# Patient Record
Sex: Female | Born: 1955 | Race: Black or African American | Hispanic: No | Marital: Married | Smoking: Never smoker
Health system: Southern US, Community
[De-identification: ages and names within clinical notes are randomized; demographics above are authoritative.]

## PROBLEM LIST (undated history)

## (undated) VITALS — BP 135/58 | HR 68 | Temp 97.2°F | Resp 18

## (undated) DIAGNOSIS — M810 Age-related osteoporosis without current pathological fracture: Secondary | ICD-10-CM

## (undated) DIAGNOSIS — M8580 Other specified disorders of bone density and structure, unspecified site: Secondary | ICD-10-CM

## (undated) DIAGNOSIS — D649 Anemia, unspecified: Secondary | ICD-10-CM

## (undated) DIAGNOSIS — Z87891 Personal history of nicotine dependence: Secondary | ICD-10-CM

## (undated) DIAGNOSIS — F419 Anxiety disorder, unspecified: Secondary | ICD-10-CM

## (undated) DIAGNOSIS — D689 Coagulation defect, unspecified: Secondary | ICD-10-CM

## (undated) DIAGNOSIS — E785 Hyperlipidemia, unspecified: Secondary | ICD-10-CM

## (undated) DIAGNOSIS — K219 Gastro-esophageal reflux disease without esophagitis: Secondary | ICD-10-CM

## (undated) DIAGNOSIS — F32A Depression, unspecified: Secondary | ICD-10-CM

## (undated) DIAGNOSIS — E119 Type 2 diabetes mellitus without complications: Secondary | ICD-10-CM

## (undated) DIAGNOSIS — E669 Obesity, unspecified: Secondary | ICD-10-CM

## (undated) DIAGNOSIS — Z9889 Other specified postprocedural states: Secondary | ICD-10-CM

## (undated) DIAGNOSIS — Z124 Encounter for screening for malignant neoplasm of cervix: Secondary | ICD-10-CM

## (undated) DIAGNOSIS — R112 Nausea with vomiting, unspecified: Secondary | ICD-10-CM

## (undated) DIAGNOSIS — Z789 Other specified health status: Secondary | ICD-10-CM

## (undated) HISTORY — DX: Personal history of nicotine dependence: Z87.891

## (undated) HISTORY — DX: Age-related osteoporosis without current pathological fracture: M81.0

## (undated) HISTORY — DX: Gastro-esophageal reflux disease without esophagitis: K21.9

## (undated) HISTORY — DX: Anemia, unspecified: D64.9

## (undated) HISTORY — DX: Other specified disorders of bone density and structure, unspecified site: M85.80

## (undated) HISTORY — DX: Depression, unspecified: F32.A

## (undated) HISTORY — PX: TOTAL ABDOMINAL HYSTERECTOMY: SHX209

## (undated) HISTORY — DX: Hyperlipidemia, unspecified: E78.5

## (undated) HISTORY — DX: Anxiety disorder, unspecified: F41.9

## (undated) HISTORY — DX: Coagulation defect, unspecified: D68.9

## (undated) HISTORY — PX: HYSTERECTOMY: SHX81

## (undated) HISTORY — DX: Obesity, unspecified: E66.9

## (undated) HISTORY — PX: OOPHORECTOMY: SHX86

## (undated) HISTORY — DX: Encounter for screening for malignant neoplasm of cervix: Z12.4

## (undated) NOTE — Progress Notes (Signed)
Formatting of this note is different from the original.  Images from the original note were not included.    Cardiology Consult     Patient: [name] [name]  DOB: [birth year] Age: 8 y.o.  PCP: Rae Lips    Problem List:     There is no problem list on file for this patient.    History of Present Illness:     Patient is a 35 y.o. African American female with HTN, Hyperlipidemia, nonobstructive CAD by LHC in 2010, breast cancer in 2021, presenting today for followup for BP check.  Patient states that her BPs have improved on her checks but still in thd 140s-150s systolic. She denies any further chest pains since stopping Spironolactone.  She is able to do her routine physical activities.  She denies SOB, N/V, diaphoresis, palpitations, orthopnea, PND, or LE swelling.    Past Medical History:     Past Medical History:   Diagnosis Date    Breast cancer 2021    s/p lumpectomy (left breast, stage Zero).  No chemotherapy or XRT.    Coronary artery disease     Nonobstructive CAD (LHC 2010)    Hypercholesterolemia     Hyperlipidemia     Hypertension      Past Surgical History:     Past Surgical History:   Procedure Laterality Date    BREAST LUMPECTOMY Left 2021    CARDIAC CATHETERIZATION  2010    Nonobstructive CAD.    COLONOSCOPY N/A 06/13/2022    COLONOSCOPY performed by Darlina Sicilian, MD at [facility] ENDOSCOPY    HYSTERECTOMY  04/2018    Uterine fibroids     Family History:     Family History   Problem Relation Age of Onset    Heart Attack Brother 49.00    Heart Attack Sister 53.00     Social History:     Social History     Socioeconomic History    Marital status: Married     Spouse name: None    Number of children: None    Years of education: None    Highest education level: None   Tobacco Use    Smoking status: Never    Smokeless tobacco: Never   Vaping Use    Vaping Use: Never used   Substance and Sexual Activity    Alcohol use: Yes     Comment: occas    Drug use: Never     Home Medications:     Current  Outpatient Medications   Medication Instructions    ascorbic acid (VITAMIN C) 1,000 mg, Oral    carvedilol (COREG) 25 mg, Oral, 2 TIMES DAILY WITH MEALS    hydroCHLOROthiazide (MICROZIDE) 12.5 mg, Oral, DAILY    losartan (COZAAR) 50 mg, Oral, DAILY    vitamin D3 (CHOLECALCIFEROL) 50 MCG (2000 UT) CAPS Oral     Allergies:     Allergies   Allergen Reactions    Aspirin Rash and Shortness Of Breath    Diphenhydramine Shortness Of Breath    Levofloxacin Other (See Comments)     Joint pains    Pravastatin Other (See Comments)     Myalgias    Tetanus Immune Globulin Other (See Comments)     Skin reaction    Atorvastatin Other (See Comments)    Other Swelling     Antihistamines    Spironolactone Other (See Comments)     pain    Tetanus-Diphth-Acell Pertussis Other (See Comments)     ok  with Tetanus but must have shot in left arm      Review of Systems:     Gastrointestinal ROS: no abdominal pain, change in bowel habits, or black or bloody stools  Genito-Urinary ROS: no dysuria, trouble voiding, or hematuria  Hematological and Lymphatic ROS: negative  Endocrine ROS: negative  Neurological ROS: no TIA or stroke symptoms  Otherwise negative    Physical Exam:     Vital Signs: Visit Vitals  BP (!) 154/84 (BP Location: Left arm, Patient Position: Sitting, BP Cuff Size: Adult)   Pulse 76   Resp 16   Ht 1.626 m (5\' 4" )   Wt 98 kg (216 lb)   SpO2 97%   BMI 37.08 kg/m     Constitutional:  No acute distress, non-toxic appearance  Neurologic:  Alert, orient x 3.  Psychiatric:  No apparent distress, not anxious.  Cardiovascular:  Regular rate and rhythm. Normal S1/S2, no S3/S4.  1/6 systolic murmur.  No rub.  Respiratory:  Clear to auscultation.  Extremities:  No edema    ECG:   Results for orders placed or performed in visit on 04/17/22   EKG 12 Lead    Impression    Normal sinus rhythm, nonspecific T wave abnormality.     Echo 09/2021:  Normal LV systolic function with estimated EF of 60-65%.   Trace tricuspid regurgitation with  normal estimated RV systolic pressures at 32 mmHg.    Grade 1 diastolic dysfunction.  Compared to echo report done on 10/01/2019, no signficant changes are identified.    Nuclear stress 09/2021:    Assessment:     1. ASCVD (arteriosclerotic cardiovascular disease)    2. Essential hypertension    3. Hyperlipidemia, unspecified hyperlipidemia type      Plan:     Patient's BPs are slightly better but still mildly elevated with Losartan HCTZ additions.  She denies any further chest pains since stopping Spironolactone.  I will increase Losartan to 50mg  qday, and she will continue Carvedilol, and HCTZ.  I will hold off on an ischemic workup at this point since she is asymptomatic.  Continue risk factor modification.  She is encouraged to monitor and record her BPs for review.  We will recheck echo in 4 months to reassess LV systolic function and valvular abnormalities.      Followup:     Followup with me in 4 months, after echo.    Liana Crocker, MD  [facility]  Electronically signed 12/19/2022 / 15:33    Electronically signed by Liana Crocker, MD at 12/19/2022  3:33 PM EDT

---

## 1982-07-15 HISTORY — PX: TUBAL LIGATION: SHX77

## 1994-08-22 ENCOUNTER — Ambulatory Visit
Admit: 1994-08-22 | Disposition: A | Discharge status: Home / Self Care | Payer: Self-pay | Source: Ambulatory Visit | Admitting: Adult Medicine

## 1995-12-03 ENCOUNTER — Ambulatory Visit
Admit: 1995-12-03 | Disposition: A | Discharge status: Home / Self Care | Payer: Self-pay | Source: Ambulatory Visit | Admitting: Adult Medicine

## 1996-03-17 ENCOUNTER — Ambulatory Visit
Admit: 1996-03-17 | Disposition: A | Discharge status: Home / Self Care | Payer: Self-pay | Source: Ambulatory Visit | Admitting: Obstetrics & Gynecology

## 1997-07-15 HISTORY — PX: ABDOMINAL SURGERY: SHX537

## 1998-01-31 ENCOUNTER — Encounter: Admission: RE | Admit: 1998-01-31 | Discharge: 1998-05-01 | Payer: Self-pay | Admitting: Family Medicine

## 1998-05-03 ENCOUNTER — Emergency Department: Admit: 1998-05-03 | Payer: Self-pay | Source: Emergency Department | Admitting: Emergency Medicine

## 1998-07-15 DIAGNOSIS — D689 Coagulation defect, unspecified: Secondary | ICD-10-CM

## 1998-07-15 HISTORY — DX: Coagulation defect, unspecified: D68.9

## 1999-03-31 ENCOUNTER — Emergency Department (HOSPITAL_COMMUNITY): Admission: EM | Admit: 1999-03-31 | Discharge: 1999-03-31 | Payer: Self-pay | Admitting: Emergency Medicine

## 1999-10-13 ENCOUNTER — Ambulatory Visit
Admit: 1999-10-13 | Disposition: A | Discharge status: Home / Self Care | Payer: Self-pay | Source: Ambulatory Visit | Admitting: Obstetrics & Gynecology

## 1999-12-10 ENCOUNTER — Emergency Department (HOSPITAL_COMMUNITY): Admission: EM | Admit: 1999-12-10 | Discharge: 1999-12-10 | Payer: Self-pay | Admitting: Emergency Medicine

## 1999-12-10 ENCOUNTER — Encounter: Payer: Self-pay | Admitting: Emergency Medicine

## 2000-04-14 ENCOUNTER — Emergency Department (HOSPITAL_COMMUNITY): Admission: EM | Admit: 2000-04-14 | Discharge: 2000-04-14 | Payer: Self-pay | Admitting: Emergency Medicine

## 2000-08-13 ENCOUNTER — Other Ambulatory Visit: Admission: RE | Admit: 2000-08-13 | Discharge: 2000-08-13 | Payer: Self-pay | Admitting: Obstetrics and Gynecology

## 2001-09-25 ENCOUNTER — Other Ambulatory Visit: Admission: RE | Admit: 2001-09-25 | Discharge: 2001-09-25 | Payer: Self-pay | Admitting: Obstetrics and Gynecology

## 2002-01-01 ENCOUNTER — Ambulatory Visit
Admit: 2002-01-01 | Disposition: A | Discharge status: Home / Self Care | Payer: Self-pay | Source: Ambulatory Visit | Admitting: Obstetrics & Gynecology

## 2002-01-02 ENCOUNTER — Ambulatory Visit
Admit: 2002-01-02 | Disposition: A | Discharge status: Home / Self Care | Payer: Self-pay | Source: Ambulatory Visit | Admitting: Obstetrics & Gynecology

## 2002-01-09 ENCOUNTER — Ambulatory Visit
Admit: 2002-01-09 | Disposition: A | Discharge status: Home / Self Care | Payer: Self-pay | Source: Ambulatory Visit | Admitting: Obstetrics & Gynecology

## 2002-03-20 ENCOUNTER — Ambulatory Visit: Admit: 2002-03-20 | Disposition: A | Discharge status: Home / Self Care | Payer: Self-pay | Source: Ambulatory Visit

## 2002-07-15 DIAGNOSIS — I10 Essential (primary) hypertension: Secondary | ICD-10-CM

## 2002-07-15 HISTORY — DX: Essential (primary) hypertension: I10

## 2002-07-15 HISTORY — PX: INGUINAL HERNIA REPAIR: SUR1180

## 2002-09-02 ENCOUNTER — Inpatient Hospital Stay
Admission: EM | Admit: 2002-09-02 | Disposition: A | Discharge status: Home / Self Care | Payer: Self-pay | Source: Emergency Department | Admitting: Family Medicine

## 2003-06-08 ENCOUNTER — Ambulatory Visit
Admit: 2003-06-08 | Disposition: A | Discharge status: Home / Self Care | Payer: Self-pay | Source: Ambulatory Visit | Admitting: Obstetrics & Gynecology

## 2003-07-27 ENCOUNTER — Other Ambulatory Visit: Admission: RE | Admit: 2003-07-27 | Discharge: 2003-07-27 | Payer: Self-pay | Admitting: Obstetrics and Gynecology

## 2003-09-01 ENCOUNTER — Ambulatory Visit
Admit: 2003-09-01 | Disposition: A | Discharge status: Home / Self Care | Payer: Self-pay | Source: Ambulatory Visit | Admitting: Obstetrics & Gynecology

## 2004-05-08 ENCOUNTER — Ambulatory Visit
Admit: 2004-05-08 | Disposition: A | Discharge status: Home / Self Care | Payer: Self-pay | Source: Ambulatory Visit | Admitting: Obstetrics & Gynecology

## 2005-01-09 ENCOUNTER — Other Ambulatory Visit: Admission: RE | Admit: 2005-01-09 | Discharge: 2005-01-09 | Payer: Self-pay | Admitting: Obstetrics and Gynecology

## 2005-04-26 ENCOUNTER — Ambulatory Visit: Payer: Self-pay | Admitting: Internal Medicine

## 2005-05-27 ENCOUNTER — Ambulatory Visit: Payer: Self-pay | Admitting: Internal Medicine

## 2005-05-29 ENCOUNTER — Ambulatory Visit: Payer: Self-pay | Admitting: Internal Medicine

## 2005-08-12 ENCOUNTER — Ambulatory Visit: Payer: Self-pay | Admitting: Internal Medicine

## 2006-01-10 ENCOUNTER — Ambulatory Visit
Admit: 2006-01-10 | Disposition: A | Discharge status: Home / Self Care | Payer: Self-pay | Source: Ambulatory Visit | Admitting: Obstetrics and Gynecology

## 2006-03-18 ENCOUNTER — Ambulatory Visit: Payer: Self-pay | Admitting: Internal Medicine

## 2006-11-12 ENCOUNTER — Ambulatory Visit: Payer: Self-pay | Admitting: Internal Medicine

## 2007-03-18 ENCOUNTER — Ambulatory Visit: Payer: Self-pay | Admitting: Gastroenterology

## 2007-04-01 ENCOUNTER — Encounter: Payer: Self-pay | Admitting: Gastroenterology

## 2007-04-01 ENCOUNTER — Ambulatory Visit: Payer: Self-pay | Admitting: Gastroenterology

## 2007-04-01 LAB — HM COLONOSCOPY: HM Colonoscopy: ABNORMAL

## 2007-06-08 ENCOUNTER — Ambulatory Visit: Payer: Self-pay | Admitting: Internal Medicine

## 2007-06-08 DIAGNOSIS — R233 Spontaneous ecchymoses: Secondary | ICD-10-CM | POA: Insufficient documentation

## 2007-06-08 DIAGNOSIS — E785 Hyperlipidemia, unspecified: Secondary | ICD-10-CM | POA: Insufficient documentation

## 2007-06-12 DIAGNOSIS — I82409 Acute embolism and thrombosis of unspecified deep veins of unspecified lower extremity: Secondary | ICD-10-CM | POA: Insufficient documentation

## 2007-06-12 DIAGNOSIS — D649 Anemia, unspecified: Secondary | ICD-10-CM | POA: Insufficient documentation

## 2007-06-12 DIAGNOSIS — F17200 Nicotine dependence, unspecified, uncomplicated: Secondary | ICD-10-CM | POA: Insufficient documentation

## 2007-06-15 LAB — CONVERTED CEMR LAB
Calcium: 9.5 mg/dL (ref 8.4–10.5)
Cholesterol: 305 mg/dL (ref 0–200)
Eosinophils Absolute: 0.1 10*3/uL (ref 0.0–0.6)
Eosinophils Relative: 1.4 % (ref 0.0–5.0)
GFR calc Af Amer: 85 mL/min
GFR calc non Af Amer: 70 mL/min
Glucose, Bld: 96 mg/dL (ref 70–99)
HDL: 72.2 mg/dL (ref >39.0)
Lymphocytes Relative: 51.5 % — ABNORMAL HIGH (ref 12.0–46.0)
MCV: 92.4 fL (ref 78.0–100.0)
Monocytes Relative: 10.2 % (ref 3.0–11.0)
Neutro Abs: 1.5 10*3/uL (ref 1.4–7.7)
Platelets: 306 10*3/uL (ref 150–400)
TSH: 0.59 microintl units/mL (ref 0.35–5.50)
Total CHOL/HDL Ratio: 4.2
Triglycerides: 50 mg/dL (ref 0–149)
WBC: 4.2 10*3/uL — ABNORMAL LOW (ref 4.5–10.5)

## 2007-07-02 ENCOUNTER — Telehealth: Payer: Self-pay | Admitting: Internal Medicine

## 2007-07-02 ENCOUNTER — Ambulatory Visit: Payer: Self-pay | Admitting: Internal Medicine

## 2007-07-10 ENCOUNTER — Emergency Department (HOSPITAL_COMMUNITY): Admission: EM | Admit: 2007-07-10 | Discharge: 2007-07-10 | Payer: Self-pay | Admitting: Emergency Medicine

## 2007-09-18 ENCOUNTER — Ambulatory Visit
Admit: 2007-09-18 | Disposition: A | Discharge status: Home / Self Care | Payer: Self-pay | Source: Ambulatory Visit | Admitting: Obstetrics and Gynecology

## 2007-10-29 ENCOUNTER — Ambulatory Visit: Payer: Self-pay | Admitting: Internal Medicine

## 2007-10-29 LAB — CONVERTED CEMR LAB
ALT: 13 units/L (ref 0–35)
AST: 17 units/L (ref 0–37)
LDL Cholesterol: 102 mg/dL — ABNORMAL HIGH (ref 0–99)
VLDL: 9 mg/dL (ref 0–40)

## 2007-10-30 ENCOUNTER — Ambulatory Visit: Payer: Self-pay | Admitting: Internal Medicine

## 2008-07-15 DIAGNOSIS — I517 Cardiomegaly: Secondary | ICD-10-CM

## 2008-07-15 DIAGNOSIS — I2510 Atherosclerotic heart disease of native coronary artery without angina pectoris: Secondary | ICD-10-CM

## 2008-07-15 HISTORY — PX: CARDIAC CATHETERIZATION: SHX172

## 2008-07-15 HISTORY — DX: Cardiomegaly: I51.7

## 2008-07-15 HISTORY — DX: Atherosclerotic heart disease of native coronary artery without angina pectoris: I25.10

## 2008-10-13 ENCOUNTER — Ambulatory Visit: Payer: Self-pay | Admitting: Internal Medicine

## 2008-10-13 DIAGNOSIS — L680 Hirsutism: Secondary | ICD-10-CM | POA: Insufficient documentation

## 2008-10-13 DIAGNOSIS — R109 Unspecified abdominal pain: Secondary | ICD-10-CM | POA: Insufficient documentation

## 2008-10-19 ENCOUNTER — Ambulatory Visit: Payer: Self-pay | Admitting: Internal Medicine

## 2008-10-19 LAB — CONVERTED CEMR LAB
Albumin: 4.2 g/dL (ref 3.5–5.2)
BUN: 9 mg/dL (ref 6–23)
Basophils Absolute: 0 10*3/uL (ref 0.0–0.1)
Calcium: 9.8 mg/dL (ref 8.4–10.5)
Cholesterol: 193 mg/dL (ref 0–200)
Creatinine, Ser: 0.9 mg/dL (ref 0.4–1.2)
Eosinophils Relative: 1.6 % (ref 0.0–5.0)
GFR calc non Af Amer: 84.4 mL/min (ref >60)
Glucose, Bld: 93 mg/dL (ref 70–99)
HCT: 36.5 % (ref 36.0–46.0)
HDL: 71.2 mg/dL (ref >39.00)
Lymphs Abs: 1.7 10*3/uL (ref 0.7–4.0)
MCV: 93.7 fL (ref 78.0–100.0)
Monocytes Absolute: 0.2 10*3/uL (ref 0.1–1.0)
Neutro Abs: 1.4 10*3/uL (ref 1.4–7.7)
Platelets: 268 10*3/uL (ref 150.0–400.0)
Prolactin: 4.8 ng/mL
RDW: 13.6 % (ref 11.5–14.6)
VLDL: 10.2 mg/dL (ref 0.0–40.0)

## 2008-10-20 ENCOUNTER — Encounter: Payer: Self-pay | Admitting: Internal Medicine

## 2008-10-31 ENCOUNTER — Observation Stay
Admission: EM | Admit: 2008-10-31 | Disposition: A | Discharge status: Other Acute Inpatient Hospital | Payer: Self-pay | Source: Emergency Department | Admitting: Internal Medicine

## 2008-10-31 LAB — HEPATIC FUNCTION PANEL
ALT: 22 U/L (ref 7–56)
AST (SGOT): 29 U/L (ref 5–40)
Albumin/Globulin Ratio: 1.2 (ref 1.1–1.8)
Albumin: 4.4 G/DL (ref 3.7–5.1)
Alkaline Phosphatase: 72 U/L (ref 43–122)
Bilirubin Direct: 0.2 MG/DL (ref 0.0–0.3)
Bilirubin Indirect: 0.3 MG/DL (ref 0.0–1.1)
Bilirubin, Total: 0.5 MG/DL (ref 0.2–1.3)
Globulin: 3.6 G/DL (ref 2.0–3.7)
Protein, Total: 8 G/DL (ref 6.0–8.0)

## 2008-10-31 LAB — BASIC METABOLIC PANEL
BUN: 11 MG/DL (ref 7–21)
CO2: 29 MEQ/L (ref 22–31)
Calcium: 10.1 MG/DL (ref 8.6–10.2)
Chloride: 106 MEQ/L (ref 98–107)
Creatinine: 0.9 MG/DL (ref 0.5–1.4)
Glucose: 112 MG/DL — ABNORMAL HIGH (ref 65–110)
Potassium: 4.3 MEQ/L (ref 3.6–5.0)
Sodium: 142 MEQ/L (ref 136–143)

## 2008-10-31 LAB — CBC AND DIFFERENTIAL
Basophils Absolute: 0 /mm3 (ref 0.0–0.2)
Basophils: 1 % (ref 0–2)
Eosinophils Absolute: 0.1 /mm3 (ref 0.0–0.7)
Eosinophils: 3 % (ref 0–5)
Granulocytes Absolute: 2.4 /mm3 (ref 1.8–8.1)
Hematocrit: 31.9 % — ABNORMAL LOW (ref 37.0–47.0)
Hgb: 9.9 G/DL — ABNORMAL LOW (ref 12.0–16.0)
Immature Granulocytes Absolute: 0.1 CUMM — ABNORMAL HIGH (ref 0.0–0.0)
Immature Granulocytes: 2 % — ABNORMAL HIGH (ref 0–1)
Lymphocytes Absolute: 1.2 /mm3 (ref 0.5–4.4)
Lymphocytes: 29 % (ref 15–41)
MCH: 23.2 PG — ABNORMAL LOW (ref 28.0–32.0)
MCHC: 31 G/DL — ABNORMAL LOW (ref 32.0–36.0)
MCV: 74.9 FL — ABNORMAL LOW (ref 80.0–100.0)
MPV: 12.1 FL (ref 9.4–12.3)
Monocytes Absolute: 0.3 /mm3 (ref 0.0–1.2)
Monocytes: 8 % (ref 0–11)
Neutrophils %: 60 % (ref 52–75)
Platelets: 184 /mm3 (ref 140–400)
RBC: 4.26 /mm3 (ref 4.20–5.40)
RDW: 15.5 % — ABNORMAL HIGH (ref 11.5–15.0)
WBC: 4.06 /mm3 (ref 3.50–10.80)

## 2008-10-31 LAB — GFR

## 2008-10-31 LAB — PT/INR
PT INR: 1.2 {INR} — ABNORMAL HIGH (ref 0.9–1.1)
PT: 13.6 s — ABNORMAL HIGH (ref 10.8–13.3)

## 2008-10-31 LAB — TROPONIN I QUANTITATIVE LEVEL - IFOH CERNER
Troponin I Quant: <0.01 NG/ML
Troponin I Quant: <0.01 NG/ML

## 2008-10-31 LAB — D-DIMER (DVT) CERNER: D-Dimer (DVT): 748 ng mL — ABNORMAL HIGH (ref 0–600)

## 2008-10-31 LAB — LIPASE: Lipase: 56 U/L (ref 23–300)

## 2008-10-31 LAB — CK: Creatine Kinase (CK): 76 U/L (ref 20–140)

## 2008-10-31 LAB — CREATINE KINASE W/O REFLEX (SOFT): Creatine Kinase (CK): 53 U/L (ref 20–140)

## 2008-11-01 ENCOUNTER — Ambulatory Visit: Payer: Self-pay | Admitting: Internal Medicine

## 2008-11-01 ENCOUNTER — Ambulatory Visit: Payer: Self-pay | Admitting: Diagnostic Radiology

## 2008-11-01 ENCOUNTER — Telehealth: Payer: Self-pay | Admitting: Internal Medicine

## 2008-11-01 ENCOUNTER — Encounter: Payer: Self-pay | Admitting: Internal Medicine

## 2008-11-01 ENCOUNTER — Ambulatory Visit (HOSPITAL_BASED_OUTPATIENT_CLINIC_OR_DEPARTMENT_OTHER): Admission: RE | Admit: 2008-11-01 | Discharge: 2008-11-01 | Payer: Self-pay | Admitting: Internal Medicine

## 2008-11-01 LAB — TROPONIN I QUANTITATIVE LEVEL - IFOH CERNER: Troponin I Quant: <0.01 NG/ML

## 2008-11-01 LAB — CREATINE KINASE W/O REFLEX (SOFT): Creatine Kinase (CK): 44 U/L (ref 20–140)

## 2008-11-02 ENCOUNTER — Inpatient Hospital Stay
Admission: AD | Admit: 2008-11-02 | Disposition: A | Discharge status: Home / Self Care | Payer: Self-pay | Source: Other Acute Inpatient Hospital | Admitting: Specialist

## 2008-11-03 LAB — T4, FREE: T4 Free: 1.24 ng/dL (ref 0.70–1.48)

## 2008-11-03 LAB — TSH: TSH: 0.415 u[IU]/mL (ref 0.350–4.940)

## 2008-11-03 LAB — IRON PROFILE
Iron Saturation: 8 % — ABNORMAL LOW (ref 15–50)
Iron: 30 ug/dL — ABNORMAL LOW (ref 37–171)
TIBC: 358 ug/dL (ref 265–497)

## 2008-11-03 LAB — FERRITIN: Ferritin: 30.2 ng/mL (ref 4.6–204.0)

## 2008-11-03 LAB — RETICULOCYTE AUTO CERNER
Immature Platelet Fraction: 10.2 % (ref 0.9–11.2)
Immature Retic Fract: 32.3 % — ABNORMAL HIGH (ref 3.0–15.9)
Retic %: 1.8 % (ref 0.5–2.5)
Retic: 68.8 /mm3 (ref 21.00–135.00)
Reticulocyte Hemoglobin: 25.8 PG — ABNORMAL LOW (ref 28.2–36.6)

## 2008-11-03 LAB — LACTATE DEHYDROGENASE: LDH: 590 U/L — ABNORMAL HIGH (ref 307–575)

## 2008-11-03 LAB — T3, FREE: T3, Free: 2.41 pg/mL (ref 1.71–3.71)

## 2008-11-04 LAB — VITAMIN B12: Vitamin B-12: 473 pg/mL (ref 211–911)

## 2008-11-04 LAB — FOLATE: Folate: 17.1 ng/mL

## 2008-11-05 LAB — MISCELLANEOUS QUEST TEST: Test Info #3: 19548

## 2008-11-07 LAB — PROTEIN ELECTROPHORESIS, SERUM
Albumin %: 47.5 % (ref 46.6–62.6)
Albumin, Synovial: 3.8 g/dL (ref 3.4–4.8)
Alpha-1 Glob %: 3.5 % (ref 1.7–4.1)
Alpha-1 Globulin: 0.3 g/dL (ref 0.1–0.4)
Alpha-2 Glob %: 12.4 % (ref 8.9–14.9)
Alpha-2 Globulin: 1 g/dL (ref 0.8–1.2)
Beta Glob %: 13.6 % (ref 10.9–18.9)
Beta Globulin: 1.1 g/dL (ref 0.6–1.2)
Gamma Globulin %: 23.1 % (ref 9.8–24.4)
Gamma Globulin: 1.8 g/dL — ABNORMAL HIGH (ref 0.6–1.7)
Protein, Total: 8 g/dL (ref 6.0–8.3)

## 2008-11-07 LAB — URINE PROTEIN ELECTROPHORESIS, 24 HOUR
UR Prot Elec Albumin: 100 % (ref 100.0–100.0)
UR Prot Elect GaGlob: 0 % (ref 0.0–0.0)
UR Prot Elect Glob B: 0 % (ref 0.0–0.0)
UR Prot Elect GlobA1: 0 % (ref 0.0–0.0)
UR Prot Elect GlobA2: 0 % (ref 0.0–0.0)
UR Prot Elect Prealb: 0 % (ref 0.0–0.0)
UR Protein 24HR: 27 mg/dL — ABNORMAL HIGH (ref 1–14)

## 2008-11-08 LAB — MISCELLANEOUS QUEST TEST: Test Info #3: 19548

## 2008-11-08 LAB — ALDOSTERONE LEVEL CERNER

## 2008-11-09 LAB — RENIN, PERIPHERAL CERNER

## 2008-11-10 ENCOUNTER — Encounter: Payer: Self-pay | Admitting: Internal Medicine

## 2008-11-17 ENCOUNTER — Ambulatory Visit: Payer: Self-pay | Admitting: Internal Medicine

## 2008-11-17 DIAGNOSIS — K8020 Calculus of gallbladder without cholecystitis without obstruction: Secondary | ICD-10-CM | POA: Insufficient documentation

## 2008-11-17 DIAGNOSIS — N83209 Unspecified ovarian cyst, unspecified side: Secondary | ICD-10-CM | POA: Insufficient documentation

## 2008-11-17 DIAGNOSIS — E559 Vitamin D deficiency, unspecified: Secondary | ICD-10-CM | POA: Insufficient documentation

## 2009-01-05 ENCOUNTER — Telehealth: Payer: Self-pay | Admitting: Internal Medicine

## 2009-01-05 DIAGNOSIS — S91309A Unspecified open wound, unspecified foot, initial encounter: Secondary | ICD-10-CM | POA: Insufficient documentation

## 2009-01-05 DIAGNOSIS — S96909A Unspecified injury of unspecified muscle and tendon at ankle and foot level, unspecified foot, initial encounter: Secondary | ICD-10-CM | POA: Insufficient documentation

## 2009-01-05 DIAGNOSIS — S96999A Other specified injury of unspecified muscle and tendon at ankle and foot level, unspecified foot, initial encounter: Secondary | ICD-10-CM

## 2009-01-07 ENCOUNTER — Ambulatory Visit: Payer: Self-pay | Admitting: Family Medicine

## 2009-02-09 ENCOUNTER — Ambulatory Visit: Payer: Self-pay | Admitting: Internal Medicine

## 2009-02-09 DIAGNOSIS — M79609 Pain in unspecified limb: Secondary | ICD-10-CM | POA: Insufficient documentation

## 2009-02-10 ENCOUNTER — Telehealth: Payer: Self-pay | Admitting: Internal Medicine

## 2009-02-27 ENCOUNTER — Ambulatory Visit (HOSPITAL_BASED_OUTPATIENT_CLINIC_OR_DEPARTMENT_OTHER): Admission: RE | Admit: 2009-02-27 | Discharge: 2009-02-27 | Payer: Self-pay | Admitting: Internal Medicine

## 2009-02-27 ENCOUNTER — Telehealth: Payer: Self-pay | Admitting: Internal Medicine

## 2009-02-27 ENCOUNTER — Ambulatory Visit: Payer: Self-pay | Admitting: Diagnostic Radiology

## 2009-02-28 ENCOUNTER — Telehealth: Payer: Self-pay | Admitting: Internal Medicine

## 2009-04-26 ENCOUNTER — Ambulatory Visit
Admit: 2009-04-26 | Disposition: A | Discharge status: Home / Self Care | Payer: Self-pay | Source: Ambulatory Visit | Admitting: Obstetrics and Gynecology

## 2009-05-30 LAB — CONVERTED CEMR LAB

## 2009-06-16 ENCOUNTER — Ambulatory Visit: Payer: Self-pay | Admitting: Diagnostic Radiology

## 2009-06-16 ENCOUNTER — Ambulatory Visit (HOSPITAL_BASED_OUTPATIENT_CLINIC_OR_DEPARTMENT_OTHER): Admission: RE | Admit: 2009-06-16 | Discharge: 2009-06-16 | Payer: Self-pay | Admitting: Internal Medicine

## 2009-06-16 ENCOUNTER — Ambulatory Visit: Payer: Self-pay | Admitting: Internal Medicine

## 2009-06-16 DIAGNOSIS — R0602 Shortness of breath: Secondary | ICD-10-CM | POA: Insufficient documentation

## 2009-06-21 ENCOUNTER — Encounter: Payer: Self-pay | Admitting: Internal Medicine

## 2009-07-06 ENCOUNTER — Ambulatory Visit: Payer: Self-pay | Admitting: Internal Medicine

## 2009-07-10 ENCOUNTER — Encounter: Payer: Self-pay | Admitting: Internal Medicine

## 2009-07-18 ENCOUNTER — Ambulatory Visit: Payer: Self-pay | Admitting: Internal Medicine

## 2009-08-03 ENCOUNTER — Ambulatory Visit
Admit: 2009-08-03 | Disposition: A | Discharge status: Home / Self Care | Payer: Self-pay | Source: Ambulatory Visit | Admitting: Adult Health

## 2009-08-22 ENCOUNTER — Encounter: Payer: Self-pay | Admitting: Internal Medicine

## 2009-08-22 LAB — HM MAMMOGRAPHY: HM Mammogram: NORMAL

## 2009-09-05 ENCOUNTER — Ambulatory Visit: Payer: Self-pay | Admitting: Internal Medicine

## 2009-09-05 DIAGNOSIS — J069 Acute upper respiratory infection, unspecified: Secondary | ICD-10-CM | POA: Insufficient documentation

## 2009-12-13 ENCOUNTER — Telehealth: Payer: Self-pay | Admitting: Internal Medicine

## 2009-12-14 ENCOUNTER — Ambulatory Visit (HOSPITAL_BASED_OUTPATIENT_CLINIC_OR_DEPARTMENT_OTHER): Admission: RE | Admit: 2009-12-14 | Discharge: 2009-12-14 | Payer: Self-pay | Admitting: Internal Medicine

## 2009-12-14 ENCOUNTER — Ambulatory Visit: Payer: Self-pay | Admitting: Internal Medicine

## 2009-12-14 ENCOUNTER — Ambulatory Visit: Payer: Self-pay | Admitting: Diagnostic Radiology

## 2009-12-14 DIAGNOSIS — M940 Chondrocostal junction syndrome [Tietze]: Secondary | ICD-10-CM | POA: Insufficient documentation

## 2009-12-15 ENCOUNTER — Encounter: Payer: Self-pay | Admitting: Internal Medicine

## 2009-12-15 ENCOUNTER — Telehealth: Payer: Self-pay | Admitting: Internal Medicine

## 2010-02-12 ENCOUNTER — Encounter: Payer: Self-pay | Admitting: Internal Medicine

## 2010-03-01 ENCOUNTER — Ambulatory Visit: Payer: Self-pay | Admitting: Internal Medicine

## 2010-03-01 LAB — CONVERTED CEMR LAB
Alkaline Phosphatase: 70 units/L (ref 39–117)
BUN: 18 mg/dL (ref 6–23)
Bilirubin, Direct: 0.1 mg/dL (ref 0.0–0.3)
Chloride: 102 meq/L (ref 96–112)
Creatinine, Ser: 1.01 mg/dL (ref 0.40–1.20)
Glucose, Bld: 83 mg/dL (ref 70–99)
HCT: 39.3 % (ref 36.0–46.0)
Hemoglobin: 12.7 g/dL (ref 12.0–15.0)
Indirect Bilirubin: 0.4 mg/dL (ref 0.0–0.9)
LDL Cholesterol: 128 mg/dL — ABNORMAL HIGH (ref 0–99)
RBC: 4.18 M/uL (ref 3.87–5.11)
RDW: 14.3 % (ref 11.5–15.5)
Total Bilirubin: 0.5 mg/dL (ref 0.3–1.2)
Triglycerides: 54 mg/dL (ref <150)
VLDL: 11 mg/dL (ref 0–40)

## 2010-03-02 ENCOUNTER — Encounter: Payer: Self-pay | Admitting: Internal Medicine

## 2010-03-05 ENCOUNTER — Ambulatory Visit: Payer: Self-pay | Admitting: Internal Medicine

## 2010-03-07 ENCOUNTER — Encounter: Payer: Self-pay | Admitting: Internal Medicine

## 2010-03-28 ENCOUNTER — Ambulatory Visit: Payer: Self-pay | Admitting: Internal Medicine

## 2010-03-28 DIAGNOSIS — H00019 Hordeolum externum unspecified eye, unspecified eyelid: Secondary | ICD-10-CM | POA: Insufficient documentation

## 2010-03-28 DIAGNOSIS — R609 Edema, unspecified: Secondary | ICD-10-CM | POA: Insufficient documentation

## 2010-05-16 IMAGING — US US ABDOMEN COMPLETE
1 series · 14 of 25 positions shown · non-contrast
Comparison: None available.

CLINICAL DATA: Right side abdominal pain.

COMPLETE ABDOMINAL ULTRASOUND

[Series 1: us abdomen complete · 0.28mm/px · 14 of 72 slices shown]
[im 1/72]
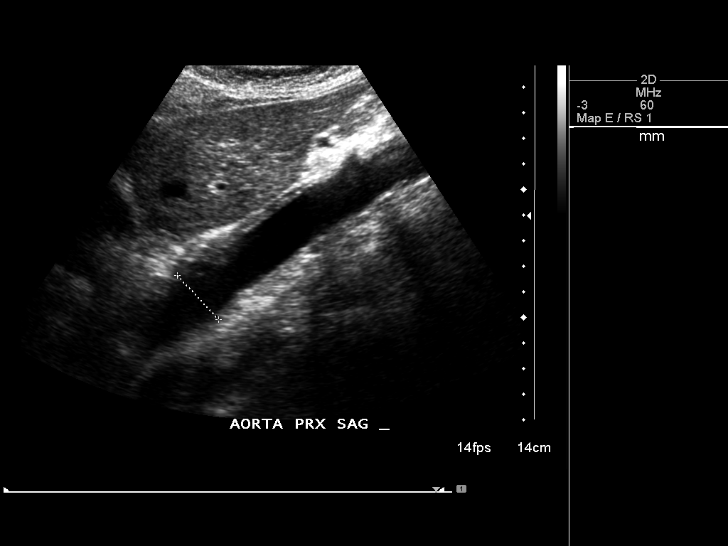
[im 6/72]
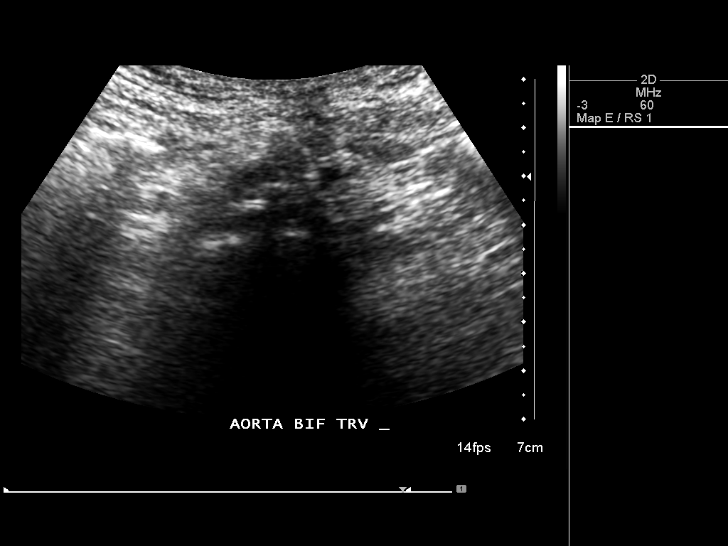
[im 12/72]
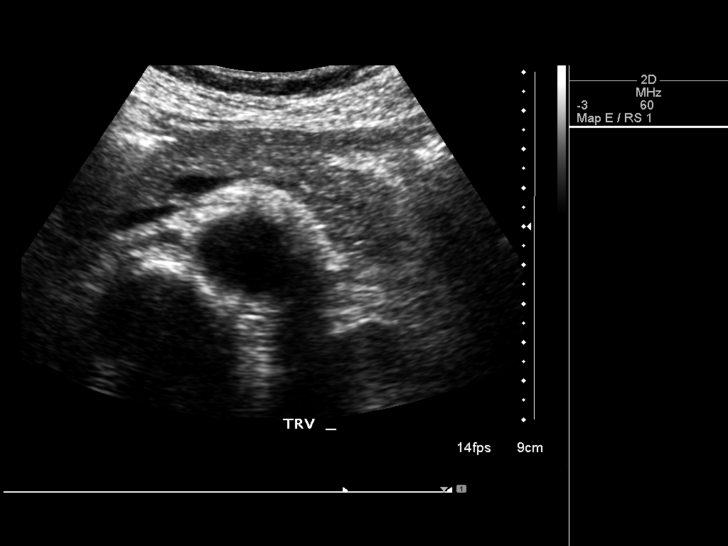
[im 18/72]
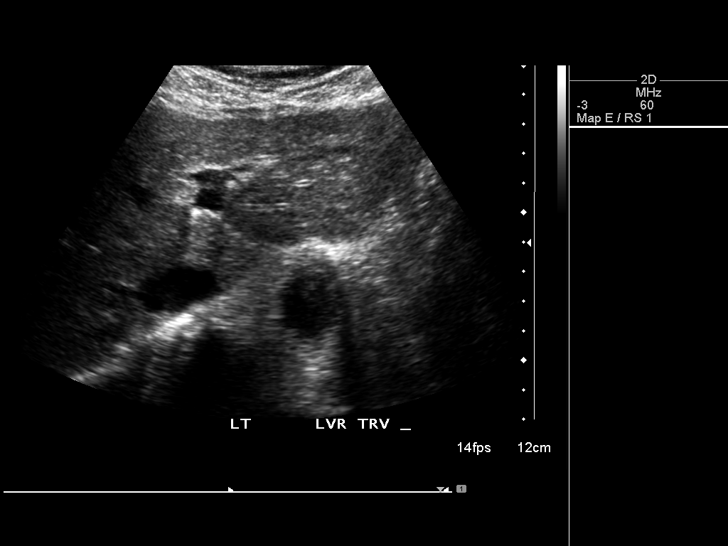
[im 24/72]
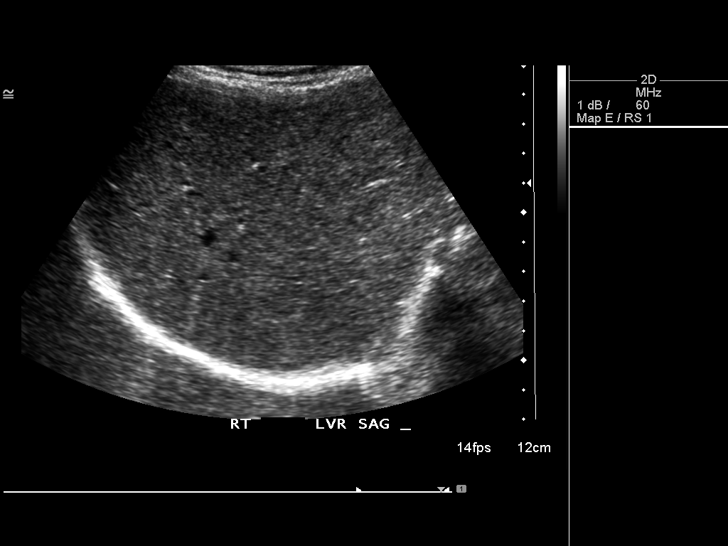
[im 27/72]
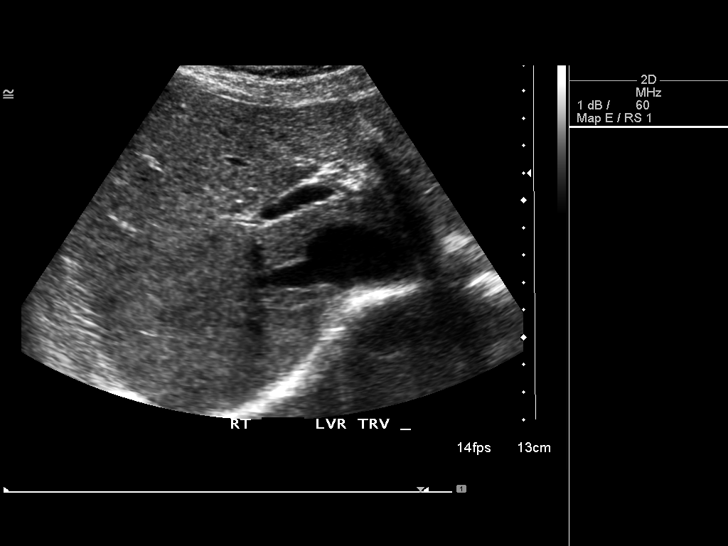
[im 33/72]
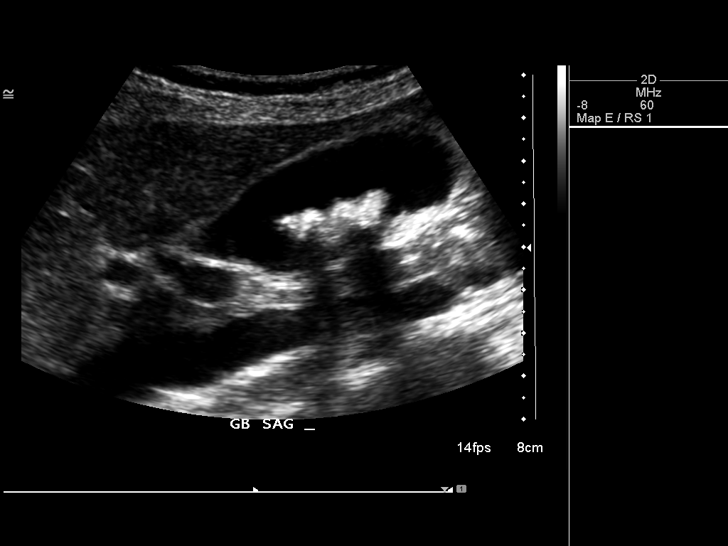
[im 39/72]
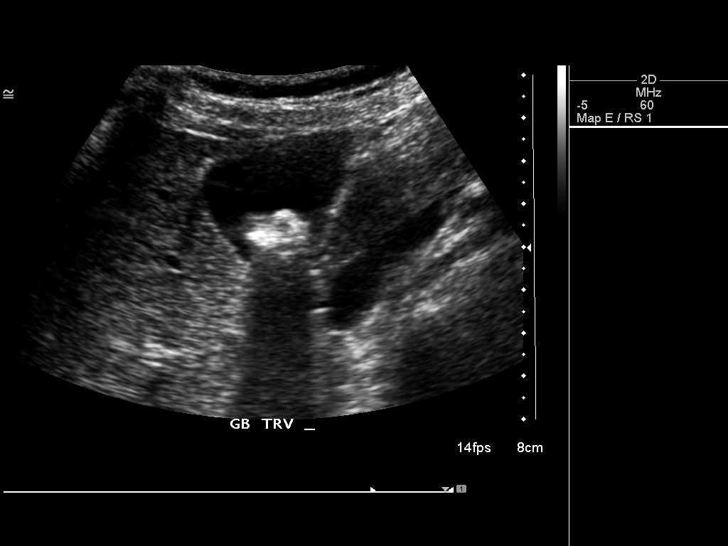
[im 45/72]
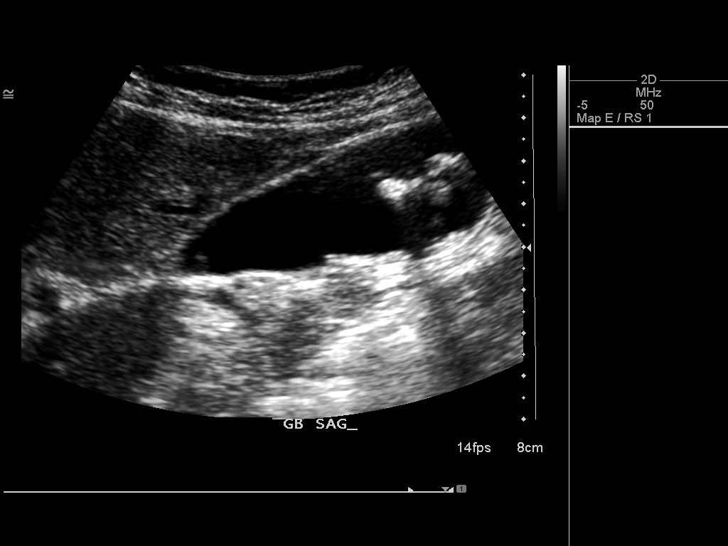
[im 48/72]
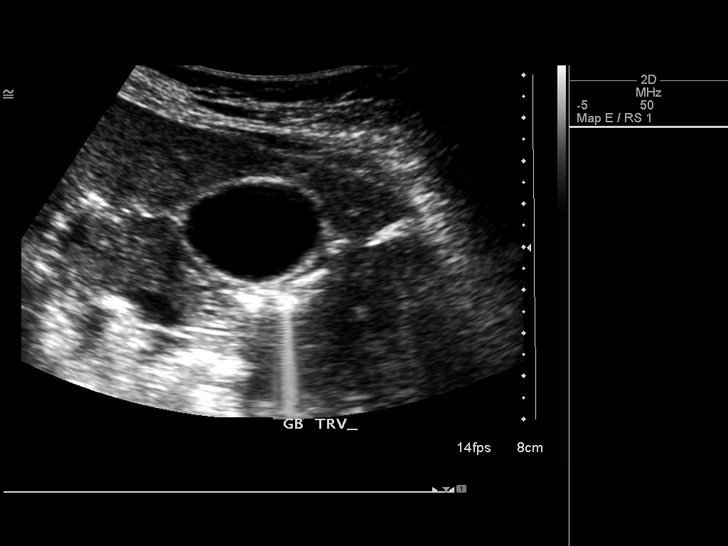
[im 54/72]
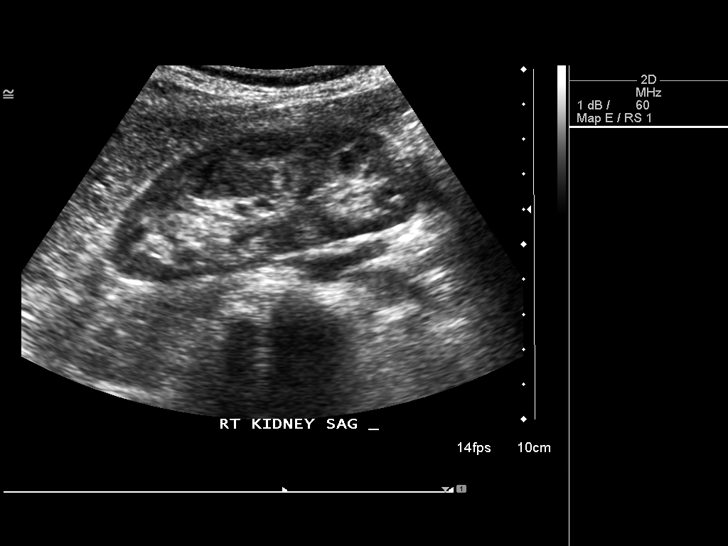
[im 60/72]
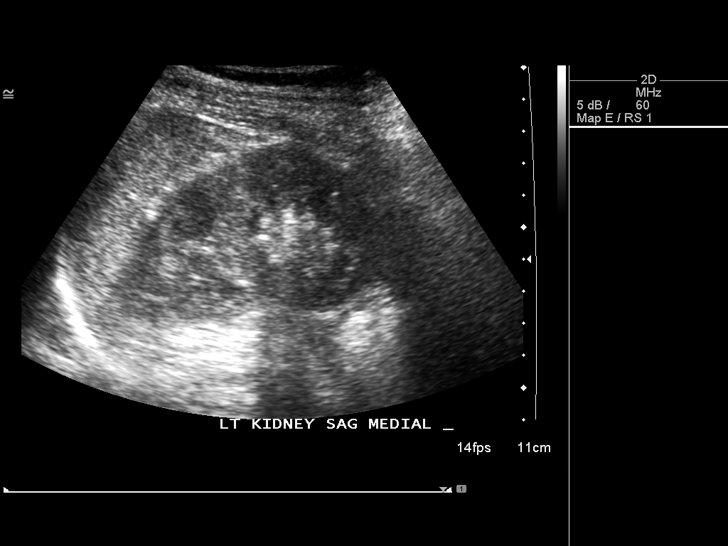
[im 66/72]
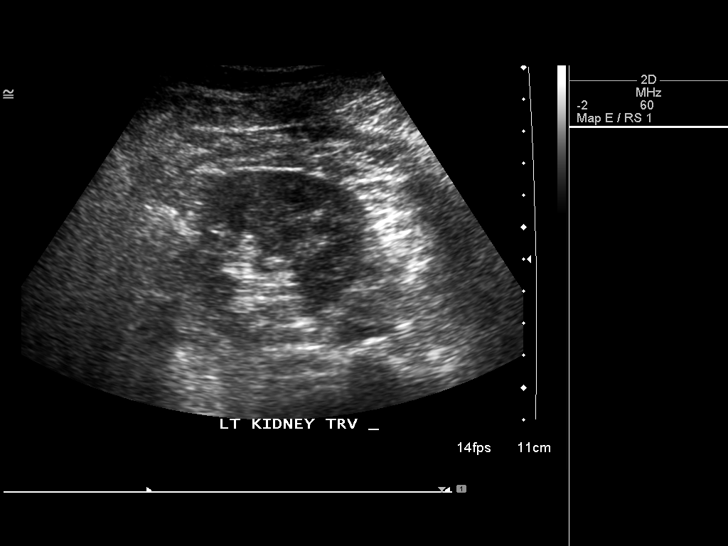
[im 72/72]
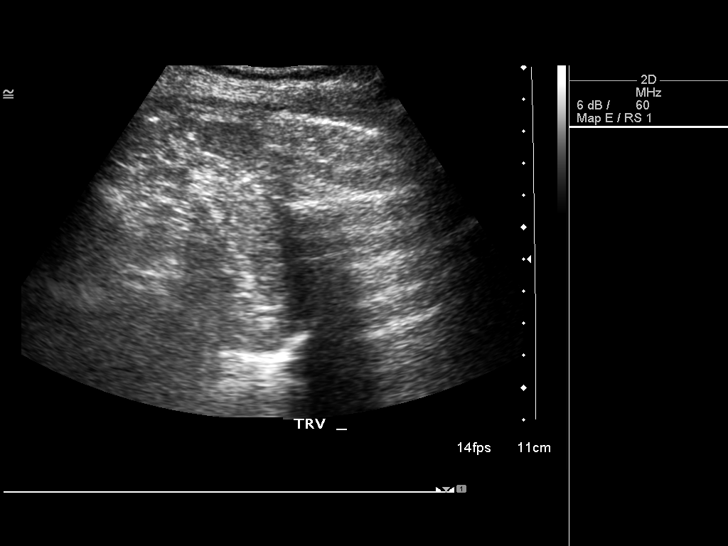

[14 of 25 positions shown; findings below may reference images not displayed]

FINDINGS: Gallbladder:  Several mobile stones are identified within the
gallbladder.  There is no pericholecystic fluid or wall thickening.
Sonographer reports negative Murphy's sign.

Common bile duct:  Normal measuring 1.7 mm.

Liver:  Appears normal

IVC:  Appears normal

Pancreas:  Appears normal

Spleen:  Appears normal.  No splenomegaly.

Right Kidney:  Measures 9.4 cm.  No stones, masses or
hydronephrosis.

Left Kidney:  Measures 9.4 cm.  No stones, masses or
hydronephrosis.

Abdominal aorta:  No abdominal aortic aneurysm.
IMPRESSION: Several mobile stones are present within the gallbladder but there
is no ultrasound evidence of cholecystitis.  Examination is
otherwise negative.

## 2010-08-05 ENCOUNTER — Encounter: Payer: Self-pay | Admitting: Internal Medicine

## 2010-08-05 ENCOUNTER — Encounter: Payer: Self-pay | Admitting: Obstetrics and Gynecology

## 2010-08-14 NOTE — Miscellaneous (Signed)
Summary: Mammogram  Clinical Lists Changes  Observations: Added new observation of MAMMOGRAM: normal (08/22/2009 16:39)        Preventive Care Screening  Mammogram:    Date:  08/22/2009    Results:  normal

## 2010-08-14 NOTE — Assessment & Plan Note (Signed)
Summary: swelling in ankle/dt   Vital Signs:  Patient profile:   55 year old female Menstrual status:  hysterectomy Height:      64 inches Weight:      134.75 pounds BMI:     23.21 O2 Sat:      100 % on Room air Temp:     97.9 degrees F oral Pulse rate:   68 / minute Pulse rhythm:   regular Resp:     16 per minute BP sitting:   114 / 70  (right arm) Cuff size:   regular  Vitals Entered By: Glendell Docker CMA (March 28, 2010 11:36 AM)  O2 Flow:  Room air CC: Left ankle swelling Is Patient Diabetic? No Pain Assessment Patient in pain? no        Primary Care Anjalina Bergevin:  Dondra Spry DO  CC:  Left ankle swelling.  History of Present Illness:  55 y/o AA female c/o LE swelling .  Left greater than right no leg redness or pain no chest pain or orthopnea  has stye on right eye  Preventive Screening-Counseling & Management  Alcohol-Tobacco     Smoking Status: quit  Allergies: 1)  ! Sulfa 2)  ! Pcn  Past History:  Past Medical History: Hyperlipidemia History of tobacco use           Past Surgical History: Hysterectomy        Family History: Mother - ovarian cancer 2008, osteoporosis            Social History: Former Smoker Alcohol use-no   Occupation:  [employer]   3 daughters 1 son Divorced  (husband had drug problem)   Physical Exam  General:  alert, well-developed, and well-nourished.   Eyes:  1-2 mm stye right eye Lungs:  normal respiratory effort, normal breath sounds, no crackles, and no wheezes.   Heart:  normal rate, regular rhythm, no murmur, and no gallop.   Extremities:  trace left pedal edema.     Impression & Recommendations:  Problem # 1:  EDEMA (ICD-782.3) mild LE swelling.  probable venous insuff.  use lasix as needed.   low salt diet encouraged  Her updated medication list for this problem includes:    Furosemide 20 Mg Tabs (Furosemide) .Marland Kitchen... 1/2 tab by mouth qam as needed for lower ext swelling  Problem # 2:   STYE (ICD-373.11) small right eye stye.  use warm compress.  Patient advised to call office if symptoms persist or worsen.  Complete Medication List: 1)  Lipitor 40 Mg Tabs (Atorvastatin calcium) .... One by mouth qd 2)  Vitamin D 1000 Unit Tabs (Cholecalciferol) .... Take 1 tablet by mouth once a day 3)  Voltaren 1 % Gel (Diclofenac sodium) .... Apply three times a day to affected area 4)  Furosemide 20 Mg Tabs (Furosemide) .... 1/2 tab by mouth qam as needed for lower ext swelling  Patient Instructions: 1)  Call our office if your symptoms do not  improve or gets worse. Prescriptions: FUROSEMIDE 20 MG TABS (FUROSEMIDE) 1/2 tab by mouth qam as needed for lower ext swelling  #30 x 3   Entered and Authorized by:   D. Thomos Lemons DO   Signed by:   D. Thomos Lemons DO on 03/28/2010   Method used:   [facility]  [address] [id number]       [address]       [place]  [place], [place]  [address]       Ph: [phone] or [phone]       Fax: [phone]   RxID:   [id number]    Orders Added: 1)  Est. Patient Level III [27253]   Current Allergies (reviewed today): ! SULFA ! PCN

## 2010-08-14 NOTE — Progress Notes (Signed)
Summary: Lipitor Refill  Phone Note Refill Request Message from:  Fax from Pharmacy on December 13, 2009 3:29 PM  Refills Requested: Medication #1:  LIPITOR 40 MG  TABS one by mouth qd   Brand Name Necessary? No   Supply Requested: 1 month   Last Refilled: 11/10/2009 Refill sent to pharmacy,patient is due for 6 month follow up, no appointment scheduled to date   Method Requested: Electronic Next Appointment Scheduled: None Initial call taken by: Glendell Docker CMA,  December 13, 2009 3:30 PM  Follow-up for Phone Call        attempted to contact patient at [phone] to schedule follow up appointment , no answer, voice recording reached stating mailbox is full unable to leave message Follow-up by: Glendell Docker CMA,  December 13, 2009 3:33 PM    Prescriptions: LIPITOR 40 MG  TABS (ATORVASTATIN CALCIUM) one by mouth qd  #30 x 0   Entered by:   Glendell Docker CMA   Authorized by:   D. Thomos Lemons DO   Signed by:   Glendell Docker CMA on 12/13/2009   Method used:   [facility]  [address] [id number]       [address]       [place], [place]  [address]       Ph: [phone] or [phone]       Fax: [phone]   RxID:   [id number]

## 2010-08-14 NOTE — Letter (Signed)
Summary: Medical Eval Form/[employer] [facility] [employer]  Medical Eval Form/[employer] [facility] [employer]   Imported By: Lanelle Bal 03/16/2010 08:50:28  _____________________________________________________________________  External Attachment:    Type:   Image     Comment:   External Document

## 2010-08-14 NOTE — Assessment & Plan Note (Signed)
Summary: PPD reading  Nurse Visit   Primary Care Provider:  Dondra Spry DO   History of Present Illness: CC:  TB skin test recheck  The patient presented after 48 hours to check the injection site for positive or negative reaction.  Injection site examination: No firm bump forms at the test site.  Slightly reddish appearance and diameter was smaller than 5mm.  Assessment & Plan: Negative TB skin test. Patient was counselled to call if experiences any irritation of site.  Nicki Guadal[place]ara Fergerson CMA Duncan Dull)  March 07, 2010 4:44 PM      Allergies: 1)  ! Sulfa 2)  ! Pcn

## 2010-08-14 NOTE — Assessment & Plan Note (Signed)
Summary: ppd/mhf  Nurse Visit   Allergies: 1)  ! Sulfa 2)  ! Pcn  Immunizations Administered:  PPD Skin Test:    Vaccine Type: PPD    Site: left forearm    Mfr: Sanofi Pasteur    Dose: 0.05 ml    Route: ID    Given by: Glendell Docker CMA    Exp. Date: 05/17/2011    Lot #: [id number]  Orders Added: 1)  TB Skin Test [86580] 2)  Admin 1st Vaccine [[address]]

## 2010-08-14 NOTE — Assessment & Plan Note (Signed)
Summary: BREAST PAIN/HEA   Vital Signs:  Patient profile:   55 year old female Weight:      138.25 pounds Temp:     97.5 degrees F oral Pulse rate:   74 / minute Pulse rhythm:   regular Resp:     18 per minute BP sitting:   128 / 80  (right arm) Cuff size:   regular  Vitals Entered By: Glendell Docker CMA (December 14, 2009 4:23 PM) CC: Rm 2- Breast Pain Comments Breast pain / chest wall pain   Primary Care Provider:  Dondra Spry DO  CC:  Rm 2- Breast Pain.  History of Present Illness: 55 y/o AA female co chest wall pain symptoms started approx 2 months ago she attributes to her prev mammo ( mammo was normal ) symptoms worse with laying ache along right sternum she has URI 1 month ago no pain with deep inspiration no shortness of breath  Allergies: 1)  ! Sulfa 2)  ! Pcn  Past History:  Past Medical History: Hyperlipidemia History of tobacco use         Family History: Mother - ovarian cancer 2008, osteoporosis          Social History: Former Smoker Alcohol use-no   Occupation:  [employer]        Physical Exam  General:  alert, well-developed, and well-nourished.   Chest Wall:  right chest wall tenderness (costochondrol junction, no mass) Lungs:  normal respiratory effort and normal breath sounds.   Heart:  normal rate, regular rhythm, and no gallop.     Impression & Recommendations:  Problem # 1:  COSTOCHONDRITIS (ICD-733.6) pt with right sided chest wall pain. use advil as needed.  samples of voltaren gel provided.  Patient advised to call office if symptoms persist or worsen.  Complete Medication List: 1)  Lipitor 40 Mg Tabs (Atorvastatin calcium) .... One by mouth qd 2)  Vitamin D 1000 Unit Tabs (Cholecalciferol) .... Take 1 tablet by mouth once a day 3)  Voltaren 1 % Gel (Diclofenac sodium) .... Apply three times a day to affected area  Patient Instructions: 1)  Call our office if your symptoms do not  improve or gets worse.  Current  Allergies (reviewed today): ! SULFA ! PCN

## 2010-08-14 NOTE — Assessment & Plan Note (Signed)
Summary: sore throat/mhf   Vital Signs:  Patient profile:   55 year old female Weight:      137.50 pounds BMI:     23.69 Temp:     97.8 degrees F oral Pulse rate:   76 / minute Pulse rhythm:   regular Resp:     18 per minute BP sitting:   130 / 80  (right arm) Cuff size:   regular  Vitals Entered By: Glendell Docker CMA (September 05, 2009 4:10 PM) CC: RM 3-Throat pain, URI symptoms Comments c/o thorat pain and upper chest discomfort for the past  4-5 days, dry cough and tickle in throat at night, sneezing off and on & fatigue   Primary Care Provider:  Dondra Spry DO  CC:  RM 3-Throat pain and URI symptoms.  History of Present Illness:  URI Symptoms      This is a 55 year old woman who presents with URI symptoms.  The patient reports nasal congestion, sore throat, and dry cough.  The patient denies fever.  no muscle aches  Allergies: 1)  ! Sulfa 2)  ! Pcn  Past History:  Past Medical History: Hyperlipidemia History of tobacco use        Family History: Mother - ovarian cancer 2008, osteoporosis         Social History: Former Smoker Alcohol use-no   Occupation:  [employer]       Physical Exam  General:  alert, well-developed, and well-nourished.   Ears:  R ear normal and L ear normal.   Mouth:  pharyngeal erythema.   Lungs:  normal respiratory effort, normal breath sounds, no crackles, and no wheezes.   Heart:  normal rate, regular rhythm, and no gallop.     Impression & Recommendations:  Problem # 1:  URI (ICD-465.9) Pt with probable viral URI.  lungs are clear.  mild pharyngeal erythema.   The following medications were removed from the medication list:    Zyrtec Allergy 10 Mg Caps (Cetirizine hcl) ..... One by mouth once daily  Instructed on symptomatic treatment. Call if symptoms persist or worsen.   Complete Medication List: 1)  Lipitor 40 Mg Tabs (Atorvastatin calcium) .... One by mouth qd 2)  Vitamin D 1000 Unit Tabs (Cholecalciferol)  .... Take 1 tablet by mouth once a day  Current Allergies (reviewed today): ! SULFA ! PCN

## 2010-08-14 NOTE — Assessment & Plan Note (Signed)
Summary: 2 wk f/u/hea   Vital Signs:  Patient profile:   55 year old female Weight:      138 pounds BMI:     23.77 O2 Sat:      99 % on Room air Temp:     97.7 degrees F oral Pulse rate:   64 / minute Pulse rhythm:   regular Resp:     16 per minute BP sitting:   110 / 80  (right arm) Cuff size:   regular  Vitals Entered By: Glendell Docker CMA (July 18, 2009 2:23 PM)  O2 Flow:  Room air  Primary Care Provider:  D. Thomos Lemons DO  CC:  2 Week  Follow up.  History of Present Illness: 2 Week Follow up  55 y/o AA female for f/u re:  dyspnea.   since prev visit - symptoms less noticeable. no chest pain reviewed PFTs - normal CXR - normal  Preventive Screening-Counseling & Management  Alcohol-Tobacco     Smoking Status: quit     Year Quit: 2007  Allergies: 1)  ! Sulfa 2)  ! Pcn  Past History:  Past Medical History: Hyperlipidemia History of tobacco use       Past Surgical History: Hysterectomy      Family History: Mother - ovarian cancer 2008, osteoporosis        Social History: Former Smoker Alcohol use-no   Occupation:  [employer]    Smoking Status:  quit  Physical Exam  General:  alert, well-developed, and well-nourished.   Lungs:  normal respiratory effort and normal breath sounds.   Heart:  normal rate, regular rhythm, and no gallop.   Extremities:  No lower extremity edema    Impression & Recommendations:  Problem # 1:  SHORTNESS OF BREATH (ICD-786.05) Assessment Improved PFTs normal.  CXR normal.   Her symptoms better.  ? deconditioning.   If symptoms return, we discuss repeating stress test.  Complete Medication List: 1)  Lipitor 40 Mg Tabs (Atorvastatin calcium) .... One by mouth qd 2)  Vitamin D 1000 Unit Tabs (Cholecalciferol) .... Take 1 tablet by mouth once a day 3)  Zyrtec Allergy 10 Mg Caps (Cetirizine hcl) .... One by mouth once daily  Patient Instructions: 1)  Please schedule a follow-up appointment in 6  months.  Current Allergies (reviewed today): ! SULFA ! PCN

## 2010-08-14 NOTE — Assessment & Plan Note (Signed)
Summary: CPX/MHF   Vital Signs:  Patient profile:   55 year old female Menstrual status:  hysterectomy Height:      64 inches Weight:      133.25 pounds BMI:     22.95 O2 Sat:      97 % on Room air Temp:     98.1 degrees F oral Pulse rate:   77 / minute Pulse rhythm:   regular Resp:     18 per minute BP sitting:   110 / 60  (right arm) Cuff size:   regular  Vitals Entered By: Glendell Docker CMA (March 01, 2010 8:41 AM)  O2 Flow:  Room air CC: CPX Comments physical for  foster care     Menstrual Status hysterectomy Last PAP Result Hysterectomy   Primary Care Provider:  Dondra Spry DO  CC:  CPX.  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   she is due for LFTs and FLP      Preventive Screening-Counseling & Management  Alcohol-Tobacco     Alcohol drinks/day: 0     Smoking Status: quit  Caffeine-Diet-Exercise     Caffeine use/day: 1-2 beverages daily     Does Patient Exercise: no  Allergies: 1)  ! Sulfa 2)  ! Pcn  Past History:  Past Medical History: Hyperlipidemia History of tobacco use          Past Surgical History: Hysterectomy       Family History: Mother - ovarian cancer 2008, osteoporosis           Social History: Former Smoker Alcohol use-no   Occupation:  [employer]   3 daughters 1 son Divorced  (husband had drug problem)Caffeine use/day:  1-2 beverages daily Does Patient Exercise:  no  Physical Exam  General:  alert, well-developed, and well-nourished.   Head:  normocephalic and atraumatic.   Eyes:  pupils equal, pupils round, and pupils reactive to light.   Ears:  R ear normal and L ear normal.   Mouth:  pharynx pink and moist.   Neck:  supple and full ROM.  no carotid bruits.   Lungs:  normal respiratory  effort, normal breath sounds, no crackles, and no wheezes.   Heart:  normal rate, regular rhythm, no murmur, and no gallop.   Abdomen:  soft, non-tender, normal bowel sounds, no masses, no hepatomegaly, and no splenomegaly.   Extremities:  No lower extremity edema  Neurologic:  cranial nerves II-XII intact and gait normal.   Psych:  normally interactive, good eye contact, not anxious appearing, and not depressed appearing.     Impression & Recommendations:  Problem # 1:  ROUTINE GENERAL MEDICAL EXAM@HEALTH  CARE FACL (ICD-V70.0) Reviewed adult health maintenance protocols.  Mammogram: normal (08/22/2009) Pap smear: Hysterectomy (05/30/2009) Colonoscopy: abnormal (04/01/2007) Td Booster: Tdap (01/07/2009)   Flu Vax: Declined (06/16/2009)   Chol: 193 (10/19/2008)   HDL: 71.20 (10/19/2008)   LDL: 112 (10/19/2008)   TG: 51.0 (10/19/2008) TSH: 0.76 (10/19/2008)     Problem # 2:  HYPERLIPIDEMIA (ICD-272.4) Assessment: Unchanged  Her updated medication list for this problem includes:    Lipitor 40 Mg Tabs (Atorvastatin calcium) ..... One by mouth qd  Orders: T-Hepatic Function [id number]) T-TSH [phone]) T-Lipid Profile [id number]) T-Basic Metabolic Panel [phone])  Complete Medication List: 1)  Lipitor 40  Mg Tabs (Atorvastatin calcium) .... One by mouth qd 2)  Vitamin D 1000 Unit Tabs (Cholecalciferol) .... Take 1 tablet by mouth once a day 3)  Voltaren 1 % Gel (Diclofenac sodium) .... Apply three times a day to affected area  Other Orders: T-CBC No Diff ([id number])  Current Allergies (reviewed today): ! SULFA ! PCN

## 2010-08-14 NOTE — Letter (Signed)
   [place] [facility] [address] [place], [place]  [address]  [place] Phone: [phone]      March 02, 2010   Leslie Boyer [address] [place], [place] [address]  RE:  LAB RESULTS  Dear  Ms. FRETZ,  The following is an interpretation of your most recent lab tests.  Please take note of any instructions provided or changes to medications that have resulted from your lab work.  ELECTROLYTES:  Good - no changes needed  KIDNEY FUNCTION TESTS:  Good - no changes needed  LIVER FUNCTION TESTS:  Good - no changes needed  LIPID PANEL:  Fair - review at your next visit Triglyceride: 54   Cholesterol: 205   LDL: 128   HDL: 66   Chol/HDL%:  3.1 Ratio  THYROID STUDIES:  Thyroid studies normal TSH: 1.084     CBC:  Good - no changes needed      Sincerely Yours,    Dr. Thomos Lemons

## 2010-08-14 NOTE — Progress Notes (Signed)
Summary: pelvic u/s result  Phone Note Outgoing Call   Summary of Call: call pt - pelvic u/s shows stable left ovarian simple cyst.  no other changes Initial call taken by: D. Thomos Lemons DO,  December 15, 2009 1:14 PM  Follow-up for Phone Call        Pt notified of u/s result.  Mervin Kung CMA  December 15, 2009 2:15 PM

## 2010-08-14 NOTE — Letter (Signed)
Summary: Medical Eval Form/[facility]  Medical Eval Form/[place] [facility]   Imported By: Lanelle Bal 04/02/2010 13:58:23  _____________________________________________________________________  External Attachment:    Type:   Image     Comment:   External Document

## 2010-08-14 NOTE — Medication Information (Signed)
Summary: Care Consideration Regarding Lipid Lowering Agent/[employer]  Care Consideration Regarding Lipid Lowering Agent/[employer]   Imported By: Lanelle Bal 03/09/2010 11:00:05  _____________________________________________________________________  External Attachment:    Type:   Image     Comment:   External Document

## 2010-10-04 ENCOUNTER — Telehealth: Payer: Self-pay | Admitting: Internal Medicine

## 2010-10-04 NOTE — Telephone Encounter (Signed)
Atorvastatin 40mg  tab qty 30 take 1 by mouth every day written on 10.12.11

## 2010-10-05 MED ORDER — ATORVASTATIN CALCIUM 40 MG PO TABS: 40.0000 mg | ORAL_TABLET | Freq: Every day | ORAL | Status: DC

## 2010-10-05 NOTE — Telephone Encounter (Signed)
Addended by: Mervin Kung on: 10/05/2010 05:02 PM   Modules accepted: Orders

## 2010-10-05 NOTE — Telephone Encounter (Signed)
Pharmacy not selected originally. [facility]

## 2010-10-05 NOTE — Telephone Encounter (Signed)
Refill sent to pharmacy.   

## 2010-11-12 ENCOUNTER — Telehealth: Payer: Self-pay | Admitting: Internal Medicine

## 2010-11-12 DIAGNOSIS — E785 Hyperlipidemia, unspecified: Secondary | ICD-10-CM

## 2010-11-12 NOTE — Telephone Encounter (Signed)
Refill-atorvastatin 40mg  tablet. Take 1 tablet by mouth every day. Qty 30. Last fill 3.27.12

## 2010-11-13 MED ORDER — ATORVASTATIN CALCIUM 40 MG PO TABS: 40.0000 mg | ORAL_TABLET | Freq: Every day | ORAL | Status: DC

## 2010-11-13 NOTE — Telephone Encounter (Signed)
Rx refill sent to pharmacy per Dr Artist Pais okay

## 2010-11-14 ENCOUNTER — Ambulatory Visit (INDEPENDENT_AMBULATORY_CARE_PROVIDER_SITE_OTHER): Payer: Managed Care, Other (non HMO) | Admitting: Internal Medicine

## 2010-11-14 ENCOUNTER — Encounter: Payer: Self-pay | Admitting: Internal Medicine

## 2010-11-14 VITALS — Ht 64.0 in | Wt 132.0 lb

## 2010-11-14 DIAGNOSIS — E785 Hyperlipidemia, unspecified: Secondary | ICD-10-CM

## 2010-12-29 IMAGING — CR DG CHEST 2V
2 series · 2 of 2 positions shown · non-contrast
Comparison: None available.

CLINICAL DATA: Shortness of breath.

CHEST - 2 VIEW

[w chest pa]
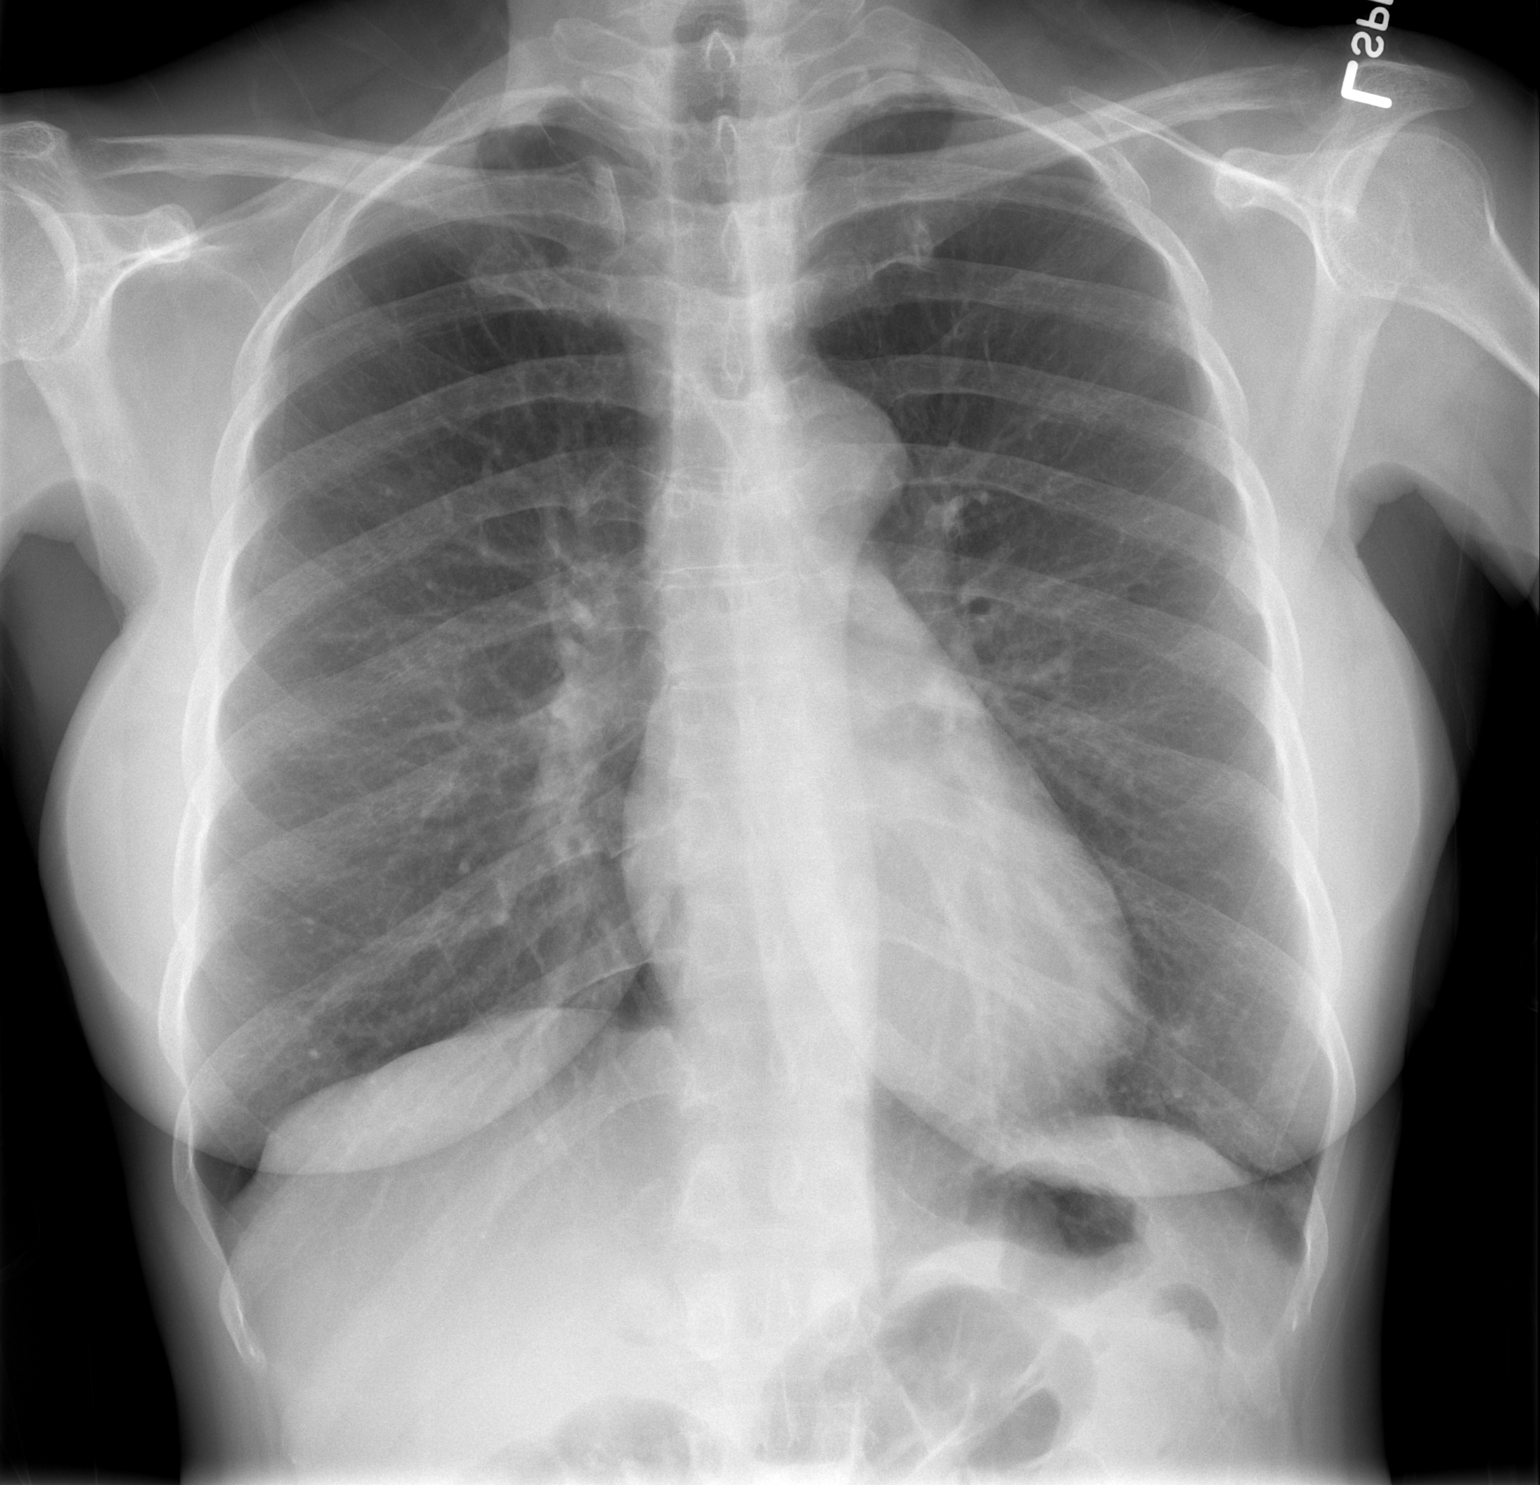

[w chest lat]
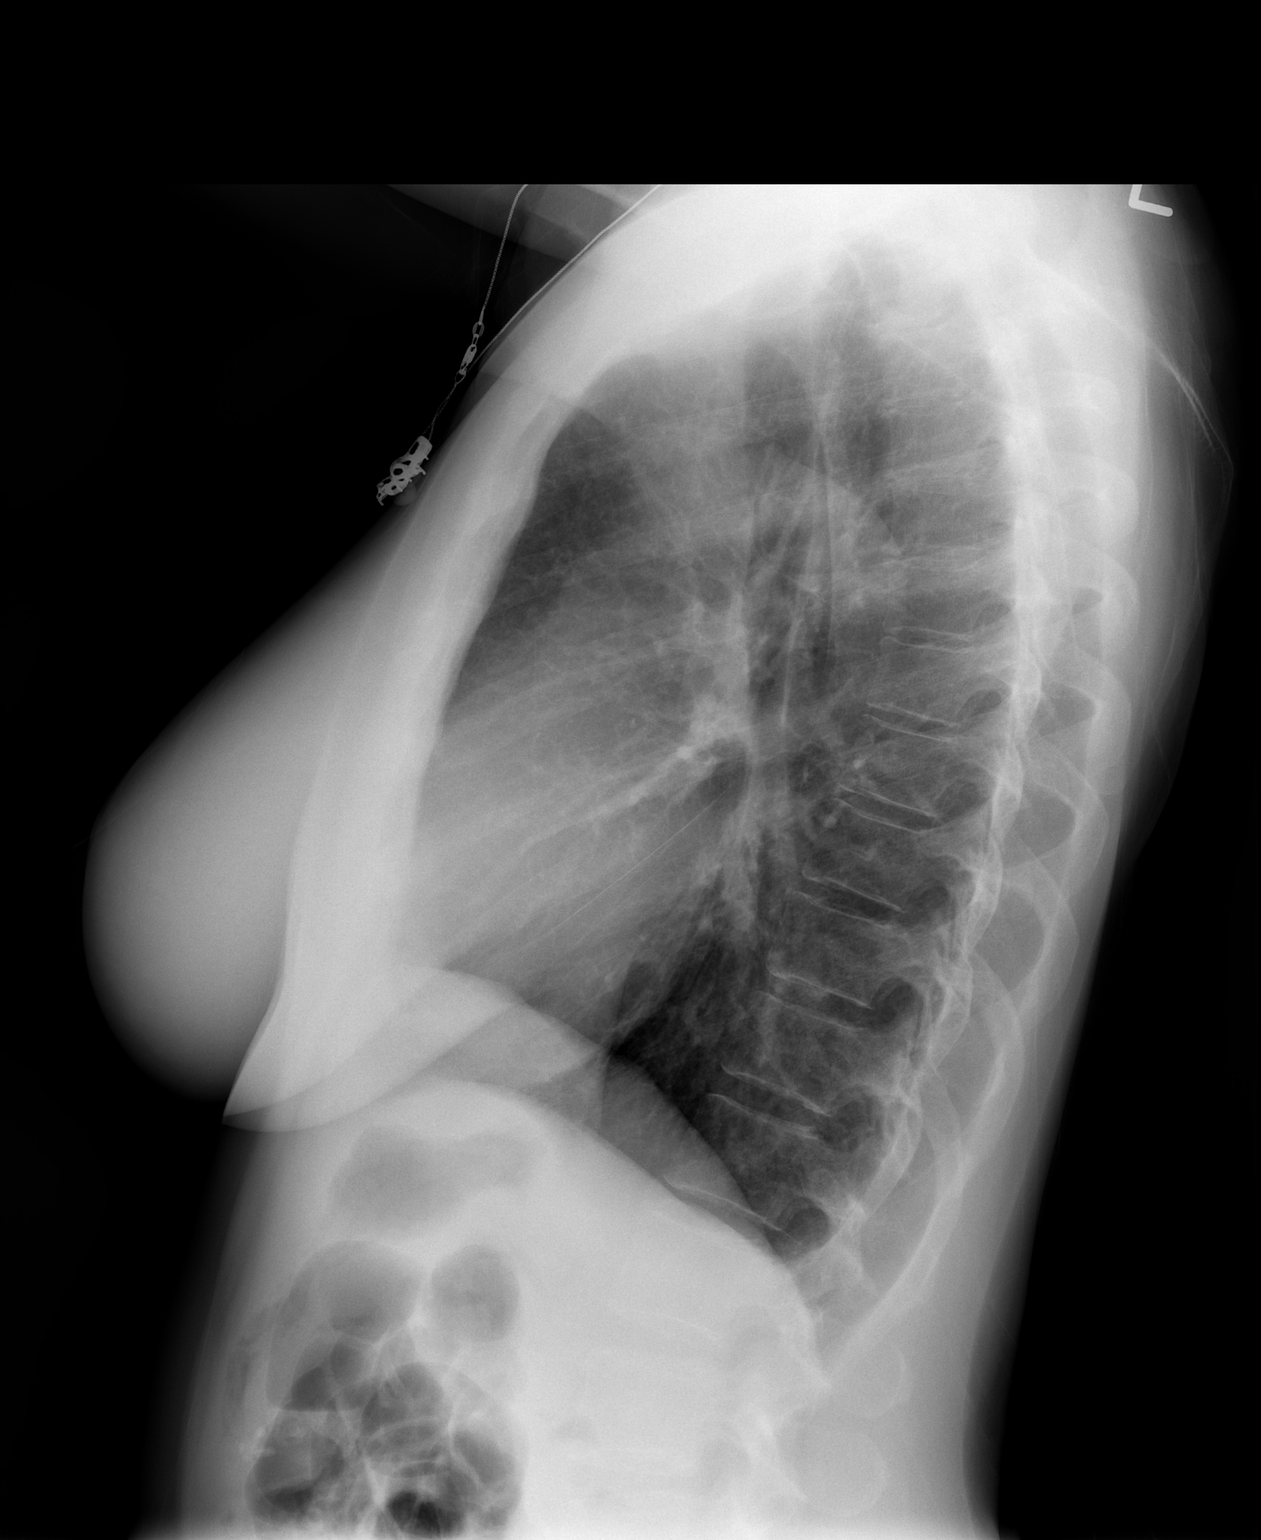

[2 of 2 positions shown; findings below may reference images not displayed]

FINDINGS: Lungs are clear.  Heart size is normal.  No pleural
effusion or focal bony abnormality.
IMPRESSION: No acute disease.

## 2011-01-30 NOTE — Progress Notes (Signed)
  Subjective:    Patient ID: Leslie Boyer, female    DOB: 1955-10-06, 55 y.o.   MRN: [id number]  HPI  Visit was cancelled  Review of Systems     Objective:   Physical Exam        Assessment & Plan:

## 2011-02-12 ENCOUNTER — Ambulatory Visit (INDEPENDENT_AMBULATORY_CARE_PROVIDER_SITE_OTHER): Payer: Managed Care, Other (non HMO) | Admitting: Internal Medicine

## 2011-02-12 ENCOUNTER — Encounter: Payer: Self-pay | Admitting: Internal Medicine

## 2011-02-12 VITALS — BP 120/60 | HR 64 | Temp 97.8°F | Resp 16 | Ht 64.0 in | Wt 133.0 lb

## 2011-02-12 DIAGNOSIS — M549 Dorsalgia, unspecified: Secondary | ICD-10-CM

## 2011-02-12 DIAGNOSIS — M545 Low back pain, unspecified: Secondary | ICD-10-CM

## 2011-02-12 LAB — POCT URINALYSIS DIPSTICK
Bilirubin, UA: NEGATIVE
Blood, UA: NEGATIVE
Glucose, UA: NEGATIVE
Ketones, UA: NEGATIVE
Nitrite, UA: NEGATIVE
Protein, UA: NEGATIVE
Spec Grav, UA: 1.015
Urobilinogen, UA: NEGATIVE
pH, UA: 6

## 2011-02-12 MED ORDER — CYCLOBENZAPRINE HCL 5 MG PO TABS: 5.0000 mg | ORAL_TABLET | Freq: Three times a day (TID) | ORAL | Status: DC | PRN

## 2011-02-12 MED ORDER — DICLOFENAC SODIUM 75 MG PO TBEC: DELAYED_RELEASE_TABLET | ORAL | Status: AC

## 2011-02-12 NOTE — Progress Notes (Signed)
  Subjective:    Patient ID: Leslie Boyer, female    DOB: 02-Sep-1955, 55 y.o.   MRN: [id number]  HPI Pt presents to clinic for evaluation of back pain. Notes two week h/o right low back pain without radiation, numbness or leg weakness. No injury/trauma or fall but may have lifted something prior to onset. No urinary sx's such as urinary frequency or dysuria. Attempted otc advil without improvement. Pain worse when lying down. No other exacerbating or alleviating factors. No other complaints.  Reviewed pmh, medications and allergies    Review of Systems see hpi     Objective:   Physical Exam  Nursing note and vitals reviewed. Constitutional: She appears well-developed and well-nourished. No distress.  HENT:  Head: Normocephalic and atraumatic.  Right Ear: External ear normal.  Left Ear: External ear normal.  Eyes: Conjunctivae are normal. No scleral icterus.  Musculoskeletal:       No midline ls tenderness. +right paraspinal muscle spasm with mild tenderness. Gait nl  Neurological: She is alert.  Skin: Skin is warm and dry. She is not diaphoretic.  Psychiatric: She has a normal mood and affect.          Assessment & Plan:   No problem-specific assessment & plan notes found for this encounter.

## 2011-02-12 NOTE — Assessment & Plan Note (Signed)
UA without blood. Appears to be msk etiology. Begin voltaren prn with food and no other nsaids. Also flexeril prn and cautioned regarding possible sedating effect. Followup if no improvement or worsening.

## 2011-02-15 ENCOUNTER — Ambulatory Visit: Admit: 2011-02-15 | Discharge: 2011-02-15 | Payer: Self-pay | Source: Ambulatory Visit

## 2011-02-20 ENCOUNTER — Ambulatory Visit: Payer: Managed Care, Other (non HMO) | Admitting: Internal Medicine

## 2011-02-25 ENCOUNTER — Ambulatory Visit: Payer: Managed Care, Other (non HMO) | Admitting: Internal Medicine

## 2011-02-26 ENCOUNTER — Ambulatory Visit: Admit: 2011-02-26 | Discharge: 2011-02-26 | Payer: Self-pay | Source: Ambulatory Visit

## 2011-02-27 ENCOUNTER — Ambulatory Visit: Admit: 2011-02-27 | Discharge: 2011-02-27 | Payer: Self-pay | Source: Ambulatory Visit

## 2011-03-11 ENCOUNTER — Encounter: Payer: Managed Care, Other (non HMO) | Admitting: Internal Medicine

## 2011-03-11 DIAGNOSIS — Z029 Encounter for administrative examinations, unspecified: Secondary | ICD-10-CM

## 2011-03-19 ENCOUNTER — Telehealth: Payer: Self-pay | Admitting: Internal Medicine

## 2011-03-19 DIAGNOSIS — E785 Hyperlipidemia, unspecified: Secondary | ICD-10-CM

## 2011-03-19 MED ORDER — ATORVASTATIN CALCIUM 40 MG PO TABS: 40.0000 mg | ORAL_TABLET | Freq: Every day | ORAL | Status: DC

## 2011-03-19 NOTE — Telephone Encounter (Signed)
Refill- atorvastatin 40mg  tablet. Take one tablet by mouth every day. Qty 30 last fill 7.31.12

## 2011-03-19 NOTE — Telephone Encounter (Signed)
Rx refill sent to pharmacy. 

## 2011-04-07 LAB — ECG 12-LEAD
Atrial Rate: 72 {beats}/min
Atrial Rate: 84 {beats}/min
P Axis: 43 degrees
P Axis: 40 degrees
P-R Interval: 154 ms
P-R Interval: 150 ms
Q-T Interval: 406 ms
Q-T Interval: 376 ms
QRS Duration: 80 ms
QRS Duration: 84 ms
QTC Calculation (Bezet): 444 ms
QTC Calculation (Bezet): 444 ms
R Axis: -5 degrees
R Axis: 6 degrees
T Axis: 114 degrees
T Axis: 124 degrees
Ventricular Rate: 72 {beats}/min
Ventricular Rate: 84 {beats}/min

## 2011-04-07 LAB — ECG-RIGHT SIDED
Atrial Rate: 84 {beats}/min
P Axis: 23 degrees
P-R Interval: 134 ms
Q-T Interval: 376 ms
QRS Duration: 78 ms
QTC Calculation (Bezet): 444 ms
R Axis: 3 degrees
T Axis: 122 degrees
Ventricular Rate: 84 {beats}/min

## 2011-04-15 ENCOUNTER — Ambulatory Visit (INDEPENDENT_AMBULATORY_CARE_PROVIDER_SITE_OTHER): Payer: Managed Care, Other (non HMO) | Admitting: Internal Medicine

## 2011-04-15 ENCOUNTER — Encounter: Payer: Self-pay | Admitting: Internal Medicine

## 2011-04-15 DIAGNOSIS — E785 Hyperlipidemia, unspecified: Secondary | ICD-10-CM

## 2011-04-15 DIAGNOSIS — J069 Acute upper respiratory infection, unspecified: Secondary | ICD-10-CM | POA: Insufficient documentation

## 2011-04-15 DIAGNOSIS — R209 Unspecified disturbances of skin sensation: Secondary | ICD-10-CM

## 2011-04-15 DIAGNOSIS — R202 Paresthesia of skin: Secondary | ICD-10-CM

## 2011-04-15 MED ORDER — BENZONATATE 100 MG PO CAPS: 100.0000 mg | ORAL_CAPSULE | Freq: Three times a day (TID) | ORAL | Status: AC | PRN

## 2011-04-15 NOTE — Assessment & Plan Note (Signed)
Reviewed symptomatic treatment.  Use tessalon tid prn.  Patient advised to call office if symptoms persist or worsen.

## 2011-04-15 NOTE — Patient Instructions (Signed)
Please call our office if your symptoms do not improve or gets worse.  

## 2011-04-15 NOTE — Assessment & Plan Note (Signed)
Pt having intermittent tingling in forearms.  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   Pt advised to avoid overuse.  If persistent symptoms, we discussed NCS. Check B12 and TSH.  Unclear why she is experiencing chest tingling while showering.  Consider polycythemia.  Check CBC.  If persistent symptoms, consider imaging of thoracic spine.

## 2011-04-15 NOTE — Progress Notes (Signed)
Subjective:    Patient ID: Leslie Boyer, female    DOB: January 24, 1956, 55 y.o.   MRN: [id number]  Cough This is a new problem. The current episode started in the past 7 days. The problem has been unchanged. The problem occurs hourly. The cough is non-productive. Associated symptoms include nasal congestion. Pertinent negatives include no chest pain, chills, fever or shortness of breath. She has tried nothing for the symptoms.   Pt also complains of intermittent tingling in her forearms.  She associates with playing video game too often.  No upper ext weakness.   No headache.  No hand weakness.  Pt also complains her chest feels "tingly" when she showers in the AM.  No rash.  No chest wall tenderness.   Review of Systems  Constitutional: Negative for fever and chills.  Respiratory: Positive for cough. Negative for shortness of breath.   Cardiovascular: Negative for chest pain.   Past Medical History  Diagnosis Date  . Hyperlipidemia   . History of tobacco abuse     History   Social History  . Marital Status: Divorced    Spouse Name: N/A    Number of Children: N/A  . Years of Education: N/A   Occupational History  . Not on file.   Social History Main Topics  . Smoking status: Former Games developer  . Smokeless tobacco: Not on file  . Alcohol Use: Not on file  . Drug Use: Not on file  . Sexually Active: Not on file   Other Topics Concern  . Not on file   Social History Narrative   Former [employer] use-no  Occupation:  [employer]  3 daughters1 sonDivorced  (husband had drug problem)     Past Surgical History  Procedure Date  . Total abdominal hysterectomy     Family History  Problem Relation Age of Onset  . Ovarian cancer Mother     2008  . Osteoporosis Mother     Allergies  Allergen Reactions  . Penicillins   . Sulfonamide Derivatives     REACTION: Hives    Current Outpatient Prescriptions on File Prior to Visit  Medication Sig Dispense Refill  .  atorvastatin (LIPITOR) 40 MG tablet Take 1 tablet (40 mg total) by mouth daily.  30 tablet  3    BP 134/96  Temp(Src) 98.2 F (36.8 C) (Oral)  Ht 5\' 4"  (1.626 m)  Wt 133 lb (60.328 kg)  BMI 22.83 kg/m2       Objective:   Physical Exam   Constitutional: Appears well-developed and well-nourished. No distress.  Head: Normocephalic and atraumatic.  Right Ear: External ear normal.  Left Ear: External ear normal.  Mouth/Throat: Oropharynx is clear and moist.  Eyes: Conjunctivae are normal. Pupils are equal, round, and reactive to light.  Neck: Normal range of motion. Neck supple. No thyromegaly present. No carotid bruit Cardiovascular: Normal rate, regular rhythm and normal heart sounds.  Exam reveals no gallop and no friction rub.   No murmur heard. Pulmonary/Chest: Effort normal and breath sounds normal.  No wheezes. No rales.  Abdominal: Soft. Bowel sounds are normal. No mass. There is no tenderness.  Neurological: Alert. No cranial nerve deficit. Upper ext strength is 5/5.  No deficit in sensation.  After to sense temp and vibration bilaterally.  Upper and lower ext reflexes are equal and symmetric Skin: Skin is warm and dry.  Psychiatric: Normal mood and affect. Behavior is normal.      Assessment & Plan:

## 2011-04-16 LAB — HEPATIC FUNCTION PANEL
ALT: 16 U/L (ref 0–35)
Albumin: 4.2 g/dL (ref 3.5–5.2)
Alkaline Phosphatase: 56 U/L (ref 39–117)
Bilirubin, Direct: 0 mg/dL (ref 0.0–0.3)
Total Protein: 7.7 g/dL (ref 6.0–8.3)

## 2011-04-16 LAB — BASIC METABOLIC PANEL
CO2: 30 mEq/L (ref 19–32)
Calcium: 9.1 mg/dL (ref 8.4–10.5)
Chloride: 104 mEq/L (ref 96–112)
Creatinine, Ser: 0.9 mg/dL (ref 0.4–1.2)
Glucose, Bld: 94 mg/dL (ref 70–99)

## 2011-04-16 LAB — TSH: TSH: 0.36 u[IU]/mL (ref 0.35–5.50)

## 2011-04-16 LAB — LIPID PANEL
HDL: 64.4 mg/dL (ref >39.00)
Total CHOL/HDL Ratio: 3
Triglycerides: 80 mg/dL (ref 0.0–149.0)

## 2011-04-16 LAB — CBC
HCT: 37.4 % (ref 36.0–46.0)
MCHC: 32.6 g/dL (ref 30.0–36.0)
MCV: 93.3 fL (ref 78.0–100.0)
Platelets: 402 10*3/uL — ABNORMAL HIGH (ref 150–400)
RDW: 13.5 % (ref 11.5–15.5)
WBC: 5.2 10*3/uL (ref 4.0–10.5)

## 2011-04-17 ENCOUNTER — Encounter: Payer: Self-pay | Admitting: Internal Medicine

## 2011-05-03 NOTE — H&P (Signed)
Account Number: [id number]      Document ID: [id number]      Admit Date: 10/31/2008            Patient Location: [facility]      Patient Type: V            ATTENDING PHYSICIAN: Windell Moment, MD                  PRIMARY CARE PHYSICIAN:      [facility].            ADMITTING DIAGNOSES:      Chest pain.            HISTORY OF PRESENT ILLNESS:      The patient is a 55 year old obese, African American lady with history of      hypertension, history of heart murmur, who recently had a stress test by      her outpatient cardiologist, Dr. Sonny Masters, per the cardiologist here      was noted to be abnormal with some moderate lateral reversible defect,      basically came in with this chest pain since Friday.  As per the patient,      patient was apparently well and started experiencing this pain, which      started in her abdomen, it was on both sides of the abdomen and quickly      radiated to the chest up to the neck and to the jaw, associated shortness      of breath.  The pain lasted for about 15 minutes and then came back again.      It this present intermittently, and progressively getting stronger and      stronger, initially the pain was 10/10, but today it seemed to be more than      10/10, associated with some dry heaves, but no nausea and vomiting.  She      states she had some palpitations, but no dizziness, no productive cough.      The pain is worse every time she sits up and it is better when she lies      down, but otherwise there is no aggravating or relieving factors with      exertion, or no relation to food.  She has never had similar episode in the      past.            In the emergency room, she had CT angiogram of the abdomen and pelvis given      this particular characteristic of the pain.  CT angio of the abdomen said      top normal heart size was normal, LVH, mild dilatation of the proximal      thoracic aorta, it was otherwise normal and unremarkable with no aneurysm      or  dissection; there is a small pericardial effusion.  There were no      pleural effusions, no pneumothorax.  The rest of the organs were normal.      The central pulmonary arteries were top normal caliber without any filling      defects.            She had an EKG that showed normal sinus rhythm with some left ventricular      hypertrophy and repolarization changes.  First set of cardiac enzymes were      negative.  She was evaluated by cardiology, Dr. Jari Pigg, who  obtained records from her outpatient cardiologist, including the stress      test, which was found to be abnormal.  The patient is being admitted for      abnormal outpatient stress test and she will be transferred to [facility] for cardiac catheterization, per the cardiology      recommendation at this point.            PAST MEDICAL HISTORY:      1.  Hypertension.      2.  Heart murmur and she takes antibiotic prophylaxis for dental      procedures.            ALLERGIES:      ASPIRIN ANAPHYLAXIS AND OVER THE COUNT COUNTER COLD MEDICATIONS.            PAST SURGICAL HISTORY:      Tubal ligation, hernia repair.            HOME MEDICATIONS:      She takes Diovan 320 mg once a day, labetalol 200 mg 3 times a day.  She      takes HCTZ, which is the combination and she is not aware of the name of      the other medication.            SOCIAL HISTORY:      She is married, lives with her husband.  She has 2 grown up daughters.  She      works for the [employer].  No smoking.  Social alcohol.            FAMILY HISTORY:      Her mother died of melanoma.  She also had breast cancer.  Father died of      gunshot injuries.  One of her sisters died of heart attack at 53, and her      brother died of heart attack at 45.  She has a strong history of heart      disease in the family.            REVIEW OF SYSTEMS:      CONSTITUTIONAL:  No fevers or chills, no headache, no neck pain.  No visual      problems.      HEART:  Chest  pain and shortness of breath as discussed above.      LUNGS:  No cough, no wheezing.      ABDOMEN:  See H and P.      NEUROLOGIC:  Negative.      SKIN AND INTEGUMENT:  Negative.      MUSCULOSKELETAL:  Negative.      ENDOCRINE:  Negative.            PHYSICAL EXAMINATION:      GENERAL:  A 55 year old lying comfortably in bed, not in any acute      distress.      VITAL SIGNS:  In the emergency room, she was afebrile, pulse was 91, blood      pressure was 248/111, her pulse oximetry 97% on room air.  She received      clonidine 0.1 mg once, Lopressor 5 mg IV once, 1-inch nitro paste, and she      was given nitroglycerin sublingual tablets x3.            LABORATORY DATA:      Hematology panel showed a hemoglobin of 9.9, hematocrit 31.9.  BMP was  normal except for a random blood glucose of 112.  First set of cardiac      enzymes were negative.  D-dimer was 748.            DIAGNOSTIC STUDIES:      Chest x-ray was normal.  The CAT scan of the abdomen and chest, the results      of which are as discussed in H and P.            ASSESSMENT AND PLAN:      A 55 year old obese African American lady with history of hypertension, and      an abnormal outpatient stress test, comes in with this abdominal pain,      chest pain and is found to have hypertensive urgency as well.            PLAN:      The patient will be admitted to the telemetry.  We will continue to monitor      her serial cardiac enzymes.  She has already been evaluated by cardiology      and per recommendation was started on labetalol 400 mg twice a day, Diovan      was continued at 320 mg once a day, Plavix 300 mg once a day x1, and 75 mg      once a day.  She is also on nitro paste 1 inch q.6 hours.  She is also for      some p.r.n. hydralazine by the cardiologist and she also will get 1 dose of      Lovenox per the cardiology recommendation.  She will be kept n.p.o. after      midnight and possible transfer to [facility] for cardiac       catheterization for the abnormal stress test.            Hypertensive emergency:  We will continue her Diovan as before and      labetalol also has been increased to 400 twice a day by the cardiologist.      We will continue HCTZ 25 mg once a day, and she will also be given some      p.r.n. hydralazine.            We will also check LFTs and lipase.  Her prophylaxis will be Lovenox and      Protonix once a day.  Further management will depend upon the hospital      course and transfer to [facility].            Thanks for allowing [place] to participate in the care of the patient.                        Electronic Signing Provider      _______________________________     Date/Time Signed: _____________      Windell Moment MD ([id number])            D:  10/31/2008 15:29 PM by Dr. Windell Moment, MD ([id number])      T:  10/31/2008 16:29 PM by [id number]          Everlean Cherry: [id number]) (Doc ID: [id number])                  [id number] Marinell Blight MD      Windell Moment MD

## 2011-05-03 NOTE — Consults (Signed)
Account Number: [id number]      Document ID: [id number]      Admit Date: 10/31/2008      Service Date: 10/31/2008            Patient Location: [facility]      Patient Type: V            CONSULTING PHYSICIAN: Jari Pigg MD            REFERRING PHYSICIAN: Debby Bud MD                  CONSULTING SERVICE:      Cardiology.            CHIEF COMPLAINT:      Chest pain.            HISTORY OF PRESENT ILLNESS:      The patient is a 55 year old female with cardiovascular risk factors of      hypertension, age, who presents for evaluation of chest discomfort and high      blood pressure.  The patient says that yesterday morning, she awoke with      the sensation of bilateral lower quadrant discomfort, which seemed to      radiate up into her stomach and then up into her chest.  It was rather      excruciating discomfort, that last about 15 to 20 minutes and then resolved      on its own.  It then recurred during the day and intermittently thereafter.       This morning it again persisted when she is sitting at the desk.  She came      in for further evaluation to the ER.  She notes the chest discomfort is      more of a tightness sensation, seems to go into her jaw at times as well.      No diaphoresis, nausea, vomiting or syncope.            Note that she has had high blood pressure for quite some time.  There is no      record in our system in 2004 of a renal MRA that was negative for renal      artery stenosis.            Her initial troponin is negative.  Her EKG shows LVH with secondary strain      pattern in the high lateral leads with T-wave inversion, otherwise no acute      ischemic changes.            The patient says she has been compliant with her medications.            PAST MEDICAL HISTORY:      1.  Hypertension      2.  Hernia repair.      3.  Tubal ligation.      4.  The patient is nonsmoker, occasional alcohol use.            MEDICATIONS:      Labetalol 200 mg 3 times a day, Diovan 320 mg once a day,       hydrochlorothiazide, patient unclear of dose, but thinks it is 75 over      something.            ALLERGIES:      ASPIRIN, ANTIHISTAMINES AND COLD MEDICINES, gets hives.            REVIEW OF SYSTEMS:  Constitutional, head, eyes, ears, nose, mouth, throat, GI, GU,      musculoskeletal, skin, neurologic, and respiratory all negative unless      noted in HPI.            FAMILY HISTORY:      Negative for premature coronary artery.            PHYSICAL EXAMINATION:      VITAL SIGNS:  Temperature 97.5, blood pressure is 210/102, heart rate is 77      and regular, respiratory rate 16.      GENERAL:  Patient well-developed, well-nourished, is moderately obese, is      in no acute distress.  She is breathing comfortably.      HEENT:  Normocephalic, no elevated jugular venous distention, no carotid      bruit.  She has a brisk carotid upstroke.      CARDIAC:  Normal S1 and S2.  There is no murmur, no S3, S4.  The PMI is not      displaced.  There is no rub.      LUNGS:  Clear to auscultation without wheeze.      ABDOMEN:  Protuberant but soft, mildly tender in right lower quadrant.  No      rebound.      EXTREMITIES:  There is no peripheral edema.      VASCULAR:  Equal radial pulses, 4/4.      NEUROLOGIC:  She is alert and oriented x3, mood and affect is normal.            LABORATORY DATA:      White blood cell count 4.1, hemoglobin 9.9, platelet count is 184.  Sodium      142, potassium 4.3, creatinine 0.9.  Troponin less than 0.01 with a      negative CK, D-dimer is 748.            DIAGNOSTIC DATA:      Chest x-ray reveals no infiltrates or effusion, normal heart size.      EKG, I reviewed, reveals a sinus rhythm, T-wave inversions high lateral      leads.  LVH by voltage criteria.            IMPRESSION:      1.  Patient admitted with hypertensive urgency and atypical chest      discomfort.  Initial troponin negative.      2.  Hypertension with urgency.      3.  Aspirin allergy.  MRA of the renals negative for renal  artery stenosis      in 2004      4.  Anemia.      5.  Obesity.      6.  Possible sleep apnea.            RECOMMENDATIONS:      1.  Aggressive blood pressure control.  We will add clonidine and continue      with beta blockade and ACE inhibition.      2.  Chest CT in abdomen and pelvis to rule out dissection given the      hypertension and her symptoms of abdominal discomfort radiating up into her      chest.  This was quite excruciating for her.      3.  Serial cardiac enzymes.      4.  Obtain records of her recent thallium and echocardiogram in Dr. Milta Deiters      office.  5.  Obstructive sleep apnea screen as an outpatient would be reasonable as      well.      6.  Weight reduction.                        Electronic Signing Provider      _______________________________     Date/Time Signed: _____________      Jari Pigg MD ([id number])            D:  10/31/2008 11:56 AM by Dr. Jari Pigg, MD ([id number])      T:  10/31/2008 17:05 PM by [id number]          Everlean Cherry: [id number]) (Doc ID: [id number])                  cc:

## 2011-05-03 NOTE — Discharge Summary (Signed)
Account Number: [id number]      Document ID: [id number]      Admit Date: 10/31/2008      Discharge Date: 11/02/2008            ATTENDING PHYSICIAN:  Windell Moment, MD                  CONSULTATION:      Cardiac, Dr. Marinell Blight.            CARDIOLOGIST:      Dr. Sonny Masters.            The patient is transferred to [facility] for cardiac      catheterization and she will be taken care by Dr. Sonny Masters,      cardiologist at [facility].            ADMITTING DIAGNOSIS:      Chest pain.  Hypertensive urgency            DISCHARGE DIAGNOSIS:      1.  Chest pain with abnormal stress test for cardiac catheterization      2. Hypertensive urgency.            HISTORY OF PRESENT ILLNESS:      The patient is a 55 year old obese African American lady with history of      hypertension, history of a heart murmur, who had a stress test outpatient      by her cardiologist, Dr. Sonny Masters and noted to be abnormal with some      moderate lateral reversible defect; came in with the chest pain.  Since her      chest pain started in her abdomen and was radiating all the way up to her      chest, she had a CT angiogram of the chest and abdomen that was negative      for pulmonary embolism and dissection and aneurysm respectively.  She was      evaluated by cardiology and was admitted to the telemetry with orders for      acute coronary syndrome.  She was given a loading dose of Plavix along with      75 mg once a day.  She was also started on Lovenox and nitroglycerine      paste.  Since her blood pressure was noted to be very high, she was also      started on labetalol. Please refer to the detailed history and physical      dictated at the time of admission.            HOSPITAL COURSE:      1.  The patient was admitted to the telemetry.  Her telemetry readings were      unrevealing.  Her cardiac enzymes were negative.  Since she had an abnormal      stress test, she was transferred to [facility] for cardiac      catheterization.      2.  Hypertensive urgency:  Labetalol was increased to 400 mg three times a      day.  She was on the nitroglycerine paste.  Her blood pressure still      remained high normal and she is currently transferred to [facility] and her blood pressure will be managed at [facility].      3.  Other medical problems remain stable.  TRANSFER CONDITION:      Stable.  Further management will depend upon the cardiac catheterization at      [facility].            Thanks for allowing [place] to participate in the care of the patient.            R:msp 11/09/2008 1:41 p.m.                        Electronic Signing Provider      _______________________________     Date/Time Signed: _____________      Windell Moment MD ([id number])            D:  11/02/2008 15:14 PM by Dr. Windell Moment, MD ([id number])      T:  11/03/2008 09:38 AM by [id number]          Everlean Cherry: [id number]) (Doc ID: [id number])                        SA:[id number] Welton Flakes MD      Jari Pigg MD      Windell Moment MD

## 2011-05-03 NOTE — Op Note (Signed)
Account Number: [id number]      Document ID: [id number]      Admit Date: 11/02/2008      Procedure Date: 11/02/2008      Order #:            Patient Location: DISCHARGED 11/03/2008      Patient Type: I            ATTENDING PHYSICIAN: Sonny Masters, MD            PROCEDURE PERFORMED BY: Cappella Broom MD                  INDICATIONS AND HISTORY:      A 55 year old female who presented with chest pain.  Cardiac risk factors      include hypertension, a systolic pressure greater than 190 on presentation,      hypercholesterolemia, obesity.  She underwent an outpatient nuclear stress      test and showed presence of a moderate-sized area of moderately severity      ischemia in the lateral wall an circumflex territory.  The patient presented      to [facility] with symptoms of chest pain.  Troponins were      negative.  She was then transferred to [facility] for cardiac      catheterization.  Informed consent obtained from the patient and the      family, informing of the risk of myocardial infarction, death, vascular      complications, groin complications, risk of radiocontrast nephropathy, and      risk was procedure and  brought to the catheterization lab in stable      condition            PRECATHETERIZATION DIAGNOSIS:      Abnormal nuclear stress test, chest pain.            POSTCATHETERIZATION DIAGNOSIS:      1.  Nonobstructive coronary artery disease noted in the first large      diagonal and mid LAD.      2.  Normal left ventricular systolic function.      3.  Normal aortogram with patent renal arteries.            PROCEDURE:      Left heart cardiac catheterization, selective coronary angiography,      LV-gram, and aortogram to visualize the renals given the malignant      hypertension.            TECHNIQUE:      Using the modified Seldinger technique, a 6-French sheath was passed      without difficulty into the right femoral artery. JL-4 was used for      the right coronary artery, pigtail  catheter with LV-gram and aortogram.            FINDINGS:      By fluoroscopy, the patient's systolic pressure was greater than 200 prior      to the procedure and she was also nauseous; therefore, she was given Zofran      and she was started on IV nitroglycerin.  In the catheter lab, she was      given 5 of Lopressor also.  By fluoroscopy there is no significant      calcification noted.  Left main is a short large size vessel and appears to      be free of disease, bifurcates into LAD and circumflex.  LAD is  a      large-sized vessel and proximally it gives off a large diagonal branch.      The first diagonal ostium has a 40% to 50% lesion, no more than that      angiographically.  Then the LAD beyond the origin of the diagonal has about      a 30% to 40% lesion at most angiographically and LAD wraps around the apex.       The circumflex is a large-size vessel and it gives off a medium-sized OM      branch.  The second OM system appears to be free of any obstructive      disease.  RCA is a large-size vessel, is a codominant vessel, and it      bifurcates into the PDA and PLB and there is no significant lesion here      noted either.  The LV-gram is done and systolic function is normal.  LVEDP      is about 15 mmHg.  The washout is seen.  The ascending aorta is visualized      and appears to be normal in caliber.  Abdominal aortogram is done to      visualize the renal arteries, the presence of a single renal artery      supplying each kidney.  The left renal artery makes a loop before entering      into the left renal artery.  Right iliac ANGIO is done to visualize      placement of the sheath above the bifurcation common iliac artery and was      considered suitable for placement of an Angio-Seal device.            IMPRESSION:      1.  There is no significant obstructive coronary artery disease noted.  As      described non-obstructive coronary artery disease is noted.  Ostium of the      diagonal about a 40% to  50% lesion noted most and mid LAD has 30% to 40%      lesion.      2.  Normal left ventricular systolic function.      3.  Patent renal arteries and normal abdominal aorta.            RECOMMENDATIONS:      Aggressive risk modification is suggested.  Systolic pressure of 180 was      noted at 80 mcg of IV nitro.  The patient already has been on beta blockers      and ACE receptor inhibitors.  She will continue on that and the plan would      be to achieve adequate systolic blood pressure control and then to      discharge the patient.            I would like to thank ____ for allowing me to participate in this care of      this pleasant patient.                        Electronic Signing Provider      _______________________________     Date/Time Signed: _____________      Vink Broom MD ([id number])            D:  11/02/2008 14:50 PM by Dr. Gough Broom, MD ([id number])      T:  11/03/2008 06:53 AM by Jonathon Bellows          (  Conf: [id number]) (Doc ID: [id number])                  HY:QMVHQIO Welton Flakes MD

## 2011-07-01 ENCOUNTER — Telehealth: Payer: Self-pay | Admitting: Internal Medicine

## 2011-07-01 DIAGNOSIS — E785 Hyperlipidemia, unspecified: Secondary | ICD-10-CM

## 2011-07-01 MED ORDER — ATORVASTATIN CALCIUM 40 MG PO TABS: 40.0000 mg | ORAL_TABLET | Freq: Every day | ORAL | Status: DC

## 2011-07-01 NOTE — Telephone Encounter (Signed)
Refill- atorvastatin 40mg tablet. Take one tablet by mouth every day. Qty 30 last fill 7.31.12 °

## 2011-07-01 NOTE — Telephone Encounter (Signed)
Rx refill sent to pharmacy. 

## 2011-08-09 ENCOUNTER — Other Ambulatory Visit: Payer: Self-pay | Admitting: *Deleted

## 2011-08-09 DIAGNOSIS — E785 Hyperlipidemia, unspecified: Secondary | ICD-10-CM

## 2011-08-09 MED ORDER — ATORVASTATIN CALCIUM 40 MG PO TABS: 40.0000 mg | ORAL_TABLET | Freq: Every day | ORAL | Status: DC

## 2011-11-13 HISTORY — PX: MULTIPLE TOOTH EXTRACTIONS: SHX2053

## 2011-12-31 ENCOUNTER — Emergency Department (HOSPITAL_BASED_OUTPATIENT_CLINIC_OR_DEPARTMENT_OTHER)
Admission: EM | Admit: 2011-12-31 | Discharge: 2012-01-01 | Disposition: A | Discharge status: Home / Self Care | Payer: Managed Care, Other (non HMO) | Attending: Emergency Medicine | Admitting: Emergency Medicine

## 2011-12-31 ENCOUNTER — Emergency Department (HOSPITAL_BASED_OUTPATIENT_CLINIC_OR_DEPARTMENT_OTHER): Payer: Managed Care, Other (non HMO)

## 2011-12-31 ENCOUNTER — Encounter (HOSPITAL_BASED_OUTPATIENT_CLINIC_OR_DEPARTMENT_OTHER): Payer: Self-pay | Admitting: *Deleted

## 2011-12-31 DIAGNOSIS — S0990XA Unspecified injury of head, initial encounter: Secondary | ICD-10-CM | POA: Insufficient documentation

## 2011-12-31 DIAGNOSIS — Y9289 Other specified places as the place of occurrence of the external cause: Secondary | ICD-10-CM | POA: Insufficient documentation

## 2011-12-31 DIAGNOSIS — R040 Epistaxis: Secondary | ICD-10-CM

## 2011-12-31 DIAGNOSIS — Z87891 Personal history of nicotine dependence: Secondary | ICD-10-CM | POA: Insufficient documentation

## 2011-12-31 DIAGNOSIS — E785 Hyperlipidemia, unspecified: Secondary | ICD-10-CM | POA: Insufficient documentation

## 2011-12-31 DIAGNOSIS — W2209XA Striking against other stationary object, initial encounter: Secondary | ICD-10-CM | POA: Insufficient documentation

## 2011-12-31 LAB — CBC
MCH: 31 pg (ref 26.0–34.0)
MCHC: 33.7 g/dL (ref 30.0–36.0)
Platelets: 333 10*3/uL (ref 150–400)

## 2011-12-31 LAB — DIFFERENTIAL
Basophils Absolute: 0 10*3/uL (ref 0.0–0.1)
Basophils Relative: 1 % (ref 0–1)
Eosinophils Absolute: 0.1 10*3/uL (ref 0.0–0.7)
Neutro Abs: 2.8 10*3/uL (ref 1.7–7.7)
Neutrophils Relative %: 44 % (ref 43–77)

## 2011-12-31 NOTE — ED Notes (Signed)
Pt states head injury last week NO LOC. Pt report h/a and nosebleed

## 2012-01-01 LAB — BASIC METABOLIC PANEL
Chloride: 104 mEq/L (ref 96–112)
GFR calc Af Amer: 82 mL/min — ABNORMAL LOW (ref >90)
GFR calc non Af Amer: 71 mL/min — ABNORMAL LOW (ref >90)
Potassium: 3.7 mEq/L (ref 3.5–5.1)
Sodium: 142 mEq/L (ref 135–145)

## 2012-01-01 NOTE — Discharge Instructions (Signed)
Blunt Trauma °You have been evaluated for injuries. You have been examined and your caregiver has not found injuries serious enough to require hospitalization. °It is common to have multiple bruises and sore muscles following an accident. These tend to feel worse for the first 24 hours. You will feel more stiffness and soreness over the next several hours and worse when you wake up the first morning after your accident. After this point, you should begin to improve with each passing day. The amount of improvement depends on the amount of damage done in the accident. °Following your accident, if some part of your body does not work as it should, or if the pain in any area continues to increase, you should return to the Emergency Department for re-evaluation.  °HOME CARE INSTRUCTIONS  °Routine care for sore areas should include: °· Ice to sore areas every 2 hours for 20 minutes while awake for the next 2 days.  °· Drink extra fluids (not alcohol).  °· Take a hot or warm shower or bath once or twice a day to increase blood flow to sore muscles. This will help you "limber up".  °· Activity as tolerated. Lifting may aggravate neck or back pain.  °· Only take over-the-counter or prescription medicines for pain, discomfort, or fever as directed by your caregiver. Do not use aspirin. This may increase bruising or increase bleeding if there are small areas where this is happening.  °SEEK IMMEDIATE MEDICAL CARE IF: °· Numbness, tingling, weakness, or problem with the use of your arms or legs.  °· A severe headache is not relieved with medications.  °· There is a change in bowel or bladder control.  °· Increasing pain in any areas of the body.  °· Short of breath or dizzy.  °· Nauseated, vomiting, or sweating.  °· Increasing belly (abdominal) discomfort.  °· Blood in urine, stool, or vomiting blood.  °· Pain in either shoulder in an area where a shoulder strap would be.  °· Feelings of lightheadedness or if you have a fainting  episode.  °Sometimes it is not possible to identify all injuries immediately after the trauma. It is important that you continue to monitor your condition after the emergency department visit. If you feel you are not improving, or improving more slowly than should be expected, call your physician. If you feel your symptoms (problems) are worsening, return to the Emergency Department immediately. °Document Released: 03/27/2001 Document Revised: 06/20/2011 Document Reviewed: 02/17/2008 °ExitCare® Patient Information ©2012 [employer]. °

## 2012-01-01 NOTE — ED Notes (Signed)
Pt returned from radiology.

## 2012-01-01 NOTE — ED Provider Notes (Signed)
History     CSN: [id number]  Arrival date & time 12/31/11  2155   First MD Initiated Contact with Patient 12/31/11 2331      Chief Complaint  Patient presents with  . Headache  . Epistaxis    (Consider location/radiation/quality/duration/timing/severity/associated sxs/prior treatment) Patient is a 56 y.o. female presenting with nosebleeds and head injury. The history is provided by the patient.  Epistaxis  This is a new problem. The current episode started 6 to 12 hours ago. The problem occurs constantly. The problem has been resolved. The problem is associated with an unknown factor. The bleeding has been from both nares. She has tried nothing for the symptoms. The treatment provided significant relief. Her past medical history does not include bleeding disorder or frequent nosebleeds.  Head Injury  The incident occurred more than 1 week ago. She came to the ER via walk-in. The injury mechanism was a direct blow. There was no loss of consciousness. There was no blood loss. The quality of the pain is described as sharp. The pain is at a severity of 4/10. The pain is mild. The pain has been constant since the injury. Pertinent negatives include no numbness, no blurred vision, no vomiting, no tinnitus, patient does not experience disorientation, no weakness and no memory loss. She has tried nothing for the symptoms. The treatment provided no relief.  Though that perhaps bumping her head on an apparatus in her cubicle at work over a week ago caused her one hour of nosebleed this evening  Past Medical History  Diagnosis Date  . Hyperlipidemia   . History of tobacco abuse     Past Surgical History  Procedure Date  . Total abdominal hysterectomy     Family History  Problem Relation Age of Onset  . Ovarian cancer Mother     2008  . Osteoporosis Mother     History  Substance Use Topics  . Smoking status: Former Games developer  . Smokeless tobacco: Not on file  . Alcohol Use: Not on file      OB History    Grav Para Term Preterm Abortions TAB SAB Ect Mult Living                  Review of Systems  HENT: Positive for nosebleeds. Negative for tinnitus.   Eyes: Negative for blurred vision.  Gastrointestinal: Negative for vomiting.  Neurological: Positive for headaches. Negative for weakness and numbness.  Psychiatric/Behavioral: Negative for memory loss.  All other systems reviewed and are negative.    Allergies  Penicillins and Sulfonamide derivatives  Home Medications   Current Outpatient Rx  Name Route Sig Dispense Refill  . ATORVASTATIN CALCIUM 40 MG PO TABS Oral Take 1 tablet (40 mg total) by mouth daily. 90 tablet 1    BP 122/94  Pulse 69  Temp 98.2 F (36.8 C)  Resp 16  Ht 5\' 4"  (1.626 m)  Wt 132 lb (59.875 kg)  BMI 22.66 kg/m2  SpO2 100%  Physical Exam  Constitutional: She is oriented to person, place, and time. She appears well-developed and well-nourished. No distress.  HENT:  Head: Normocephalic and atraumatic.  Right Ear: No hemotympanum.  Left Ear: No hemotympanum.  Nose: Rhinorrhea present. No nasal deformity or nasal septal hematoma.  Mouth/Throat: Oropharynx is clear and moist.       Erythema but no active bleeding at right kisselbachs plexus  Eyes: Conjunctivae are normal. Pupils are equal, round, and reactive to light.  Neck: Normal range of  motion. Neck supple.  Cardiovascular: Normal rate and regular rhythm.   Pulmonary/Chest: Effort normal and breath sounds normal.  Abdominal: Soft. Bowel sounds are normal. There is no tenderness.  Musculoskeletal: Normal range of motion. She exhibits no tenderness.  Neurological: She is alert and oriented to person, place, and time. She has normal reflexes.  Skin: Skin is warm and dry.  Psychiatric: She has a normal mood and affect.    ED Course  Procedures (including critical care time)  Labs Reviewed  CBC - Abnormal; Notable for the following:    RBC 3.77 (*)     Hemoglobin 11.7 (*)      HCT 34.7 (*)     All other components within normal limits  BASIC METABOLIC PANEL - Abnormal; Notable for the following:    GFR calc non Af Amer 71 (*)     GFR calc Af Amer 82 (*)     All other components within normal limits  DIFFERENTIAL   No results found.   No diagnosis found.    MDM  No nasal bone injury.  Head injury not related to epistaxis.  Suspect change in barometric pressure as source of bleeding.  Return for recurrent bleeding or any concerns         Gratia Disla K Christelle Igoe-Rasch, MD 01/01/12 [phone]

## 2012-03-11 ENCOUNTER — Encounter: Payer: Self-pay | Admitting: Gastroenterology

## 2012-03-13 ENCOUNTER — Other Ambulatory Visit: Payer: Self-pay | Admitting: Obstetrics and Gynecology

## 2012-03-13 ENCOUNTER — Encounter: Payer: Self-pay | Admitting: Gastroenterology

## 2012-03-13 DIAGNOSIS — Z1231 Encounter for screening mammogram for malignant neoplasm of breast: Secondary | ICD-10-CM

## 2012-03-31 ENCOUNTER — Ambulatory Visit (INDEPENDENT_AMBULATORY_CARE_PROVIDER_SITE_OTHER): Payer: Managed Care, Other (non HMO) | Admitting: Internal Medicine

## 2012-03-31 ENCOUNTER — Encounter: Payer: Self-pay | Admitting: Internal Medicine

## 2012-03-31 VITALS — BP 132/84 | HR 76 | Temp 98.0°F | Wt 128.0 lb

## 2012-03-31 DIAGNOSIS — M940 Chondrocostal junction syndrome [Tietze]: Secondary | ICD-10-CM

## 2012-03-31 NOTE — Progress Notes (Signed)
  Subjective:    Patient ID: Leslie Boyer, female    DOB: 1955-11-04, 56 y.o.   MRN: [id number]  HPI  56 year old African American female complains of intermittent chest wall pain the last 2-3 weeks. Her symptoms can be of right or left-sided. It is nonexertional. Symptoms may be related to her picking up her 9-year-old granddaughter who weighs 40 pounds. Symptoms seem to be slightly worse when she is sleeping or lying on her side. It is no worse with deep inhalation. She denies cough or shortness of breath. Pain does not radiate.  She has noticed tenderness in her upper ribs.  No breast complaints.  Review of Systems See HPI  Past Medical History  Diagnosis Date  . Hyperlipidemia   . History of tobacco abuse     History   Social History  . Marital Status: Divorced    Spouse Name: N/A    Number of Children: N/A  . Years of Education: N/A   Occupational History  . Not on file.   Social History Main Topics  . Smoking status: Former Games developer  . Smokeless tobacco: Not on file  . Alcohol Use: Not on file  . Drug Use: No  . Sexually Active: Yes    Birth Control/ Protection: Surgical   Other Topics Concern  . Not on file   Social History Narrative   Former [employer] use-no  Occupation:  [employer]  3 daughters1 sonDivorced  (husband had drug problem)     Past Surgical History  Procedure Date  . Total abdominal hysterectomy     Family History  Problem Relation Age of Onset  . Ovarian cancer Mother     2008  . Osteoporosis Mother     Allergies  Allergen Reactions  . Penicillins   . Sulfonamide Derivatives     REACTION: Hives    Current Outpatient Prescriptions on File Prior to Visit  Medication Sig Dispense Refill  . atorvastatin (LIPITOR) 40 MG tablet Take 1 tablet (40 mg total) by mouth daily.  90 tablet  1    BP 132/84  Pulse 76  Temp 98 F (36.7 C) (Oral)  Wt 128 lb (58.06 kg)       Objective:   Physical Exam  Constitutional: She is  oriented to person, place, and time. She appears well-developed and well-nourished.  Cardiovascular: Normal rate, regular rhythm and normal heart sounds.   Pulmonary/Chest: Effort normal and breath sounds normal. She has no wheezes.       Mild upper rib tenderness  Musculoskeletal: She exhibits no edema.  Neurological: She is alert and oriented to person, place, and time.  Skin: Skin is warm and dry.          Assessment & Plan:

## 2012-03-31 NOTE — Patient Instructions (Addendum)
Please call our office if your chest symptoms do not improve or gets worse. Use ibuprofen as directed Please provide [place] with a copy of your mammogram

## 2012-03-31 NOTE — Assessment & Plan Note (Signed)
Patient experiencing Intermittent chest wall/rib pain. I suspect her symptoms are secondary to costochondritis. Patient advised to use ibuprofen 400 mg every 12 hours as needed for one to 2 weeks.  Patient advised to call office if symptoms persist or worsen.

## 2012-04-08 ENCOUNTER — Ambulatory Visit: Payer: Commercial Managed Care - POS

## 2012-04-17 ENCOUNTER — Encounter: Payer: Self-pay | Admitting: Gastroenterology

## 2012-04-17 ENCOUNTER — Ambulatory Visit (AMBULATORY_SURGERY_CENTER): Payer: Managed Care, Other (non HMO) | Admitting: *Deleted

## 2012-04-17 VITALS — Ht 64.0 in | Wt 129.0 lb

## 2012-04-17 DIAGNOSIS — Z1211 Encounter for screening for malignant neoplasm of colon: Secondary | ICD-10-CM

## 2012-04-17 MED ORDER — MOVIPREP 100 G PO SOLR: ORAL | Status: DC

## 2012-04-27 ENCOUNTER — Ambulatory Visit (AMBULATORY_SURGERY_CENTER): Payer: Managed Care, Other (non HMO) | Admitting: Gastroenterology

## 2012-04-27 ENCOUNTER — Encounter: Payer: Self-pay | Admitting: Gastroenterology

## 2012-04-27 ENCOUNTER — Encounter: Payer: Managed Care, Other (non HMO) | Admitting: Gastroenterology

## 2012-04-27 VITALS — BP 106/77 | HR 56 | Temp 96.8°F | Resp 31 | Ht 64.0 in | Wt 129.0 lb

## 2012-04-27 DIAGNOSIS — Z1211 Encounter for screening for malignant neoplasm of colon: Secondary | ICD-10-CM

## 2012-04-27 DIAGNOSIS — D126 Benign neoplasm of colon, unspecified: Secondary | ICD-10-CM

## 2012-04-27 MED ORDER — SODIUM CHLORIDE 0.9 % IV SOLN: 500.0000 mL | INTRAVENOUS | Status: DC

## 2012-04-27 NOTE — Patient Instructions (Addendum)
YOU HAD AN ENDOSCOPIC PROCEDURE TODAY AT THE [facility]: Refer to the procedure report that was given to you for any specific questions about what was found during the examination.  If the procedure report does not answer your questions, please call your gastroenterologist to clarify.  If you requested that your care partner not be given the details of your procedure findings, then the procedure report has been included in a sealed envelope for you to review at your convenience later.  YOU SHOULD EXPECT: Some feelings of bloating in the abdomen. Passage of more gas than usual.  Walking can help get rid of the air that was put into your GI tract during the procedure and reduce the bloating. If you had a lower endoscopy (such as a colonoscopy or flexible sigmoidoscopy) you may notice spotting of blood in your stool or on the toilet paper. If you underwent a bowel prep for your procedure, then you may not have a normal bowel movement for a few days.  DIET: Your first meal following the procedure should be a light meal and then it is ok to progress to your normal diet.  A half-sandwich or bowl of soup is an example of a good first meal.  Heavy or fried foods are harder to digest and may make you feel nauseous or bloated.  Likewise meals heavy in dairy and vegetables can cause extra gas to form and this can also increase the bloating.  Drink plenty of fluids but you should avoid alcoholic beverages for 24 hours.  ACTIVITY: Your care partner should take you home directly after the procedure.  You should plan to take it easy, moving slowly for the rest of the day.  You can resume normal activity the day after the procedure however you should NOT DRIVE or use heavy machinery for 24 hours (because of the sedation medicines used during the test).    SYMPTOMS TO REPORT IMMEDIATELY: A gastroenterologist can be reached at any hour.  During normal business hours, 8:30 AM to 5:00 PM Monday through Friday,  call [phone].  After hours and on weekends, please call the GI answering service at [phone] who will take a message and have the physician on call contact you.   Following lower endoscopy (colonoscopy or flexible sigmoidoscopy):  Excessive amounts of blood in the stool  Significant tenderness or worsening of abdominal pains  Swelling of the abdomen that is new, acute  Fever of 100F or higher  Following upper endoscopy (EGD)  Vomiting of blood or coffee ground material  New chest pain or pain under the shoulder blades  Painful or persistently difficult swallowing  New shortness of breath  Fever of 100F or higher  Black, tarry-looking stools  FOLLOW UP: If any biopsies were taken you will be contacted by phone or by letter within the next 1-3 weeks.  Call your gastroenterologist if you have not heard about the biopsies in 3 weeks.  Our staff will call the home number listed on your records the next business day following your procedure to check on you and address any questions or concerns that you may have at that time regarding the information given to you following your procedure. This is a courtesy call and so if there is no answer at the home number and we have not heard from you through the emergency physician on call, we will assume that you have returned to your regular daily activities without incident.  SIGNATURES/CONFIDENTIALITY: You and/or your care   partner have signed paperwork which will be entered into your electronic medical record.  These signatures attest to the fact that that the information above on your After Visit Summary has been reviewed and is understood.  Full responsibility of the confidentiality of this discharge information lies with you and/or your care-partner.  

## 2012-04-27 NOTE — Op Note (Signed)
[facility] [address] [place], [address]   COLONOSCOPY PROCEDURE REPORT  PATIENT: Leslie Boyer, Leslie Boyer  MR#: [id number] BIRTHDATE: 09/05/1955 , 55  yrs. old GENDER: Female ENDOSCOPIST: Mardella Layman, MD, [facility] REFERRED BY:  Thomos Lemons, DO PROCEDURE DATE:  04/27/2012 PROCEDURE:   Colonoscopy with snare polypectomy ASA CLASS:   Class II INDICATIONS:patient's personal history of colon polyps. MEDICATIONS: Fentanyl-Detailed 280 mg IV  DESCRIPTION OF PROCEDURE:   After the risks and benefits and of the procedure were explained, informed consent was obtained.  A digital rectal exam revealed no abnormalities of the rectum.    The LB [id number]  endoscope was introduced through the anus and advanced to the cecum, which was identified by both the appendix and ileocecal valve .  The quality of the prep was excellent, using MoviPrep .  The instrument was then slowly withdrawn as the colon was fully examined.     COLON FINDINGS: A small smooth flat polyp, ranging between 3-73mm in size, was found in the rectum.  A polypectomy was performed using snare cautery.  The resection was complete and the polyp tissue was completely retrieved.   A smooth sessile polyp ranging between 3-52mm in size was found at the hepatic flexure.  A polypectomy was performed using snare cautery.  The resection was complete and the polyp tissue was completely retrieved.   The colon mucosa was otherwise normal.     Retroflexed views revealed no abnormalities. The scope was then withdrawn from the patient and the procedure completed.  COMPLICATIONS: There were no complications. ENDOSCOPIC IMPRESSION: Small flat polyp, ranging between 3-63mm in size, was found in the rectum; polypectomy was performed using snare cautery .Polyp also removed at hepatic flexure.  RECOMMENDATIONS: 1.  Repeat colonoscopy in 5 years if polyp adenomatous; otherwise 10 years 2.  await pathology  results   REPEAT EXAM:  cc:  _______________________________ eSignedMardella Layman, MD, [facility] 04/27/2012 10:02 AM

## 2012-04-27 NOTE — Progress Notes (Signed)
Patient did not experience any of the following events: a burn prior to discharge; a fall within the facility; wrong site/side/patient/procedure/implant event; or a hospital transfer or hospital admission upon discharge from the facility. (G8907) Patient did not have preoperative order for IV antibiotic SSI prophylaxis. (G8918)  

## 2012-04-28 ENCOUNTER — Telehealth: Payer: Self-pay | Admitting: *Deleted

## 2012-04-28 NOTE — Telephone Encounter (Signed)
No answer, identifier. Message left to call if any questions or concerns.

## 2012-05-01 ENCOUNTER — Encounter: Payer: Self-pay | Admitting: Gastroenterology

## 2012-05-05 ENCOUNTER — Ambulatory Visit
Admission: RE | Admit: 2012-05-05 | Discharge: 2012-05-05 | Disposition: A | Discharge status: Home / Self Care | Payer: Exclusive Provider Organization | Source: Ambulatory Visit | Attending: Obstetrics and Gynecology | Admitting: Obstetrics and Gynecology

## 2012-05-05 DIAGNOSIS — Z1231 Encounter for screening mammogram for malignant neoplasm of breast: Secondary | ICD-10-CM | POA: Insufficient documentation

## 2012-05-12 ENCOUNTER — Telehealth: Payer: Self-pay | Admitting: Gastroenterology

## 2012-05-13 NOTE — Telephone Encounter (Signed)
Pt had questions concerning the letter from [place] , particularly Serrated Polyps. Tried to explain the different types of polyps with severity and pt and I agreed that I will mail her info from [employer] as well as her path report. Informed pt to call me when received if they have questions. Pt asked about gas and I informed her it got worse as we age. She may try OTC GASX, Simethicone, and I will send her coupons for Phazyme; pt stated understanding.

## 2012-05-13 NOTE — Telephone Encounter (Signed)
lmom for pt to call back. I could not get the work number to work; called on her cell.

## 2012-05-14 ENCOUNTER — Encounter (INDEPENDENT_AMBULATORY_CARE_PROVIDER_SITE_OTHER): Payer: Self-pay

## 2012-05-15 ENCOUNTER — Other Ambulatory Visit: Payer: Self-pay | Admitting: Internal Medicine

## 2012-05-15 DIAGNOSIS — D259 Leiomyoma of uterus, unspecified: Secondary | ICD-10-CM

## 2012-05-15 DIAGNOSIS — N83201 Unspecified ovarian cyst, right side: Secondary | ICD-10-CM

## 2012-05-15 HISTORY — DX: Leiomyoma of uterus, unspecified: D25.9

## 2012-05-15 HISTORY — DX: Unspecified ovarian cyst, right side: N83.201

## 2012-05-21 ENCOUNTER — Encounter (INDEPENDENT_AMBULATORY_CARE_PROVIDER_SITE_OTHER): Payer: Self-pay | Admitting: Cardiovascular Disease

## 2012-06-04 ENCOUNTER — Telehealth: Payer: Self-pay | Admitting: Gastroenterology

## 2012-06-04 NOTE — Telephone Encounter (Signed)
Left message for patient to call back again.  She left me a Engineer, technical sales

## 2012-06-04 NOTE — Telephone Encounter (Signed)
Left message for patient to call back  

## 2012-07-07 NOTE — Procedures (Unsigned)
PATIENT NAME:  [name] [name], [name] [name]         MED REC #:  [id number]            ORD. DR.:  Ricard Dillon, MD            DATE OF EXAM:  09/03/2002            ALT. DR.:                                NS/BED:  4C  419 01            D.O.B.:  09/22/1955     AGE:  56        ACCT.#:  [id number]                                                       ECHOCARDIOGRAM REPORT                                                                ECHOCARDIOGRAM                                              M-MODE COMMENTS                 LA DIMENSION             3.8  cm.  (1.5-4.0)           AORTIC ROOT              3.5  cm.  (2.0-3.7)           LVD DIMENSION            5.2  cm.  (3.7-5.6)           LVS DIMENSION            3.2  cm.  (2.0-4.0)           LV PW THICKNESS          1    cm.  (0.6-1.1)           IVS THICKNESS            0.8  cm.  (0.6-1.1)           FRACTIONAL SHORTENING         %    (24-64%)           LVEF                     67   %           MITRAL VALVE EXCURSION        mm           SLOPE AML                     mm/s (>12mm/sec)  RVd DIMENSION            1.3  cm.  (0.7-2.3)                                                    2-D            MITRAL VALVE:      AORTIC VALVE:      LEFT VENTRICULAR WALL MOTION:      LEFT VENTRICULAR CAVITY:      RIGHT HEART:            EXAMINATION:  Echocardiogram/2-D/M-Mode Comp.            ECHOCARDIOGRAM FINDINGS      1.   Normal left ventricular wall thickness, cavity size and systolic      function with an estimated ejection fraction of 65-70%.      2.   Decreased left ventricular compliance.      3.   Normal left atrial, right atrial, right ventricular and aortic root      dimensions.      4.   Morphologically normal heart valves.      5.   Trace tricuspid regurgitation.      6.   Mild pulmonary hypertension.                        Dictating Physician:  [place]  [id number]                  ______________________________________________  __________      _______________       Signature of Reviewing Physician                      Physician # Date of      Signature            S [id number]      D: 09/03/2002  6:57 P      T: 09/06/2002  6:33 A      J: [id number]      N: [id number]      cc:   Oneita Jolly, MD         Particia Jasper, MD                        Fax/Mailed:

## 2012-07-24 ENCOUNTER — Telehealth: Payer: Self-pay | Admitting: Internal Medicine

## 2012-07-24 NOTE — Telephone Encounter (Signed)
Patient called to office. Attempted to return call. No answer. Message left on voicemail to call back for assistance if needed

## 2012-07-27 ENCOUNTER — Ambulatory Visit (INDEPENDENT_AMBULATORY_CARE_PROVIDER_SITE_OTHER): Payer: Managed Care, Other (non HMO) | Admitting: Internal Medicine

## 2012-07-27 ENCOUNTER — Encounter: Payer: Self-pay | Admitting: Internal Medicine

## 2012-07-27 ENCOUNTER — Telehealth: Payer: Self-pay | Admitting: Internal Medicine

## 2012-07-27 VITALS — BP 122/80 | Temp 98.2°F | Wt 119.0 lb

## 2012-07-27 DIAGNOSIS — R197 Diarrhea, unspecified: Secondary | ICD-10-CM

## 2012-07-27 DIAGNOSIS — R1013 Epigastric pain: Secondary | ICD-10-CM | POA: Insufficient documentation

## 2012-07-27 DIAGNOSIS — R634 Abnormal weight loss: Secondary | ICD-10-CM

## 2012-07-27 DIAGNOSIS — K3189 Other diseases of stomach and duodenum: Secondary | ICD-10-CM

## 2012-07-27 DIAGNOSIS — E785 Hyperlipidemia, unspecified: Secondary | ICD-10-CM

## 2012-07-27 NOTE — Assessment & Plan Note (Signed)
57 year old [place] American female with approximately 15 pound weight loss for last 6 months. She had oral surgery with new dentures. This is likely reason for weight loss. She had colonoscopy in October of 2013. It showed 2 small polyps. Otherwise it was unremarkable. Rule out thyroid disorder. Obtain thyroid studies. Patient encouraged to resume normal diet.

## 2012-07-27 NOTE — Patient Instructions (Signed)
Use Miralax and Metamucil over-the-counter once daily Take probiotic supplement (Culturelle) once daily Take lactase supplement when consuming dairy products

## 2012-07-27 NOTE — Progress Notes (Signed)
Subjective:    Patient ID: Leslie Boyer, female    DOB: 05-11-1956, 57 y.o.   MRN: [id number]  HPI  57 year old [place] American female with several complaints. Over the last 3-6 months patient has noticed proximally 16 pound weight loss. She had major dental work done recently and her dentist warned patient she may experience weight loss. She is finally getting used to her dentures. However she has also been on a fast with her church and has been avoiding meats, sweets and bread.  Patient also complains of chronic flatulence. She's had this problem most of her life but reports that it has been worse over the last 6 months. She denies any abdominal bloating. Her bowel movements are irregular. At times she is constipated and others she has loose stools.  Despite the fact that she is lactose intolerant, she still consumes dairy products.  Patient completed colonoscopy in October of 2013-Dr. Jarold Motto  Review of Systems Negative for abdominal pain, negative for dysphasia  Past Medical History  Diagnosis Date  . Hyperlipidemia   . History of tobacco abuse   . 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 Osteopenia     History   Social History  . Marital Status: Divorced    Spouse Name: N/A    Number of Children: N/A  . Years of Education: N/A   Occupational History  . Not on file.   Social History Main Topics  . Smoking status: Former Games developer  . Smokeless tobacco: Never Used  . Alcohol Use: No  . Drug Use: No  . Sexually Active: Yes    Birth Control/ Protection: Surgical   Other Topics Concern  . Not on file   Social History Narrative   Former [employer] use-no  Occupation:  [employer]  3 daughters1 sonDivorced  (husband had drug problem)     Past Surgical History  Procedure Date  . Total abdominal hysterectomy   . Multiple tooth extractions 11/2011    Family History  Problem Relation Age of Onset  . Ovarian cancer Mother     2008  .  Osteoporosis Mother     Allergies  Allergen Reactions  . Sulfonamide Derivatives     REACTION: Hives  . Penicillins Rash    Current Outpatient Prescriptions on File Prior to Visit  Medication Sig Dispense Refill  . atorvastatin (LIPITOR) 40 MG tablet Take 1 tablet (40 mg total) by mouth daily.  90 tablet  1  . Multiple Vitamins-Minerals (HAIR VITAMINS PO) Take 1 tablet by mouth daily.        BP 122/80  Temp 98.2 F (36.8 C) (Oral)  Wt 119 lb (53.978 kg)       Objective:   Physical Exam  Constitutional: She appears well-developed and well-nourished.  HENT:  Head: Normocephalic and atraumatic.  Right Ear: External ear normal.  Left Ear: External ear normal.  Eyes: EOM are normal. Pupils are equal, round, and reactive to light.  Neck: Neck supple. No thyromegaly present.  Cardiovascular: Normal rate and regular rhythm.   Pulmonary/Chest: Effort normal and breath sounds normal. She has no wheezes.  Abdominal: Soft. Bowel sounds are normal. She exhibits no mass. There is no rebound and no guarding.       Mild lower abdominal tenderness  Musculoskeletal: She exhibits no edema.  Psychiatric: She has a normal mood and affect. Her behavior is normal.          Assessment & Plan:

## 2012-07-27 NOTE — Assessment & Plan Note (Addendum)
Patient complains of increased flatulence and irregular bowel movements. Patient advised to start daily MiraLax and psyllium fiber laxative. Patient also advised to avoid dairy products or use lactase enzyme.  Trial of over-the-counter probiotic supplement. Reassess in 2 months  Patient completed colonoscopy in October of 2013. This only notable for 2 small polyps - otherwise unremarkable.

## 2012-07-27 NOTE — Telephone Encounter (Signed)
[facility] Triage Call Report Triage Record Num: [id number] Operator: Rebeca Allegra Patient Name: Leslie Boyer Call Date & Time: 07/24/2012 6:03:26PM Patient Phone: [phone] PCP: Thomos Lemons Patient Gender: Female PCP Fax : [phone] Patient DOB: 03/05/1956 [facility] Reason for Call: Caller: [place]on/Patient; PCP: Ethelene Hal Molly Maduro) (Adults only); CB#: [phone]; Call regarding Diarrhea; onset x2-3 weeks with weight loss and excessive gas. Denies excessive thirst or frequent urination. Afebrile. All emergent symptoms ruled out per Diarrhea or Other Change protocol. Pt already as an appt scheduled for 07/27/12 1530. Home care advice given. Protocol(s) Used: Diarrhea or Other Change in Bowel Habits Recommended Outcome per Protocol: Provide Home/Self Care Reason for Outcome: All other situations Care Advice: Diarrheal Care: - Drink 2-3 quarts (2-3 liters) per day of low sugar content fluids, including over the counter oral hydration solution, unless directed otherwise by provider. - If accompanied by vomiting, take the fluids in frequent small sips or suck on ice chips. - Eat easily digested foods (such as bananas, rice, applesauce, toast, cooked cereals, soup, crackers, baked or boiled potato, or baked chicken or [place] without skin). - Do not eat high fiber, high fat, high sugar content foods, or highly seasoned foods. - Do not drink caffeinated or alcoholic beverages. - Avoid milk and milk products while having symptoms. As symptoms improve, gradually add back to diet. - Application of A&D ointment or witch hazel medicated pads may help anal irritation. - Antidiarrheal medications are usually unnecessary. If symptoms are severe, consider nonprescription antidiarrheal and anti-motility drugs as directed by label or a provider. Do not take if have high fever or bloody diarrhea. If pregnant, do not take any medications not approved by your  provider. - Consult your provider for advice regarding continuing prescription medication. ~ 01/10/

## 2012-08-06 ENCOUNTER — Encounter: Payer: Self-pay | Admitting: Internal Medicine

## 2012-08-07 ENCOUNTER — Other Ambulatory Visit (INDEPENDENT_AMBULATORY_CARE_PROVIDER_SITE_OTHER): Payer: Self-pay

## 2012-08-07 DIAGNOSIS — I2510 Atherosclerotic heart disease of native coronary artery without angina pectoris: Secondary | ICD-10-CM

## 2012-08-07 MED ORDER — SPIRONOLACTONE 50 MG PO TABS
50.00 mg | ORAL_TABLET | Freq: Every day | ORAL | Status: DC
Start: 2012-08-07 — End: 2012-08-13

## 2012-08-09 ENCOUNTER — Other Ambulatory Visit: Payer: Self-pay | Admitting: Internal Medicine

## 2012-08-11 ENCOUNTER — Encounter: Payer: Self-pay | Admitting: Internal Medicine

## 2012-08-13 ENCOUNTER — Other Ambulatory Visit (INDEPENDENT_AMBULATORY_CARE_PROVIDER_SITE_OTHER): Payer: Self-pay

## 2012-08-13 DIAGNOSIS — I2510 Atherosclerotic heart disease of native coronary artery without angina pectoris: Secondary | ICD-10-CM

## 2012-08-13 MED ORDER — SPIRONOLACTONE 50 MG PO TABS
50.00 mg | ORAL_TABLET | Freq: Every day | ORAL | Status: DC
Start: 2012-08-13 — End: 2014-08-15

## 2012-10-06 ENCOUNTER — Ambulatory Visit (INDEPENDENT_AMBULATORY_CARE_PROVIDER_SITE_OTHER): Payer: Exclusive Provider Organization | Admitting: Family Medicine

## 2012-10-13 ENCOUNTER — Ambulatory Visit (INDEPENDENT_AMBULATORY_CARE_PROVIDER_SITE_OTHER): Payer: Exclusive Provider Organization | Admitting: Family Medicine

## 2012-10-13 ENCOUNTER — Encounter (INDEPENDENT_AMBULATORY_CARE_PROVIDER_SITE_OTHER): Payer: Self-pay | Admitting: Family Medicine

## 2012-10-13 VITALS — BP 168/90 | HR 76 | Temp 98.3°F | Resp 16 | Ht 64.0 in | Wt 231.0 lb

## 2012-10-13 DIAGNOSIS — Z1211 Encounter for screening for malignant neoplasm of colon: Secondary | ICD-10-CM

## 2012-10-13 DIAGNOSIS — I10 Essential (primary) hypertension: Secondary | ICD-10-CM

## 2012-10-13 DIAGNOSIS — Z1212 Encounter for screening for malignant neoplasm of rectum: Secondary | ICD-10-CM

## 2012-10-13 DIAGNOSIS — R3989 Other symptoms and signs involving the genitourinary system: Secondary | ICD-10-CM

## 2012-10-13 DIAGNOSIS — R399 Unspecified symptoms and signs involving the genitourinary system: Secondary | ICD-10-CM

## 2012-10-13 LAB — POCT URINALYSIS DIPSTIX (10)(MULTI-TEST)
Bilirubin, UA POCT: NEGATIVE
Blood, UA POCT: NEGATIVE
Glucose, UA POCT: NEGATIVE mg/dL
Ketones, UA POCT: NEGATIVE mg/dL
Nitrite, UA POCT: NEGATIVE
POCT Spec Gravity, UA: 1.03 (ref 1.001–1.035)
POCT pH, UA: 6 (ref 5–8)
Urobilinogen, UA: 0.2 mg/dL

## 2012-10-13 NOTE — Progress Notes (Signed)
Has the patient sought any care outside of the [facility]? See cardiologist

## 2012-10-13 NOTE — Patient Instructions (Addendum)
May try Senokot to help move bowels.  Increase physical activity, fluids and fiber in diet.      Constipation (Adult)  Constipation is bowel movements that are less frequent than usual. Stools often become very hard and difficult to pass. This may lead to abdominal pain and bloating. It may also cause painful bowel movements. Constipation may be due to a diet that's low in fiber. Some medications, especially pain medications, can also cause it. Constipation may be treated with enemas, suppositories, laxatives or stool softeners. Your doctor will advise you which will work best for you. Follow the advice below to help avoid this problem in the future.    Home Care  Medication: Take any medicines as directed. Some laxatives are safe only for occasional use. Others can be taken on a regular basis. Talk to your doctor or pharmacist if you have questions.  General Care:    Prescription pain medications can cause constipation. If you are prescribed pain medications, ask the doctor whether you should also take a stool softener.   A diet high in fiber with plenty of fluids helps to maintain regular, soft bowel movements. The following foods are good sources of dietary fiber:   Cereals and breads: Whole grain cereal with bran, oatmeal, rolled oats, whole grain breads   Fruits: All fruits (fresh and dried), raisins, prunes, apricots, berries, figs   Vegetables: Any fresh vegetables, especially peas, broccoli, brussels sprouts, winter squash, green beans, cauliflower, lima beans, carrots   Other: Popcorn, brown rice  Drink plenty of water when you increase the amount of fiber you eat.  Follow Up  with your doctor or return to this facility if symptoms do not improve in the next few days. You may require further tests or a referral to a specialist.  Get Prompt Medical Attention  if any of the following occur:   Fever over 100.26F (38C)   Failure to resume normal bowel movements   Increasing abdominal or back  pain   Nausea or vomiting   Abdominal swelling   Blood in the stool   Weakness, dizziness or fainting   Unexpected vaginal bleeding   [address], [address], [place], [place] [address]. All rights reserved. This information is not intended as a substitute for professional medical care. Always follow your healthcare professional's instructions.

## 2012-10-13 NOTE — Progress Notes (Addendum)
Subjective:       Patient ID: [name] [name] is a 57 y.o. female.    Chief Complaint   Patient presents with   . Establish Care     NP   . Urinary Tract Infection Symptoms   . Hypertension         Urinary Tract Infection Symptoms   This is a recurrent problem. The problem occurs intermittently. The problem has been waxing and waning. The patient is experiencing no pain. There has been no fever. There is no history of pyelonephritis. Pertinent negatives include no flank pain, frequency, hematuria, hesitancy or urgency. Associated symptoms comments: Pelvic pressure (from fibroid). There is no history of recurrent UTIs.       Problem   Hypertension    On carvedilol 37.5mg  BID, spironolactone 50mg  QD, Diovan HCT 320/12.5mg  QD  Followed by cardio, Dr. Loree Fee (q6 mos)  Tolerating well, no ADR's  Tries to follow low salt diet and tries to follow regular activity  No meds taken today since did not pick up RF from pharmacy before this appt, to pick up on her way home from appt  To make appt with cardio    BP Readings from Last 3 Encounters:   10/13/12 168/90                The following portions of the patient's history were reviewed and updated as appropriate: allergies, current medications, past family history, past medical history, past social history, past surgical history and problem list.    Patient Active Problem List   Diagnosis   . Hypertension     Past Medical History   Diagnosis Date   . Left ventricular hypertrophy 2010     s/p echo   . Coronary artery disease, non-occlusive 2010     nonobstructive s/p cath 2010   . Hypertensive disorder 2004   . Pap smear for cervical cancer screening 07/25/2101     no hx of abnormal pap smear   . H/O mammogram 03/15/2102      normal    . Fibroid, uterine 05/2012   . Ovarian cyst, right 05/2012     Past Surgical History   Procedure Date   . Cardiac catheterization 2010     non-obs CAD   . Tubal ligation 1984   . Inguinal hernia repair 2004     right     Family History    Problem Relation Age of Onset   . Breast cancer Mother 21     breast ca    . Bone cancer Mother    . Heart failure Sister    . Heart failure Brother      History     Social History   . Marital Status: Married     Spouse Name: N/A     Number of Children: N/A   . Years of Education: N/A     Occupational History   . Not on file.     Social History Main Topics   . Smoking status: Never Smoker    . Smokeless tobacco: Never Used   . Alcohol Use: 0.0 oz/week      Comment: socially   . Drug Use: No   . Sexually Active: Yes -- Female partner(s)     Birth Control/ Protection: Surgical      Comment: s/p BTL     Other Topics Concern   . Not on file     Social History Narrative   . No  narrative on file     Current Outpatient Prescriptions   Medication Sig Dispense Refill   . carvedilol (COREG) 25 MG tablet Take 37.5 mg by mouth 2 (two) times daily with meals.        Marland Kitchen spironolactone (ALDACTONE) 50 MG tablet Take 1 tablet (50 mg total) by mouth daily.  90 tablet  3   . valsartan-hydrochlorothiazide (DIOVAN-HCT) 320-12.5 MG per tablet Take 1 tablet by mouth daily.         Allergies   Allergen Reactions   . Aspirin Shortness Of Breath and Rash   . Levaquin (Levofloxacin Hemihydrate)      Joint pains   . Tetanus Immune Globulin      Skin reaction           Review of Systems   Genitourinary: Negative for hesitancy, urgency, frequency, hematuria and flank pain.     Filed Vitals:    10/13/12 1414 10/13/12 1449   BP: 164/93 168/90   Pulse: 76    Temp: 98.3 F (36.8 C)    TempSrc: Oral    Resp: 16    Height: 1.626 m (5\' 4" )    Weight: 104.781 kg (231 lb)    SpO2: 97%            Objective:    Physical Exam   Vitals reviewed.  Constitutional: She appears well-developed and well-nourished. She does not appear ill.   Cardiovascular: Normal rate, regular rhythm and normal heart sounds.    Pulmonary/Chest: Effort normal and breath sounds normal. No respiratory distress.   Psychiatric: She has a normal mood and affect. Her behavior is normal.      (Note addended on 10/20/12 to include the PE findings above done at the time of visit on 10/13/12).      Assessment:       1. Hypertension  Comprehensive metabolic panel   2. UTI symptoms  POCT UA Dipstix (10)(Multi-Test)   3. Screening for colorectal cancer  Ambulatory referral to Gastroenterology           Plan:       Baseline CMP.  Make appt with cardio since due.  Info given since now IMG.  Refer to GI for screening colonoscopy.  FU in 3-6 months, sooner if needed.  Advanced directive paperwork given and explained to patient. Encouraged to complete and return to office.   Care plan letter to MyChart.    Galya Dunnigan T. Matilde Haymaker, MD

## 2012-10-14 ENCOUNTER — Encounter (INDEPENDENT_AMBULATORY_CARE_PROVIDER_SITE_OTHER): Payer: Self-pay | Admitting: Family Medicine

## 2012-10-14 ENCOUNTER — Other Ambulatory Visit (INDEPENDENT_AMBULATORY_CARE_PROVIDER_SITE_OTHER): Payer: Self-pay

## 2012-10-14 ENCOUNTER — Other Ambulatory Visit (INDEPENDENT_AMBULATORY_CARE_PROVIDER_SITE_OTHER): Payer: Self-pay | Admitting: Family Medicine

## 2012-10-14 DIAGNOSIS — R7989 Other specified abnormal findings of blood chemistry: Secondary | ICD-10-CM | POA: Insufficient documentation

## 2012-10-14 LAB — COMPREHENSIVE METABOLIC PANEL
ALT: 14 U/L (ref 6–29)
AST (SGOT): 18 U/L (ref 10–35)
Albumin/Globulin Ratio: 1.3 (ref 1.0–2.5)
Albumin: 4.7 G/DL (ref 3.6–5.1)
Alkaline Phosphatase: 54 U/L (ref 33–130)
BUN / Creatinine Ratio: 17.1 (ref 6–22)
BUN: 19 MG/DL (ref 7–25)
Bilirubin, Total: 0.4 MG/DL (ref 0.2–1.2)
CO2: 25 mmol/L (ref 19–30)
Calcium: 10.9 MG/DL — ABNORMAL HIGH (ref 8.6–10.4)
Chloride: 102 mmol/L (ref 98–110)
Creatinine: 1.11 mg/dL — ABNORMAL HIGH (ref 0.50–1.05)
EGFR African American: 64 mL/min/{1.73_m2} (ref >=60)
EGFR: 55 mL/min/{1.73_m2} — AB (ref >=60)
Globulin: 3.7 G/DL (ref 1.9–3.7)
Glucose: 82 MG/DL (ref 65–99)
Potassium: 4.7 mmol/L (ref 3.5–5.3)
Protein, Total: 8.4 G/DL — ABNORMAL HIGH (ref 6.1–8.1)
Sodium: 138 mmol/L (ref 135–146)

## 2012-10-14 MED ORDER — VALSARTAN-HYDROCHLOROTHIAZIDE 320-12.5 MG PO TABS
1.00 | ORAL_TABLET | Freq: Every day | ORAL | Status: DC
Start: 2012-10-14 — End: 2014-06-22

## 2012-12-04 ENCOUNTER — Encounter (INDEPENDENT_AMBULATORY_CARE_PROVIDER_SITE_OTHER): Payer: Self-pay | Admitting: Family Medicine

## 2012-12-21 ENCOUNTER — Other Ambulatory Visit (INDEPENDENT_AMBULATORY_CARE_PROVIDER_SITE_OTHER): Payer: Self-pay

## 2012-12-21 DIAGNOSIS — I2510 Atherosclerotic heart disease of native coronary artery without angina pectoris: Secondary | ICD-10-CM

## 2012-12-21 DIAGNOSIS — I10 Essential (primary) hypertension: Secondary | ICD-10-CM

## 2012-12-21 MED ORDER — CARVEDILOL 25 MG PO TABS
37.50 mg | ORAL_TABLET | Freq: Two times a day (BID) | ORAL | Status: DC
Start: 2012-12-21 — End: 2013-08-06

## 2013-03-19 ENCOUNTER — Ambulatory Visit (INDEPENDENT_AMBULATORY_CARE_PROVIDER_SITE_OTHER): Payer: Exclusive Provider Organization | Admitting: Family Medicine

## 2013-03-22 ENCOUNTER — Ambulatory Visit (INDEPENDENT_AMBULATORY_CARE_PROVIDER_SITE_OTHER): Payer: Exclusive Provider Organization | Admitting: Family Medicine

## 2013-03-22 ENCOUNTER — Encounter (INDEPENDENT_AMBULATORY_CARE_PROVIDER_SITE_OTHER): Payer: Self-pay | Admitting: Family Medicine

## 2013-03-22 VITALS — BP 150/81 | HR 73 | Temp 98.6°F | Resp 14 | Wt 232.4 lb

## 2013-03-22 MED ORDER — AMOXICILLIN-POT CLAVULANATE 875-125 MG PO TABS
1.00 | ORAL_TABLET | Freq: Two times a day (BID) | ORAL | Status: AC
Start: 2013-03-22 — End: 2013-04-01

## 2013-03-22 NOTE — Progress Notes (Signed)
Subjective:       Patient ID: [name] [name] is a 57 y.o. female.    Chief Complaint   Patient presents with   . URI     Pt c/o cough , congestion for a week .            URI   This is a new problem. The current episode started 1 to 4 weeks ago (1 wk). The problem has been gradually worsening. There has been no fever. Associated symptoms include congestion, coughing, headaches, sinus pain and vomiting. Pertinent negatives include no abdominal pain, chest pain, diarrhea, ear pain, nausea, neck pain, plugged ear sensation, rhinorrhea, sore throat, swollen glands (initially, none now) or wheezing. She has tried NSAIDs (homeopathic cough syrup, Airborne) for the symptoms. The treatment provided mild relief.     Hx of pneumonia x 2.   Sick contacts at home.     Problem   Hypertension    On carvedilol 37.5mg  BID, spironolactone 50mg  QD, Diovan HCT 320/12.5mg  QD  Followed by cardio, Dr. Loree Fee (q6 mos)  Tolerating well, no ADR's  Home BP 130s/80s  Tries to follow low salt diet and tries to follow regular activity    BP Readings from Last 3 Encounters:   03/22/13 150/81   10/13/12 168/90                    The following portions of the patient's history were reviewed and updated as appropriate: allergies, current medications, past medical history, past social history, past surgical history and problem list.    Review of Systems   Constitutional: Positive for fatigue. Negative for fever and activity change.   HENT: Positive for congestion and sinus pressure. Negative for ear pain, sore throat, rhinorrhea, neck pain and postnasal drip.    Respiratory: Positive for cough. Negative for shortness of breath and wheezing.    Cardiovascular: Negative for chest pain and palpitations.   Gastrointestinal: Positive for vomiting. Negative for nausea, abdominal pain and diarrhea.   Neurological: Positive for headaches. Negative for dizziness.   Psychiatric/Behavioral: Negative for sleep disturbance.     Filed Vitals:    03/22/13  1036 03/22/13 1224   BP: 166/85 150/81   Pulse: 73 73   Temp: 98.6 F (37 C)    TempSrc: Oral    Resp: 14    Weight: 105.416 kg (232 lb 6.4 oz)    SpO2: 98%            Objective:    Physical Exam   Vitals reviewed.  Constitutional: She appears well-developed and well-nourished. She does not have a sickly appearance. She appears ill.   HENT:   Right Ear: External ear and ear canal normal. Tympanic membrane is not bulging. No middle ear effusion.   Left Ear: Tympanic membrane, external ear and ear canal normal. Tympanic membrane is not bulging.  No middle ear effusion.   Nose: Mucosal edema present. No rhinorrhea. Right sinus exhibits maxillary sinus tenderness. Right sinus exhibits no frontal sinus tenderness. Left sinus exhibits maxillary sinus tenderness. Left sinus exhibits no frontal sinus tenderness.   Mouth/Throat: Uvula is midline, oropharynx is clear and moist and mucous membranes are normal. No oropharyngeal exudate, posterior oropharyngeal edema or posterior oropharyngeal erythema.        Left sinus tenderness > right   Neck: Trachea normal and normal range of motion. Neck supple.   Cardiovascular: Normal rate, regular rhythm and normal heart sounds.    No murmur  heard.  Pulmonary/Chest: Effort normal. No respiratory distress. She has decreased breath sounds (occasional in RMF). She has wheezes in the right middle field.        Productive cough   Lymphadenopathy:     She has no cervical adenopathy.   Psychiatric: She has a normal mood and affect.           Assessment:       1. Sinusitis, acute maxillary  amoxicillin-clavulanate (AUGMENTIN) 875-125 MG per tablet   2. Bronchitis, acute  amoxicillin-clavulanate (AUGMENTIN) 875-125 MG per tablet   3. Hypertension             Plan:       Start Augmentin.  Pt declines inhaler at this time.  Discussed s/sx's to watch out for that would warrant immediate re-evaluation.  FU if symptoms worsen/persist.   Continue to monitor BP.  FU with cardio/PCP if persistently  elevated.  Discussed the patient's BMI with her.   The BMI is above average; BMI management plan is completed.    HIgh BMI Follow Up   BMI Follow Up Care Plan Documented   Encouragement to Exercise  Nutrition and Physical Activity Counseling:  Nutrition Counseling:   Food education, guidance and counseling  Physical Activity Counseling   Patient advised about exercise  Pt to make appt for screening colonoscopy.     Enrique Weiss T. Matilde Haymaker, MD

## 2013-03-22 NOTE — Progress Notes (Signed)
Has the patient sought any care outside of the [facility]? Gyn   Refer to care team . no

## 2013-04-30 ENCOUNTER — Ambulatory Visit: Payer: Managed Care, Other (non HMO) | Admitting: Internal Medicine

## 2013-05-03 ENCOUNTER — Other Ambulatory Visit: Payer: Self-pay | Admitting: Advanced Practice Midwife

## 2013-05-07 ENCOUNTER — Ambulatory Visit: Payer: Exclusive Provider Organization | Attending: Advanced Practice Midwife

## 2013-05-07 DIAGNOSIS — Z1231 Encounter for screening mammogram for malignant neoplasm of breast: Secondary | ICD-10-CM | POA: Insufficient documentation

## 2013-05-11 ENCOUNTER — Ambulatory Visit (INDEPENDENT_AMBULATORY_CARE_PROVIDER_SITE_OTHER): Payer: Exclusive Provider Organization

## 2013-05-11 DIAGNOSIS — R799 Abnormal finding of blood chemistry, unspecified: Secondary | ICD-10-CM

## 2013-05-12 ENCOUNTER — Encounter (INDEPENDENT_AMBULATORY_CARE_PROVIDER_SITE_OTHER): Payer: Self-pay | Admitting: Family Medicine

## 2013-05-12 LAB — BASIC METABOLIC PANEL
BUN: 19 MG/DL (ref 7–25)
CO2: 25 mmol/L (ref 19–30)
Calcium: 9.4 MG/DL (ref 8.6–10.4)
Chloride: 105 mmol/L (ref 98–110)
Creatinine: 0.99 mg/dL (ref 0.50–1.05)
EGFR African American: 73 mL/min/{1.73_m2} (ref >=60)
EGFR: 63 mL/min/{1.73_m2} (ref >=60)
Glucose: 118 MG/DL — ABNORMAL HIGH (ref 65–99)
Potassium: 4.7 mmol/L (ref 3.5–5.3)
Sodium: 139 mmol/L (ref 135–146)

## 2013-05-14 ENCOUNTER — Ambulatory Visit (INDEPENDENT_AMBULATORY_CARE_PROVIDER_SITE_OTHER): Payer: Managed Care, Other (non HMO) | Admitting: Physician Assistant

## 2013-05-14 ENCOUNTER — Encounter: Payer: Self-pay | Admitting: Physician Assistant

## 2013-05-14 VITALS — BP 102/72 | HR 77 | Temp 98.2°F | Resp 14 | Ht 64.0 in | Wt 126.5 lb

## 2013-05-14 DIAGNOSIS — S50862A Insect bite (nonvenomous) of left forearm, initial encounter: Secondary | ICD-10-CM

## 2013-05-14 DIAGNOSIS — IMO0001 Reserved for inherently not codable concepts without codable children: Secondary | ICD-10-CM

## 2013-05-14 DIAGNOSIS — W57XXXA Bitten or stung by nonvenomous insect and other nonvenomous arthropods, initial encounter: Secondary | ICD-10-CM

## 2013-05-14 DIAGNOSIS — S50869A Insect bite (nonvenomous) of unspecified forearm, initial encounter: Secondary | ICD-10-CM | POA: Insufficient documentation

## 2013-05-14 NOTE — Progress Notes (Signed)
Patient ID: Leslie Boyer, female   DOB: [birth year], 58 y.o.   MRN: [id number]  Patient presents to clinic today c/o tick bite on her left arm.  States she noticed a tick on her left arm, near her elbow today.  She removed the tick from her skin.  Denies fever, chills, rash.  Denies redness or warmth at site of bite.  Wants to have her skin looked at.     Past Medical History  Diagnosis Date  . Hyperlipidemia   . History of tobacco abuse   . 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 Osteopenia     Current Outpatient Prescriptions on File Prior to Visit  Medication Sig Dispense Refill  . atorvastatin (LIPITOR) 40 MG tablet TAKE 1 TABLET (40 MG TOTAL) BY MOUTH DAILY.  30 tablet  3  . Multiple Vitamins-Minerals (HAIR VITAMINS PO) Take 1 tablet by mouth daily.       No current facility-administered medications on file prior to visit.    Allergies  Allergen Reactions  . Sulfonamide Derivatives     REACTION: Hives  . Penicillins Rash    Family History  Problem Relation Age of Onset  . Ovarian cancer Mother     2008  . Osteoporosis Mother     History   Social History  . Marital Status: Divorced    Spouse Name: N/A    Number of Children: N/A  . Years of Education: N/A   Social History Main Topics  . Smoking status: Former Games developer  . Smokeless tobacco: Never Used  . Alcohol Use: No  . Drug Use: No  . Sexual Activity: Yes    Birth Control/ Protection: Surgical   Other Topics Concern  . None   Social History Narrative   Former Smoker   Alcohol use-no     Occupation:  [employer]     3 daughters   1 son   Divorced  (husband had drug problem)    ROS See HPI.  All other ROS are negative.   Filed Vitals:   05/14/13 1315  BP: 102/72  Pulse: 77  Temp: 98.2 F (36.8 C)  Resp: 14    Physical Exam  Vitals reviewed. Constitutional: She is well-developed, well-nourished, and in no distress.  HENT:  Head: Normocephalic and atraumatic.  Eyes:  Conjunctivae are normal.  Neck: Neck supple.  Cardiovascular: Normal rate and regular rhythm.   Lymphadenopathy:    She has no cervical adenopathy.  Skin: Skin is warm and dry. No rash noted.  No area of erythema, swelling or tenderness noted on exam.  Patient cannot locate the exact place on arm where the tick was found.   Assessment/Plan: Tick bite of forearm No evidence of bite, erythema, tenderness or swelling on exam.  Constitutional ROS are negative.  No rash noted.  Instructed patient to monitor for redness.  Can apply ice for any tenderness or swelling that develops.  Patient educated on signs of secondary infection and RMSF.  Call or return to clinic if symptoms develop.  No need for antibiotic at present time.

## 2013-05-14 NOTE — Assessment & Plan Note (Signed)
No evidence of bite, erythema, tenderness or swelling on exam.  Constitutional ROS are negative.  No rash noted.  Instructed patient to monitor for redness.  Can apply ice for any tenderness or swelling that develops.  Patient educated on signs of secondary infection and RMSF.  Call or return to clinic if symptoms develop.  No need for antibiotic at present time.

## 2013-05-14 NOTE — Patient Instructions (Signed)
Please monitor the spot of concern.  Apply ice for tenderness and take a claritin or zyrtec if itching develops.  Please call or return to clinic if you develop fever, chills or rash.

## 2013-05-24 ENCOUNTER — Ambulatory Visit (INDEPENDENT_AMBULATORY_CARE_PROVIDER_SITE_OTHER): Payer: Managed Care, Other (non HMO) | Admitting: Physician Assistant

## 2013-05-24 ENCOUNTER — Encounter: Payer: Self-pay | Admitting: Physician Assistant

## 2013-05-24 VITALS — BP 118/72 | HR 67 | Temp 97.5°F | Ht 64.0 in | Wt 126.5 lb

## 2013-05-24 DIAGNOSIS — S93409A Sprain of unspecified ligament of unspecified ankle, initial encounter: Secondary | ICD-10-CM | POA: Insufficient documentation

## 2013-05-24 NOTE — Patient Instructions (Signed)
Please continue ibuprofen as needed for pain.  Wear compression stockings.  Elevate ankles.  If you develop leg swelling or calf pain, please return to clinic.

## 2013-05-24 NOTE — Progress Notes (Signed)
Patient ID: Leslie Boyer, female   DOB: Dec 11, 1955, 57 y.o.   MRN: [id number]  Patient presents to clinic today c/o right ankle swelling after falling over 1 week ago.  Patient endorses pain and swelling immediately after the fall.  Denies bruising.  Was able to ambulate after fall.  Denies history of ankle fracture or sprain. Has ambulated successfully on RLE without any pain since the incident.  Has no pain currently.  Is just concerned because she still has occasional swelling.  Denies cal pain, calf swelling.  Denies numbness of extremity.  Denies pallor or coolness of extremity.   Past Medical History  Diagnosis Date  . Hyperlipidemia   . History of tobacco abuse   . 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 Osteopenia     Current Outpatient Prescriptions on File Prior to Visit  Medication Sig Dispense Refill  . atorvastatin (LIPITOR) 40 MG tablet TAKE 1 TABLET (40 MG TOTAL) BY MOUTH DAILY.  30 tablet  3  . Multiple Vitamins-Minerals (HAIR VITAMINS PO) Take 1 tablet by mouth daily.       No current facility-administered medications on file prior to visit.    Allergies  Allergen Reactions  . Sulfonamide Derivatives     REACTION: Hives  . Penicillins Rash    Family History  Problem Relation Age of Onset  . Ovarian cancer Mother     2008  . Osteoporosis Mother     History   Social History  . Marital Status: Divorced    Spouse Name: N/A    Number of Children: N/A  . Years of Education: N/A   Social History Main Topics  . Smoking status: Former Games developer  . Smokeless tobacco: Never Used  . Alcohol Use: No  . Drug Use: No  . Sexual Activity: Yes    Birth Control/ Protection: Surgical   Other Topics Concern  . None   Social History Narrative   Former Smoker   Alcohol use-no     Occupation:  [employer]     3 daughters   1 son   Divorced  (husband had drug problem)    Review of Systems - See HPI.  All other ROS are negative.  Filed Vitals:   05/24/13 1139  BP: 118/72  Pulse: 67  Temp: 97.5 F (36.4 C)   Physical Exam  Vitals reviewed. Constitutional: She is oriented to person, place, and time and well-developed, well-nourished, and in no distress.  HENT:  Head: Normocephalic and atraumatic.  Eyes: Conjunctivae are normal.  Cardiovascular: Normal rate, regular rhythm and normal heart sounds.   Musculoskeletal: Normal range of motion. She exhibits no edema.       Right ankle: She exhibits swelling. She exhibits normal range of motion, no ecchymosis, no deformity, no laceration and normal pulse. Tenderness. AITFL tenderness found. No lateral malleolus, no medial malleolus, no CF ligament, no posterior TFL, no head of 5th metatarsal and no proximal fibula tenderness found.       Left ankle: Normal.       Right lower leg: Normal.       Left lower leg: Normal.       Right foot: Normal.       Left foot: Normal.  Neurological: She is alert and oriented to person, place, and time. She has normal sensation and normal strength. Gait normal.  Skin: Skin is warm and dry. No rash noted.  Psychiatric: Affect normal.    Assessment/Plan:  Sprain of ankle, unspecified site Discussed option of imaging.  Patient declines.  Given ability to ambulate after trauma and since, significant fracture unlikely.  Rx Ibuprofen.  RICE therapy.  Avoid overuse.  Return if symptoms not improving.

## 2013-05-24 NOTE — Progress Notes (Signed)
Pre visit review using our clinic review tool, if applicable. No additional management support is needed unless otherwise documented below in the visit note. 

## 2013-05-24 NOTE — Assessment & Plan Note (Signed)
Discussed option of imaging.  Patient declines.  Given ability to ambulate after trauma and since, significant fracture unlikely.  Rx Ibuprofen.  RICE therapy.  Avoid overuse.  Return if symptoms not improving.

## 2013-06-21 ENCOUNTER — Encounter (INDEPENDENT_AMBULATORY_CARE_PROVIDER_SITE_OTHER): Payer: Self-pay | Admitting: Family Medicine

## 2013-06-28 DIAGNOSIS — Z9289 Personal history of other medical treatment: Secondary | ICD-10-CM

## 2013-06-28 HISTORY — DX: Personal history of other medical treatment: Z92.89

## 2013-07-05 ENCOUNTER — Encounter (INDEPENDENT_AMBULATORY_CARE_PROVIDER_SITE_OTHER): Payer: Self-pay | Admitting: Family Medicine

## 2013-07-10 ENCOUNTER — Other Ambulatory Visit: Payer: Self-pay | Admitting: Internal Medicine

## 2013-08-06 ENCOUNTER — Ambulatory Visit (INDEPENDENT_AMBULATORY_CARE_PROVIDER_SITE_OTHER): Payer: Commercial Managed Care - POS | Admitting: Family Medicine

## 2013-08-06 ENCOUNTER — Encounter (INDEPENDENT_AMBULATORY_CARE_PROVIDER_SITE_OTHER): Payer: Self-pay | Admitting: Family Medicine

## 2013-08-06 VITALS — BP 116/72 | HR 87 | Temp 99.7°F | Wt 225.5 lb

## 2013-08-06 DIAGNOSIS — J0100 Acute maxillary sinusitis, unspecified: Secondary | ICD-10-CM

## 2013-08-06 DIAGNOSIS — J209 Acute bronchitis, unspecified: Secondary | ICD-10-CM

## 2013-08-06 MED ORDER — AMOXICILLIN-POT CLAVULANATE 875-125 MG PO TABS
1.0000 | ORAL_TABLET | Freq: Two times a day (BID) | ORAL | Status: AC
Start: 2013-08-06 — End: 2013-08-16

## 2013-08-06 NOTE — Progress Notes (Signed)
Subjective:       Patient ID: [name] [name] is a 58 y.o. female.    Chief Complaint   Patient presents with   . URI     3 days, cough sore throat, congestion, fatigue, wheezing at night, brown discharge from chest         URI   This is a new problem. The current episode started in the past 7 days. The problem has been waxing and waning. Associated symptoms include congestion, coughing, headaches, rhinorrhea, sinus pain, a sore throat, vomiting (post-tussive) and wheezing (at night). Pertinent negatives include no abdominal pain, chest pain, diarrhea, ear pain, joint pain, nausea, neck pain or plugged ear sensation. Associated symptoms comments: fatigue. She has tried increased fluids and sleep for the symptoms. The treatment provided mild relief.       The following portions of the patient's history were reviewed and updated as appropriate: allergies, current medications, past medical history, past social history, past surgical history and problem list.    Review of Systems   Constitutional: Positive for chills, activity change, appetite change and fatigue. Negative for fever.   HENT: Positive for congestion, postnasal drip, rhinorrhea, sinus pressure and sore throat. Negative for ear pain.    Respiratory: Positive for cough, shortness of breath and wheezing (at night).    Cardiovascular: Negative for chest pain and palpitations.   Gastrointestinal: Positive for vomiting (post-tussive). Negative for nausea, abdominal pain and diarrhea.   Musculoskeletal: Negative for joint pain and neck pain.   Neurological: Positive for headaches.   Psychiatric/Behavioral: Positive for sleep disturbance.     Filed Vitals:    08/06/13 1332   BP: 116/72   Pulse: 87   Temp: 99.7 F (37.6 C)   TempSrc: Oral   Weight: 102.286 kg (225 lb 8 oz)   SpO2: 98%           Objective:    Physical Exam   Vitals reviewed.  Constitutional: She appears well-developed and well-nourished. She does not have a sickly appearance. She appears ill.    HENT:   Right Ear: External ear and ear canal normal. Tympanic membrane is not bulging. No middle ear effusion.   Left Ear: Tympanic membrane, external ear and ear canal normal. Tympanic membrane is not bulging.  No middle ear effusion.   Nose: Mucosal edema and rhinorrhea present. Right sinus exhibits maxillary sinus tenderness. Right sinus exhibits no frontal sinus tenderness. Left sinus exhibits maxillary sinus tenderness and frontal sinus tenderness.   Mouth/Throat: Uvula is midline, oropharynx is clear and moist and mucous membranes are normal. No oropharyngeal exudate, posterior oropharyngeal edema or posterior oropharyngeal erythema.   Neck: Trachea normal and normal range of motion. Neck supple.   Cardiovascular: Normal rate, regular rhythm and normal heart sounds.    No murmur heard.  Pulmonary/Chest: Effort normal and breath sounds normal. No respiratory distress.   Lymphadenopathy:        Head (right side): Submandibular adenopathy present. No submental, no preauricular and no posterior auricular adenopathy present.        Head (left side): Submandibular adenopathy present. No submental, no preauricular and no posterior auricular adenopathy present.     She has no cervical adenopathy.   Neurological: She is alert.   Psychiatric: She has a normal mood and affect.           Assessment:       1. Sinusitis, acute, maxillary  amoxicillin-clavulanate (AUGMENTIN) 875-125 MG per tablet   2. Bronchitis,  acute  amoxicillin-clavulanate (AUGMENTIN) 875-125 MG per tablet           Plan:       Start abx above.  GI referral order reprinted.  Reminded pt of need for screening colonoscopy.  Return when well for flu vaccine.  Discussed s/sx's to watch out for that would warrant immediate re-evaluation.  FU if symptoms worsen/persist.     Daphine Loch T. Matilde Haymaker, MD

## 2013-08-06 NOTE — Progress Notes (Signed)
Since your last visit, have you seen any new specialist or physicians?    no

## 2013-08-10 ENCOUNTER — Encounter (INDEPENDENT_AMBULATORY_CARE_PROVIDER_SITE_OTHER): Payer: Self-pay | Admitting: Family Medicine

## 2013-09-29 ENCOUNTER — Encounter (INDEPENDENT_AMBULATORY_CARE_PROVIDER_SITE_OTHER): Payer: Self-pay | Admitting: Cardiovascular Disease

## 2013-10-18 ENCOUNTER — Encounter (INDEPENDENT_AMBULATORY_CARE_PROVIDER_SITE_OTHER): Payer: Self-pay

## 2013-11-29 ENCOUNTER — Encounter (INDEPENDENT_AMBULATORY_CARE_PROVIDER_SITE_OTHER): Payer: Self-pay | Admitting: Family Medicine

## 2014-01-19 ENCOUNTER — Other Ambulatory Visit: Payer: Self-pay | Admitting: Internal Medicine

## 2014-01-23 ENCOUNTER — Other Ambulatory Visit: Payer: Self-pay | Admitting: Internal Medicine

## 2014-01-24 ENCOUNTER — Telehealth: Payer: Self-pay | Admitting: Internal Medicine

## 2014-01-24 MED ORDER — ATORVASTATIN CALCIUM 40 MG PO TABS: ORAL_TABLET | ORAL | Status: DC

## 2014-01-24 NOTE — Telephone Encounter (Signed)
Pt is needing new rx for atorvastatin (LIPITOR) 40 MG tablet Send to [facility]- [address], pt states she only has 1 pill left and pt scheduled appt for fu on meds

## 2014-01-24 NOTE — Telephone Encounter (Signed)
rx sent in electronically 

## 2014-02-07 ENCOUNTER — Encounter: Payer: Self-pay | Admitting: Internal Medicine

## 2014-02-07 ENCOUNTER — Ambulatory Visit (INDEPENDENT_AMBULATORY_CARE_PROVIDER_SITE_OTHER): Payer: Managed Care, Other (non HMO) | Admitting: Internal Medicine

## 2014-02-07 VITALS — BP 104/70 | HR 64 | Temp 98.4°F | Ht 64.0 in | Wt 134.0 lb

## 2014-02-07 DIAGNOSIS — I8390 Asymptomatic varicose veins of unspecified lower extremity: Secondary | ICD-10-CM

## 2014-02-07 DIAGNOSIS — E785 Hyperlipidemia, unspecified: Secondary | ICD-10-CM

## 2014-02-07 MED ORDER — ATORVASTATIN CALCIUM 40 MG PO TABS: ORAL_TABLET | ORAL | Status: DC

## 2014-02-07 NOTE — Progress Notes (Signed)
   Subjective:    Patient ID: Leslie Boyer, female    DOB: 23-Jul-1955, 58 y.o.   MRN: [id number]  HPI  58 year old [place] American female with history of hyperlipidemia for routine followup. Patient reports sporadic compliance with her atorvastatin. She has had lipid checks with her employer.  LDL reported elevated.  She has gained weight since previous visit. Her weight is stable. Her previous weight loss related to dental issues.  Review of Systems Negative for chest pain or shortness of breath She complains of "unsightly" varicose veins in her lower extremities     Past Medical History  Diagnosis Date  . Hyperlipidemia   . History of tobacco abuse   . 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 Osteopenia     History   Social History  . Marital Status: Divorced    Spouse Name: N/A    Number of Children: N/A  . Years of Education: N/A   Occupational History  . Not on file.   Social History Main Topics  . Smoking status: Former Research scientist (life sciences)  . Smokeless tobacco: Never Used  . Alcohol Use: No  . Drug Use: No  . Sexual Activity: Yes    Birth Control/ Protection: Surgical   Other Topics Concern  . Not on file   Social History Narrative   Former Smoker   Alcohol use-no     Occupation:  [employer]     3 daughters   1 son   Divorced  (husband had drug problem)     Past Surgical History  Procedure Laterality Date  . Total abdominal hysterectomy    . Multiple tooth extractions  11/2011    Family History  Problem Relation Age of Onset  . Ovarian cancer Mother     2008  . Osteoporosis Mother     Allergies  Allergen Reactions  . Sulfonamide Derivatives     REACTION: Hives  . Penicillins Rash    No current outpatient prescriptions on file prior to visit.   No current facility-administered medications on file prior to visit.    BP 104/70  Pulse 64  Temp(Src) 98.4 F (36.9 C) (Oral)  Ht 5\' 4"  (1.626 m)  Wt 134 lb (60.782 kg)  BMI 22.99  kg/m2    Objective:   Physical Exam  Constitutional: She is oriented to person, place, and time. She appears well-developed and well-nourished.  HENT:  Head: Normocephalic and atraumatic.  Cardiovascular: Normal rate, regular rhythm and normal heart sounds.   Bilateral lower extremity varicose veins  Pulmonary/Chest: Effort normal and breath sounds normal. She has no wheezes.  Musculoskeletal: She exhibits no edema.  Neurological: She is alert and oriented to person, place, and time.  Skin: Skin is warm and dry.  Psychiatric: She has a normal mood and affect. Her behavior is normal.          Assessment & Plan:

## 2014-02-07 NOTE — Assessment & Plan Note (Signed)
Lower ext varicose veins are fairly asymptomatic. We diiscussed various treatment options.

## 2014-02-07 NOTE — Patient Instructions (Signed)
Please complete the following lab tests within 1 month: BMET,  FLP, LFTs, TSH, CBC - 272.4, varicella titer - v70 Ask your insurance co re: coverage for zostavax

## 2014-02-07 NOTE — Progress Notes (Signed)
Pre visit review using our clinic review tool, if applicable. No additional management support is needed unless otherwise documented below in the visit note. 

## 2014-02-07 NOTE — Assessment & Plan Note (Signed)
Medication compliance encouraged. Restart atorvastatin 40 mg once daily. Arrange followup labs.

## 2014-02-21 ENCOUNTER — Encounter: Payer: Self-pay | Admitting: Gastroenterology

## 2014-04-05 LAB — HM MAMMOGRAPHY: HM Mammogram: NORMAL

## 2014-04-14 HISTORY — PX: MYOMECTOMY: SHX85

## 2014-04-22 ENCOUNTER — Encounter (FREE_STANDING_LABORATORY_FACILITY): Payer: Commercial Managed Care - POS

## 2014-04-22 ENCOUNTER — Ambulatory Visit: Payer: Self-pay

## 2014-04-22 DIAGNOSIS — N841 Polyp of cervix uteri: Secondary | ICD-10-CM

## 2014-04-26 LAB — LAB USE ONLY - HISTORICAL SURGICAL PATHOLOGY

## 2014-06-22 ENCOUNTER — Other Ambulatory Visit (INDEPENDENT_AMBULATORY_CARE_PROVIDER_SITE_OTHER): Payer: Self-pay | Admitting: Cardiovascular Disease

## 2014-06-22 DIAGNOSIS — I10 Essential (primary) hypertension: Secondary | ICD-10-CM

## 2014-06-22 NOTE — Telephone Encounter (Signed)
Medication Refill Request    Requested Medication: Valsartan HCTZ 320-12.5 1 tab QD #90     Last Office Visit: 07/29/2013      Follow-Up Appointment due: 07/07/2014    Medications, Pharmacy on file, & Allergies have been reconciled, please review and approve the ordered medication(s).     Thank you,  Doree Barthel, LPN

## 2014-06-22 NOTE — Addendum Note (Signed)
Addended by: Gerald Leitz on: 06/22/2014 04:11 PM     Modules accepted: Orders

## 2014-06-22 NOTE — Telephone Encounter (Signed)
Please call in Valsartan-Hydrochlorothiazide 320-12.5mg  to the [facility] in [place]. Patient is completely out and has an appt scheduled with Dr. Sherryll Burger on 12/24 in [place].

## 2014-06-23 MED ORDER — VALSARTAN-HYDROCHLOROTHIAZIDE 320-12.5 MG PO TABS
1.0000 | ORAL_TABLET | Freq: Every day | ORAL | Status: DC
Start: 2014-06-23 — End: 2014-08-03

## 2014-06-23 NOTE — Telephone Encounter (Signed)
#  15 only, called to pharmacy ([facility] [place]). Advised must keep f/u visit for further refills> tlcooke,lpn

## 2014-07-07 ENCOUNTER — Ambulatory Visit (INDEPENDENT_AMBULATORY_CARE_PROVIDER_SITE_OTHER): Payer: Commercial Managed Care - POS | Admitting: Cardiovascular Disease

## 2014-08-03 ENCOUNTER — Other Ambulatory Visit (INDEPENDENT_AMBULATORY_CARE_PROVIDER_SITE_OTHER): Payer: Self-pay | Admitting: Cardiovascular Disease

## 2014-08-03 DIAGNOSIS — I10 Essential (primary) hypertension: Secondary | ICD-10-CM

## 2014-08-03 MED ORDER — VALSARTAN-HYDROCHLOROTHIAZIDE 320-12.5 MG PO TABS
1.0000 | ORAL_TABLET | Freq: Every day | ORAL | Status: DC
Start: 2014-08-03 — End: 2014-08-15

## 2014-08-03 NOTE — Telephone Encounter (Signed)
Pt needs another 30 day refill on:  Valsartan HCTZ 320-12.5, 1 tablet QD    Sent to:   [facility] in [place]    She had an appt 07/07/14 that we cancelled on her because of early close that day.    She now has an appt this Friday with [facility] but has run out of her meds.    Please fill. Thank you

## 2014-08-03 NOTE — Telephone Encounter (Signed)
Medication Refill Request    Requested Medication: Valsartan HCTZ 320-12.5 mg 1 tab QD #30    Last Office Visit: 2015      Follow-Up Appointment due: 08/04/2014    Medications, Pharmacy on file, & Allergies have been reconciled, please review and approve the ordered medication(s).     Thank you,    Doree Barthel, LPN

## 2014-08-05 ENCOUNTER — Ambulatory Visit (INDEPENDENT_AMBULATORY_CARE_PROVIDER_SITE_OTHER): Payer: Commercial Managed Care - POS | Admitting: Cardiovascular Disease

## 2014-08-15 ENCOUNTER — Ambulatory Visit (INDEPENDENT_AMBULATORY_CARE_PROVIDER_SITE_OTHER): Payer: Commercial Managed Care - POS | Admitting: Cardiovascular Disease

## 2014-08-15 ENCOUNTER — Encounter (INDEPENDENT_AMBULATORY_CARE_PROVIDER_SITE_OTHER): Payer: Self-pay | Admitting: Cardiovascular Disease

## 2014-08-15 DIAGNOSIS — I10 Essential (primary) hypertension: Secondary | ICD-10-CM

## 2014-08-15 DIAGNOSIS — I2510 Atherosclerotic heart disease of native coronary artery without angina pectoris: Secondary | ICD-10-CM | POA: Insufficient documentation

## 2014-08-15 MED ORDER — VALSARTAN-HYDROCHLOROTHIAZIDE 320-12.5 MG PO TABS
1.0000 | ORAL_TABLET | Freq: Every day | ORAL | Status: DC
Start: 2014-08-15 — End: 2015-10-04

## 2014-08-15 MED ORDER — CARVEDILOL 25 MG PO TABS
50.0000 mg | ORAL_TABLET | Freq: Two times a day (BID) | ORAL | Status: DC
Start: 2014-08-15 — End: 2015-09-20

## 2014-08-15 MED ORDER — SPIRONOLACTONE 50 MG PO TABS
50.0000 mg | ORAL_TABLET | Freq: Every day | ORAL | Status: DC
Start: 2014-08-15 — End: 2015-10-04

## 2014-08-15 NOTE — Progress Notes (Signed)
[facility]  [address]  [place], [place] [address]  Dept: [phone]  Dept Fax: [phone]      Chief Complaint   Patient presents with   . Hypertension     Here for 6 month follow up. Blood pressure has been stable.   . Coronary Artery Disease     She is asymptomatic.          History of Present Illness       [name] [name] is a 59 y.o. female who presents for one year follow up visit and for follow up for hypertension.  Patient has history of no previous cardiac procedures.    Patient has no cardiac symptoms.  Denies any chest pains, SOB and palpitations..     Patient exercises regularly 2-3 times per week.    Past Medical History     Past Medical History   Diagnosis Date   . Left ventricular hypertrophy 2010     s/p echo   . Coronary artery disease, non-occlusive 2010     nonobstructive s/p cath 2010   . Hypertensive disorder 2004   . Pap smear for cervical cancer screening 07/25/2101     no hx of abnormal pap smear   . H/O mammogram 03/15/2102      normal    . Fibroid, uterine 05/2012   . Ovarian cyst, right 05/2012   . H/O echocardiogram 06/28/13     EF 65%, mild LVH   . Coronary artery disease        Past Surgical History     Past Surgical History   Procedure Laterality Date   . Cardiac catheterization  2010     non-obs CAD   . Tubal ligation  1984   . Inguinal hernia repair  2004     right       Family History     Family History   Problem Relation Age of Onset   . Breast cancer Mother 30     breast ca    . Bone cancer Mother    . Heart failure Sister    . Heart failure Brother        Social History     History     Social History   . Marital Status: Married     Spouse Name: N/A     Number of Children: N/A   . Years of Education: N/A     Occupational History   . Not on file.     Social History Main Topics   . Smoking status: Never Smoker    . Smokeless tobacco: Never Used   . Alcohol Use: 0.0 oz/week      Comment: socially   . Drug Use: No   . Sexual Activity:      Partners: Male     Birth Control/ Protection: Surgical      Comment: s/p BTL     Other Topics Concern   . Not on file     Social History Narrative       Allergies     Allergies   Allergen Reactions   . Aspirin Shortness Of Breath and Rash   . Levaquin [Levofloxacin Hemihydrate]      Joint pains   . Tetanus Immune Globulin      Skin reaction       Medications     Current Outpatient Prescriptions   Medication Sig Dispense Refill   . carvedilol (COREG) 25 MG tablet  Take 2 tablets (50 mg total) by mouth 2 (two) times daily with meals. 120 tablet 11   . SOY ISOFLAVONE PO Take 250 mg by mouth daily.     Marland Kitchen spironolactone (ALDACTONE) 50 MG tablet Take 1 tablet (50 mg total) by mouth daily. 30 tablet 11   . valsartan-hydrochlorothiazide (DIOVAN-HCT) 320-12.5 MG per tablet Take 1 tablet by mouth daily. 30 tablet 11     No current facility-administered medications for this visit.         Review of Systems     Constitutional: Negative for fevers and chills  Respiratory: Negative for cough, wheezing, or hemoptysis  Cardiovascular: as per HPI  Gastrointestinal: Negative for abdominal pain, nausea, vomiting and diarrhea      Physical Exam     Filed Vitals:    08/15/14 0939   BP: 142/88   Pulse: 68      Wt Readings from Last 1 Encounters:   08/15/14 99.791 kg (220 lb)     Body mass index is 37.74 kg/(m^2).    Neck: normal carotid upstroke, no JVD, no carotid bruit  Respiratory: Clear to auscultation and percussion throughout. Respiratory effort unlabored, chest expansion symmetric.    Cardio: Regular rate and rhythm, Normal S1/S2, no murmur, no gallop, no rub  GI: Soft, nondistended, nontender.  No guarding or rebound.  Extremities: warm, pulses 2+, no peripheral edema        Labs and diagnostic studies       CBC:   WBC   Date/Time Value Ref Range Status   10/31/2008 10:58 AM 4.06 3.50 - 10.80 /CUMM Final     HGB   Date/Time Value Ref Range Status   10/31/2008 10:58 AM 9.9* 12.0 - 16.0 G/DL Final     HEMATOCRIT   Date/Time Value  Ref Range Status   10/31/2008 10:58 AM 31.9* 37.0 - 47.0 % Final     PLATELETS   Date/Time Value Ref Range Status   10/31/2008 10:58 AM 184 140 - 400 /CUMM Final     CMP:   SODIUM   Date/Time Value Ref Range Status   05/11/2013 09:22 AM 139 135 - 146 mmol/L Final     POTASSIUM   Date/Time Value Ref Range Status   05/11/2013 09:22 AM 4.7 3.5 - 5.3 mmol/L Final     CHLORIDE   Date/Time Value Ref Range Status   05/11/2013 09:22 AM 105 98 - 110 mmol/L Final   10/31/2008 10:58 AM 106 98 - 107 MEQ/L Final     CO2   Date/Time Value Ref Range Status   05/11/2013 09:22 AM 25 19 - 30 mmol/L Final     BUN   Date/Time Value Ref Range Status   05/11/2013 09:22 AM 19 7 - 25 MG/DL Final     CREATININE   Date/Time Value Ref Range Status   05/11/2013 09:22 AM 0.99 0.50 - 1.05 mg/dL Final   91/47/8295 62:13 AM 0.9 0.5 - 1.4 MG/DL Final     GLUCOSE   Date/Time Value Ref Range Status   05/11/2013 09:22 AM 118* 65 - 99 MG/DL Final     Lipid Panel:  No results found for: CHOL, LDL, HDL, TRIG    Echocardiogram:  2012 = left ventricular hypertrophy    Nuclear stress test:  2010 = false positive nuclear study with lateral ischemia.    Cardiac catheterization:  11/02/08 =LAD-mid 40 percent, D1-50 percent.      EKG      The EKG is  significant for SR, NML.    Assessment and Plan     1. Coronary artery disease involving native coronary artery of native heart without angina pectoris  Patient has history of mild coronary artery disease based on catheterization in 2010.  She continues to well with no chest pains or shortness of breath.    - ECG 12 lead (Today)    2. Essential hypertension  MRA of the renal arteries 2004 = no renal artery stenosis    She appears be stable.  I recommend continue same medications.    - carvedilol (COREG) 25 MG tablet; Take 2 tablets (50 mg total) by mouth 2 (two) times daily with meals.  Dispense: 120 tablet; Refill: 11  - spironolactone (ALDACTONE) 50 MG tablet; Take 1 tablet (50 mg total) by mouth daily.  Dispense:  30 tablet; Refill: 11  - valsartan-hydrochlorothiazide (DIOVAN-HCT) 320-12.5 MG per tablet; Take 1 tablet by mouth daily.  Dispense: 30 tablet; Refill: 11    3. Morbid obesity due to excess calories  Recommend weight loss  She has lost about 7 pounds since her last visit.      Clearnce Sorrel, MD

## 2014-09-28 ENCOUNTER — Telehealth (INDEPENDENT_AMBULATORY_CARE_PROVIDER_SITE_OTHER): Payer: Self-pay | Admitting: Cardiovascular Disease

## 2014-09-28 NOTE — Telephone Encounter (Signed)
Pt called. She accidentally threw out the remainder of her Spironolactone RX. She is wondering if she can get it refilled.     If she is unable to get it refilled yet, can she take a potassium supplement pill in the meantime? Please call to advise.     Pharmacy- [facility] in [place]    CB# [phone]

## 2014-09-28 NOTE — Telephone Encounter (Signed)
Lm to call back

## 2014-09-30 NOTE — Telephone Encounter (Signed)
Unable to leave VM. Line kept ringing. Will try again later.

## 2014-11-18 ENCOUNTER — Other Ambulatory Visit: Payer: Commercial Managed Care - POS

## 2014-11-18 ENCOUNTER — Ambulatory Visit: Payer: Commercial Managed Care - POS | Attending: Obstetrics & Gynecology

## 2014-11-18 ENCOUNTER — Other Ambulatory Visit (FREE_STANDING_LABORATORY_FACILITY): Payer: Commercial Managed Care - POS

## 2014-11-18 ENCOUNTER — Other Ambulatory Visit (INDEPENDENT_AMBULATORY_CARE_PROVIDER_SITE_OTHER): Payer: Self-pay

## 2014-11-18 ENCOUNTER — Other Ambulatory Visit: Payer: Self-pay | Admitting: Obstetrics & Gynecology

## 2014-11-18 DIAGNOSIS — Z1239 Encounter for other screening for malignant neoplasm of breast: Secondary | ICD-10-CM

## 2014-11-18 DIAGNOSIS — I10 Essential (primary) hypertension: Secondary | ICD-10-CM

## 2014-11-18 DIAGNOSIS — I2510 Atherosclerotic heart disease of native coronary artery without angina pectoris: Secondary | ICD-10-CM

## 2014-11-18 DIAGNOSIS — Z1231 Encounter for screening mammogram for malignant neoplasm of breast: Secondary | ICD-10-CM | POA: Insufficient documentation

## 2014-11-18 DIAGNOSIS — Z0000 Encounter for general adult medical examination without abnormal findings: Secondary | ICD-10-CM

## 2014-11-18 LAB — COMPREHENSIVE METABOLIC PANEL
ALT: 14 U/L (ref 0–55)
AST (SGOT): 20 U/L (ref 5–34)
Albumin/Globulin Ratio: 1.3 (ref 0.9–2.2)
Albumin: 4 g/dL (ref 3.5–5.0)
Alkaline Phosphatase: 57 U/L (ref 37–106)
BUN: 22 mg/dL — ABNORMAL HIGH (ref 7.0–19.0)
Bilirubin, Total: 0.3 mg/dL (ref 0.1–1.2)
CO2: 26 mEq/L (ref 21–30)
Calcium: 9.5 mg/dL (ref 8.5–10.5)
Chloride: 108 mEq/L (ref 100–111)
Creatinine: 1.1 mg/dL (ref 0.4–1.5)
Globulin: 3.2 g/dL (ref 2.0–3.7)
Glucose: 111 mg/dL — ABNORMAL HIGH (ref 70–100)
Potassium: 4.6 mEq/L (ref 3.5–5.3)
Protein, Total: 7.2 g/dL (ref 6.0–8.3)
Sodium: 142 mEq/L (ref 135–146)

## 2014-11-18 LAB — LIPID PANEL
Cholesterol / HDL Ratio: 4.5
Cholesterol: 161 mg/dL (ref 0–199)
HDL: 36 mg/dL — ABNORMAL LOW (ref >40)
LDL Calculated: 112 mg/dL — ABNORMAL HIGH (ref 0–99)
Triglycerides: 63 mg/dL (ref 34–149)
VLDL Calculated: 13 mg/dL (ref 10–40)

## 2014-11-18 LAB — CBC
Hematocrit: 41.9 % (ref 37.0–47.0)
Hgb: 13.5 g/dL (ref 12.0–16.0)
MCH: 30 pg (ref 28.0–32.0)
MCHC: 32.2 g/dL (ref 32.0–36.0)
MCV: 93.1 fL (ref 80.0–100.0)
MPV: 13.2 fL — ABNORMAL HIGH (ref 9.4–12.3)
Nucleated RBC: 0 /100 WBC (ref 0–1)
Platelets: 132 10*3/uL — ABNORMAL LOW (ref 140–400)
RBC: 4.5 10*6/uL (ref 4.20–5.40)
RDW: 14 % (ref 12–15)
WBC: 5.35 10*3/uL (ref 3.50–10.80)

## 2014-11-18 LAB — VITAMIN D,25 OH,TOTAL: Vitamin D, 25 OH, Total: 47 ng/mL (ref 30–100)

## 2014-11-18 LAB — HEMOLYSIS INDEX: Hemolysis Index: 7 (ref 0–18)

## 2014-11-18 LAB — HEMOGLOBIN A1C: Hemoglobin A1C: 6 % (ref 0.0–6.0)

## 2014-11-18 LAB — GFR: EGFR: >60

## 2014-11-18 NOTE — Progress Notes (Signed)
I have not seen patient since 07/2013.  Not sure what labs she needs.  She may be getting labs done as ordered by a specialist? Would need OV with me if wanting labs ordered by me.

## 2014-11-18 NOTE — Progress Notes (Signed)
Labs ordered as requested.  Needs appt for PE.

## 2014-11-18 NOTE — Progress Notes (Signed)
I called pt to find out if she had blood work orders from a specialist. She said no, and explained that Dr. Matilde Haymaker had told her in the past she needed to get her BW done every 6 months. She wants to get her BW done today and she plans to schedule a physical appt when she is here today. She advised she wants to get the BW done first, that way when she comes in for her physical, she can discuss results with Dr. Matilde Haymaker.

## 2014-11-19 LAB — TSH: TSH: 1.05 u[IU]/mL (ref 0.35–4.94)

## 2014-11-21 ENCOUNTER — Encounter (INDEPENDENT_AMBULATORY_CARE_PROVIDER_SITE_OTHER): Payer: Self-pay | Admitting: Family Medicine

## 2014-11-21 ENCOUNTER — Ambulatory Visit (INDEPENDENT_AMBULATORY_CARE_PROVIDER_SITE_OTHER): Payer: Commercial Managed Care - POS | Admitting: Family Medicine

## 2014-11-21 VITALS — BP 158/84 | HR 66 | Temp 98.5°F | Resp 16 | Ht 63.19 in | Wt 219.2 lb

## 2014-11-21 DIAGNOSIS — T50A95D Adverse effect of other bacterial vaccines, subsequent encounter: Secondary | ICD-10-CM

## 2014-11-21 DIAGNOSIS — T50A95A Adverse effect of other bacterial vaccines, initial encounter: Secondary | ICD-10-CM | POA: Insufficient documentation

## 2014-11-21 DIAGNOSIS — I10 Essential (primary) hypertension: Secondary | ICD-10-CM

## 2014-11-21 DIAGNOSIS — I2510 Atherosclerotic heart disease of native coronary artery without angina pectoris: Secondary | ICD-10-CM

## 2014-11-21 DIAGNOSIS — R7301 Impaired fasting glucose: Secondary | ICD-10-CM | POA: Insufficient documentation

## 2014-11-21 DIAGNOSIS — Z0000 Encounter for general adult medical examination without abnormal findings: Secondary | ICD-10-CM

## 2014-11-21 NOTE — Progress Notes (Signed)
Subjective:       Patient ID: [name] [name] is a 59 y.o. female.    Chief Complaint   Patient presents with   . Annual Exam         HPI    Pt here for non-fasting PE.  Fasting labs done last week.   WWE in 07/2014 by OBGYN.     Problem   Adverse Reaction to Tetanus Vaccine   Impaired Fasting Glucose    Has cut down on soda, drinking only water and coffee    Lab Results   Component Value Date    HGBA1C 6.0 11/18/2014     Lab Results   Component Value Date    GLU 111* 11/18/2014    LDL 112* 11/18/2014    CREAT 1.1 11/18/2014          Coronary Artery Disease    Cath 2010 revealed non-obstructive CAD in diag and LAD  FU with cardio 08/2014     Hypertension    On carvedilol 50mg  BID, spironolactone 50mg  QD, Diovan HCT 320/12.5mg  QD  Followed by cardio, Dr. Loree Fee (q6 mos)  Tolerating well, no ADR's  Home BP 130s/80s  Tries to follow low salt diet and tries to follow regular activity   Saw cardiologist 08/2014    BP Readings from Last 3 Encounters:   11/21/14 158/84   08/15/14 142/88   08/06/13 116/72   Has not taken today yet 11/21/2014               The following portions of the patient's history were reviewed and updated as appropriate: allergies, current medications, past family history, past medical history, past social history, past surgical history and problem list.    Patient Active Problem List   Diagnosis   . Hypertension   . Coronary artery disease   . Morbid obesity due to excess calories   . Adverse reaction to tetanus vaccine     Past Medical History   Diagnosis Date   . Left ventricular hypertrophy 2010     s/p echo   . Coronary artery disease, non-occlusive 2010     nonobstructive s/p cath 2010   . Hypertensive disorder 2004   . Pap smear for cervical cancer screening 07/25/2101     no hx of abnormal pap smear   . H/O mammogram 03/15/2102      normal    . Fibroid, uterine 05/2012   . Ovarian cyst, right 05/2012   . H/O echocardiogram 06/28/13     EF 65%, mild LVH     Past Surgical History   Procedure  Laterality Date   . Cardiac catheterization  2010     non-obs CAD   . Tubal ligation  1984   . Inguinal hernia repair  2004     right   . Myomectomy  04/2014     Family History   Problem Relation Age of Onset   . Breast cancer Mother 49     breast ca    . Bone cancer Mother    . Heart failure Sister    . Heart failure Brother      History     Social History   . Marital Status: Married     Spouse Name: N/A   . Number of Children: N/A   . Years of Education: N/A     Occupational History   . Not on file.     Social History Main Topics   . Smoking  status: Never Smoker    . Smokeless tobacco: Never Used   . Alcohol Use: 0.0 oz/week      Comment: socially   . Drug Use: No   . Sexual Activity:     Partners: Male     Birth Control/ Protection: Surgical      Comment: s/p BTL     Other Topics Concern   . Not on file     Social History Narrative     Current Outpatient Prescriptions   Medication Sig Dispense Refill   . Calcium-Magnesium 500-250 MG Tab Take 1 tablet by mouth daily.     . carvedilol (COREG) 25 MG tablet Take 2 tablets (50 mg total) by mouth 2 (two) times daily with meals. 120 tablet 11   . Cholecalciferol (VITAMIN D) 2000 UNITS Cap Take 2,000 Unit by mouth daily.     . SOY ISOFLAVONE PO Take 250 mg by mouth daily.     Marland Kitchen spironolactone (ALDACTONE) 50 MG tablet Take 1 tablet (50 mg total) by mouth daily. 30 tablet 11   . valsartan-hydrochlorothiazide (DIOVAN-HCT) 320-12.5 MG per tablet Take 1 tablet by mouth daily. 30 tablet 11     No current facility-administered medications for this visit.     Allergies   Allergen Reactions   . Aspirin Shortness Of Breath and Rash   . Levaquin [Levofloxacin Hemihydrate]      Joint pains   . Tetanus Immune Globulin      Skin reaction         Review of Systems   Constitutional: Negative for fever, chills, activity change, appetite change, fatigue and unexpected weight change.   HENT: Negative for congestion, postnasal drip, rhinorrhea, sinus pressure, sneezing and sore throat.     Eyes: Negative for discharge, itching and visual disturbance.   Respiratory: Negative for cough and shortness of breath.    Cardiovascular: Negative for chest pain and palpitations.   Gastrointestinal: Negative for nausea, vomiting, abdominal pain, diarrhea, constipation and blood in stool.   Genitourinary: Negative for dysuria, urgency, frequency, vaginal bleeding, vaginal discharge, genital sores, vaginal pain, menstrual problem and pelvic pain.   Musculoskeletal: Negative for back pain, joint swelling and arthralgias.   Skin: Negative for rash.   Neurological: Negative for dizziness, light-headedness, numbness and headaches.   Hematological: Negative for adenopathy.   Psychiatric/Behavioral: Negative for sleep disturbance, dysphoric mood and decreased concentration. The patient is not nervous/anxious.          Filed Vitals:    11/21/14 1408 11/21/14 1412   BP: 165/87 158/84   Pulse: 66    Temp: 98.5 F (36.9 C)    Resp: 16    Height: 1.605 m (5' 3.19")    Weight: 99.428 kg (219 lb 3.2 oz)    SpO2: 97%            Objective:    Physical Exam   Constitutional: Vital signs are normal. She appears well-developed and well-nourished. She does not appear ill.   HENT:   Right Ear: Tympanic membrane, external ear and ear canal normal. Tympanic membrane is not erythematous.   Left Ear: Tympanic membrane, external ear and ear canal normal. Tympanic membrane is not erythematous.   Nose: Nose normal. No mucosal edema or rhinorrhea. Right sinus exhibits no maxillary sinus tenderness and no frontal sinus tenderness. Left sinus exhibits no maxillary sinus tenderness and no frontal sinus tenderness.   Mouth/Throat: Uvula is midline, oropharynx is clear and moist and mucous membranes are normal. No  oral lesions.   Eyes: Conjunctivae, EOM and lids are normal. Pupils are equal, round, and reactive to light.   Neck: Trachea normal and normal range of motion. Neck supple. No thyroid mass and no thyromegaly present.   Cardiovascular:  Normal rate, regular rhythm, S1 normal, S2 normal and normal heart sounds.    Pulmonary/Chest: Effort normal and breath sounds normal. No respiratory distress.   Abdominal: Soft. Normal appearance and bowel sounds are normal. There is no hepatosplenomegaly. There is no tenderness. There is no CVA tenderness. No hernia.   Musculoskeletal: Normal range of motion.   Lymphadenopathy:     She has no cervical adenopathy.        Right: No supraclavicular adenopathy present.        Left: No supraclavicular adenopathy present.   Neurological: She is alert.   Skin: Skin is warm. No rash noted. No pallor.   Psychiatric: She has a normal mood and affect. Her behavior is normal. Judgment normal.   Vitals reviewed.          Assessment:       1. Routine general medical examination at a health care facility     2. Impaired fasting glucose  Hemoglobin A1C    Comprehensive metabolic panel    CBC and differential   3. Essential hypertension  Comprehensive metabolic panel    CBC and differential    Lipid panel   4. Coronary artery disease involving native coronary artery of native heart without angina pectoris  Comprehensive metabolic panel    Lipid panel   5. Adverse reaction to tetanus vaccine, subsequent encounter             Plan:       Reviewed fasting labs with pt, copy given.  Counseling/Anticipatory Guidance:  Nutrition, physical activity, healthy weight, injury prevention, misuse of tobacco, alcohol and drugs, dental health, mental health, immunizations, screenings.   Reminded pt due to screening colonoscopy.   Discussed the patient's BMI with her.  Body mass index is 38.6 kg/(m^2).  The BMI is above average; BMI management plan is completed.    HIgh BMI Follow Up   BMI Follow Up Care Plan Documented   Encouragement to Exercise  Advanced directive paperwork given and explained to patient. Encouraged to complete and return to office.   Care plan letter to MyChart.   FU in 6 months, fasting labs first and then OV.     Talton Delpriore  T. Matilde Haymaker, MD          Procedures

## 2014-11-21 NOTE — Progress Notes (Signed)
The following portions were reviewed and updated accordingly:   Current medications, drug allergies, past medical history, past surgical history, past family history & past social history.     Has the patient sought any care outside of the [facility]? No   Refer to Care Team? No     Health Maintenance Reviewed. Colonoscopy due - patient has been informed.

## 2014-11-25 NOTE — Progress Notes (Signed)
Pt came in for an apt on 11/21/2014

## 2014-12-05 ENCOUNTER — Telehealth: Payer: Self-pay

## 2014-12-05 NOTE — Telephone Encounter (Signed)
Done 03/2014 at [facility]. Results will be faxed

## 2014-12-05 NOTE — Telephone Encounter (Signed)
Patient states she had a mammogram done at [facility] last year.  She was unsure of the exact date.

## 2014-12-05 NOTE — Telephone Encounter (Signed)
Left message on personally identified voicemail for pt to call back concerning mammogram

## 2014-12-07 ENCOUNTER — Encounter: Payer: Self-pay | Admitting: Internal Medicine

## 2015-01-02 ENCOUNTER — Other Ambulatory Visit: Payer: Self-pay | Admitting: Obstetrics and Gynecology

## 2015-01-03 LAB — CYTOLOGY - PAP

## 2015-01-12 ENCOUNTER — Ambulatory Visit (INDEPENDENT_AMBULATORY_CARE_PROVIDER_SITE_OTHER): Payer: Commercial Managed Care - POS | Admitting: Internal Medicine

## 2015-01-13 ENCOUNTER — Encounter (INDEPENDENT_AMBULATORY_CARE_PROVIDER_SITE_OTHER): Payer: Self-pay | Admitting: Internal Medicine

## 2015-01-13 ENCOUNTER — Ambulatory Visit (INDEPENDENT_AMBULATORY_CARE_PROVIDER_SITE_OTHER): Payer: Commercial Managed Care - POS | Admitting: Internal Medicine

## 2015-01-13 VITALS — BP 158/84 | HR 73 | Temp 98.4°F | Ht 62.4 in | Wt 217.0 lb

## 2015-01-13 DIAGNOSIS — J40 Bronchitis, not specified as acute or chronic: Secondary | ICD-10-CM

## 2015-01-13 DIAGNOSIS — I2510 Atherosclerotic heart disease of native coronary artery without angina pectoris: Secondary | ICD-10-CM

## 2015-01-13 MED ORDER — DOXYCYCLINE HYCLATE 100 MG PO CAPS
100.0000 mg | ORAL_CAPSULE | Freq: Two times a day (BID) | ORAL | Status: DC
Start: 2015-01-13 — End: 2016-01-08

## 2015-01-13 NOTE — Progress Notes (Signed)
Has the patient sought any care outside of the [facility]? No  Refer to Care Team? No      Informed due for Colonoscopy.

## 2015-01-13 NOTE — Progress Notes (Signed)
Subjective:      Patient ID: [name] [name] is a 59 y.o. female  Chief Complaint   Patient presents with   . Cough     Coughing with congestion since last Thursday.   . Nasal Congestion        Cough  This is a new problem. The current episode started in the past 7 days. The problem has been unchanged. The problem occurs constantly. The cough is productive of sputum (thick). Associated symptoms include ear congestion, ear pain (pressure), a fever, headaches (pressure), nasal congestion, rhinorrhea, a sore throat (improving) and shortness of breath (sometimes). Pertinent negatives include no postnasal drip. She has tried OTC cough suppressant for the symptoms. The treatment provided no relief. Her past medical history is significant for pneumonia. There is no history of asthma, bronchitis or environmental allergies.     Husband was sick with pneumonia in the hospital. Everyone was sick there. She got sick shortly after.   Can't take decongestants or antihistamines due to allergy. So takes ricola, tussin, ibuprofen and helps a little.     The following portions of the patient's history were reviewed and updated as appropriate: current medications, allergies, past family history, past medical history, past social history, past surgical history and problem list.     Review of Systems   Constitutional: Positive for fever.   HENT: Positive for ear pain (pressure), rhinorrhea and sore throat (improving). Negative for postnasal drip.    Respiratory: Positive for cough and shortness of breath (sometimes).    Allergic/Immunologic: Negative for environmental allergies.   Neurological: Positive for headaches (pressure).        BP 164/87 mmHg  Pulse 64  Temp(Src) 98.4 F (36.9 C) (Oral)  Ht 1.585 m (5' 2.4")  Wt 98.431 kg (217 lb)  BMI 39.18 kg/m2  SpO2 97%   Objective:   Physical Exam   Constitutional: She appears well-developed and well-nourished. No distress.   HENT:   Right Ear: Hearing normal. Tympanic membrane is  not injected and not erythematous. No middle ear effusion.   Left Ear: Hearing normal. Tympanic membrane is not injected and not erythematous.  No middle ear effusion.   Nose: Mucosal edema present. No rhinorrhea. Right sinus exhibits no maxillary sinus tenderness and no frontal sinus tenderness. Left sinus exhibits no maxillary sinus tenderness and no frontal sinus tenderness.   Mouth/Throat: Mucous membranes are normal. No oropharyngeal exudate, posterior oropharyngeal edema or posterior oropharyngeal erythema.   Eyes: Conjunctivae are normal.   Cardiovascular: Normal rate, regular rhythm and normal heart sounds.    No murmur heard.  Pulmonary/Chest: Effort normal and breath sounds normal. She has no wheezes. She has no rales.   Lymphadenopathy:     She has no cervical adenopathy.        Right: No supraclavicular adenopathy present.        Left: No supraclavicular adenopathy present.   Skin: No rash noted.   Nursing note and vitals reviewed.      Assessment:     1. Bronchitis  doxycycline (VIBRAMYCIN) 100 MG capsule   2. Coronary artery disease involving native coronary artery of native heart without angina pectoris           Plan:     59 y.o. female presents with >1 week of cough, not improving, husband with recent pneumonia and was in a hospital during that time.   -doxy x 7 days, avoid azithro due to cardiac history  -Risk & Benefits of the  new medication(s) were explained to the patient (and family) who verbalized understanding & agreed to the treatment plan. Patient (family) encouraged to contact me/clinical staff with any questions/concerns  -pt with hx of medication reactions, advised to observe after taking first dose and to be seen immediately if reaction develops. Pt reports she can't take antihistamines.   -counseled on signs/symptoms to warrant re-evaluation     Return for follow up current symptoms as needed.     Gust Brooms, MD

## 2015-01-13 NOTE — Patient Instructions (Signed)
Adult Self-Care for Colds  Colds are caused by viruses. They can't be cured with antibiotics. However, you can relieve symptoms and support your body's efforts to heal itself. No matter which symptoms you have, be sure to drink plenty of fluids (water or clear soup); stop smoking and drinking alcohol; and get plenty of rest.   Understand a fever   Take your temperature several times a day. If your fever is100.4F(38.0C) for more than a day, call your doctor.   Relax, lie down. Go to bed if you want. Just get off your feet and rest. Also, drink plenty of fluids to avoid dehydration.   Take acetaminophen or a nonsteroidal anti-inflammatory agent (NSAID), such as ibuprofen.  Treat a troubled nose kindly   Breathe steam or heated humidified air to open blocked nasal passages. Stand in a hot shower or use a vaporizer. Be careful not to get burned by the steam.   Saline nasal sprays and decongestant tablets help open a stuffy nose. Antihistamines can also help, but they can cause side effects such as drowsiness and drying of the eyes, nose, and mouth.  Soothe a sore throat and cough   Gargle every2hours with1/4teaspoon of salt dissolved in1/2 cup of warm water. Suck on throat lozenges and cough drops to moisten your throat.   Cough medicines are available but it is unclear how effective they actually are.   Take acetaminophen or an NSAID, such as ibuprofen to ease throat pain  Ease digestive problems   Put fluid back into your body. Take frequent sips of clear liquids such as water or broth. Do not drink beverages with a lot of sugar in them, such as juices and sodas. These can make diarrhea worse. Older children and adults can drink sports drinks.   As your appetite returns, you can resume your normal diet. Ask your doctor whether there are any foods you should avoid.       2000-2015 [employer] [address], [place] [address]. All rights reserved. This information is not  intended as a substitute for professional medical care. Always follow your healthcare professional's instructions.

## 2015-01-18 ENCOUNTER — Encounter: Payer: Managed Care, Other (non HMO) | Admitting: Internal Medicine

## 2015-01-23 ENCOUNTER — Telehealth (INDEPENDENT_AMBULATORY_CARE_PROVIDER_SITE_OTHER): Payer: Self-pay

## 2015-01-23 NOTE — Telephone Encounter (Signed)
She should be done with the abx, they were given to her on 7/1, no further abx should be needed.

## 2015-01-23 NOTE — Telephone Encounter (Signed)
Called patient regarding maria's note below. Would like to know if medication can be changed. She said that Cefuroxine does not bother her.

## 2015-01-23 NOTE — Telephone Encounter (Signed)
-----   Message from Moody Bruins sent at 01/23/2015 11:36 AM EDT -----  Regarding: Antibiotics  Contact: [phone]  Pt called to inform that her current antibiotics are making her hans feel like  burning,she would like to talk to a nurse. Her best contact # is [phone]

## 2015-01-24 MED ORDER — CEFUROXIME AXETIL 500 MG PO TABS
500.0000 mg | ORAL_TABLET | Freq: Two times a day (BID) | ORAL | Status: AC
Start: 2015-01-24 — End: 2015-01-31

## 2015-01-24 NOTE — Telephone Encounter (Signed)
[name] [name] will call patient to inform her that medication was sent to [facility] in [place].

## 2015-01-24 NOTE — Telephone Encounter (Signed)
She should take antibiotics as soon as they are prescribed and not wait in the future. She should also let us know right away if she has a reaction. Cefuroxime prescribed.

## 2015-01-24 NOTE — Telephone Encounter (Signed)
Called patient regarding Dr. Janit Pagan note.  Patient stated that she is very sorry for calling us this late and knows that the antibiotic should have been finished by now but she only took 1 and it made her very sick. She cannot take this antibiotic.  She was feeling better and thought she did not need it but now she is having the same problem and need to take and antibiotic and wants cefuroxine as it's the only one that does not make her sick.  Please advise.  Thank you.

## 2015-01-25 ENCOUNTER — Encounter: Payer: Self-pay | Admitting: Internal Medicine

## 2015-01-25 ENCOUNTER — Ambulatory Visit (INDEPENDENT_AMBULATORY_CARE_PROVIDER_SITE_OTHER): Payer: BLUE CROSS/BLUE SHIELD | Admitting: Internal Medicine

## 2015-01-25 VITALS — BP 112/76 | Temp 98.1°F | Ht 63.5 in | Wt 138.2 lb

## 2015-01-25 DIAGNOSIS — E785 Hyperlipidemia, unspecified: Secondary | ICD-10-CM | POA: Diagnosis not present

## 2015-01-25 DIAGNOSIS — Z0000 Encounter for general adult medical examination without abnormal findings: Secondary | ICD-10-CM | POA: Diagnosis not present

## 2015-01-25 LAB — CBC WITH DIFFERENTIAL/PLATELET
BASOS PCT: 0.8 % (ref 0.0–3.0)
Basophils Absolute: 0 10*3/uL (ref 0.0–0.1)
EOS ABS: 0.1 10*3/uL (ref 0.0–0.7)
EOS PCT: 1.4 % (ref 0.0–5.0)
HCT: 37.7 % (ref 36.0–46.0)
Hemoglobin: 12.5 g/dL (ref 12.0–15.0)
LYMPHS ABS: 1.9 10*3/uL (ref 0.7–4.0)
Lymphocytes Relative: 49.6 % — ABNORMAL HIGH (ref 12.0–46.0)
MCHC: 33.3 g/dL (ref 30.0–36.0)
MCV: 92.7 fl (ref 78.0–100.0)
MONOS PCT: 7.3 % (ref 3.0–12.0)
Monocytes Absolute: 0.3 10*3/uL (ref 0.1–1.0)
NEUTROS ABS: 1.6 10*3/uL (ref 1.4–7.7)
Neutrophils Relative %: 40.9 % — ABNORMAL LOW (ref 43.0–77.0)
PLATELETS: 299 10*3/uL (ref 150.0–400.0)
RBC: 4.06 Mil/uL (ref 3.87–5.11)
RDW: 14.5 % (ref 11.5–15.5)
WBC: 3.8 10*3/uL — AB (ref 4.0–10.5)

## 2015-01-25 LAB — LIPID PANEL
Cholesterol: 293 mg/dL — ABNORMAL HIGH (ref 0–200)
HDL: 74.8 mg/dL (ref >39.00)
LDL CALC: 204 mg/dL — AB (ref 0–99)
NonHDL: 218.2
TRIGLYCERIDES: 70 mg/dL (ref 0.0–149.0)
Total CHOL/HDL Ratio: 4
VLDL: 14 mg/dL (ref 0.0–40.0)

## 2015-01-25 LAB — BASIC METABOLIC PANEL
BUN: 16 mg/dL (ref 6–23)
CO2: 32 meq/L (ref 19–32)
CREATININE: 0.95 mg/dL (ref 0.40–1.20)
Calcium: 9.7 mg/dL (ref 8.4–10.5)
Chloride: 102 mEq/L (ref 96–112)
GFR: 77.5 mL/min (ref >60.00)
Glucose, Bld: 90 mg/dL (ref 70–99)
Potassium: 4.4 mEq/L (ref 3.5–5.1)
SODIUM: 140 meq/L (ref 135–145)

## 2015-01-25 LAB — HEPATIC FUNCTION PANEL
ALBUMIN: 4.1 g/dL (ref 3.5–5.2)
ALT: 12 U/L (ref 0–35)
AST: 17 U/L (ref 0–37)
Alkaline Phosphatase: 60 U/L (ref 39–117)
BILIRUBIN DIRECT: 0.1 mg/dL (ref 0.0–0.3)
BILIRUBIN TOTAL: 0.4 mg/dL (ref 0.2–1.2)
Total Protein: 7.3 g/dL (ref 6.0–8.3)

## 2015-01-25 LAB — TSH: TSH: 1.13 u[IU]/mL (ref 0.35–4.50)

## 2015-01-25 NOTE — Progress Notes (Signed)
Subjective:    Patient ID: Leslie Boyer, female    DOB: Mar 13, 1956, 59 y.o.   MRN: [id number]  HPI  59 year old African American female with history of hyperlipidemia and remote tobacco use routine physical. Patient denies any significant interval medical history. She was seen by her gynecologist one month ago. She has family history of ovarian cancer. Her mother diagnosed with ovarian cancer in her 59s. Her mother is currently 42. She's had subtotal hysterectomy in the past. She still has her ovaries. Her gynecologist recommended performing transvaginal ultrasound.  She takes her cholesterol medications irregularly. She has been on Lipitor for primary prevention. She has not taken medication for at least one month.  She has gained approximately 10 pounds in the last 2 years. She does not exercise on a regular basis.   Review of Systems  Constitutional: Negative for activity change, appetite change. weight gain Eyes: Negative for visual disturbance.  Respiratory: Negative for cough, chest tightness and shortness of breath.   Cardiovascular: nocturnal discomfort, not sure if related to GERD Genitourinary: Negative for difficulty urinating.  Neurological: Negative for headaches.  Gastrointestinal: Negative for abdominal pain, heartburn melena or hematochezia Psych: Negative for depression or anxiety Endo:  No polyuria or polydypsia        Past Medical History  Diagnosis Date  . Hyperlipidemia   . History of tobacco abuse   . 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 Osteopenia     History   Social History  . Marital Status: Divorced    Spouse Name: N/A  . Number of Children: N/A  . Years of Education: N/A   Occupational History  . Not on file.   Social History Main Topics  . Smoking status: Former Research scientist (life sciences)  . Smokeless tobacco: Never Used  . Alcohol Use: No  . Drug Use: No  . Sexual Activity: Yes    Birth Control/ Protection: Surgical   Other Topics  Concern  . Not on file   Social History Narrative   Former Smoker   Alcohol use-no     Occupation:  [employer]     3 daughters   1 son   Divorced  (husband had drug problem)     Past Surgical History  Procedure Laterality Date  . Total abdominal hysterectomy    . Multiple tooth extractions  11/2011    Family History  Problem Relation Age of Onset  . Ovarian cancer Mother     2008  . Osteoporosis Mother     Allergies  Allergen Reactions  . Sulfonamide Derivatives     REACTION: Hives  . Penicillins Rash    Current Outpatient Prescriptions on File Prior to Visit  Medication Sig Dispense Refill  . atorvastatin (LIPITOR) 40 MG tablet TAKE 1 TABLET (40 MG TOTAL) BY MOUTH DAILY. (Patient not taking: Reported on 01/25/2015) 90 tablet 3   No current facility-administered medications on file prior to visit.    BP 112/76 mmHg  Temp(Src) 98.1 F (36.7 C) (Oral)  Ht 5' 3.5" (1.613 m)  Wt 138 lb 3.2 oz (62.687 kg)  BMI 24.09 kg/m2  Wt Readings from Last 3 Encounters:  01/25/15 138 lb 3.2 oz (62.687 kg)  02/07/14 134 lb (60.782 kg)  05/24/13 126 lb 8 oz (57.38 kg)   EKG shows sinus bradycardia at 58 bpm.  Objective:   Physical Exam  Constitutional: She is oriented to person, place, and time. She appears well-developed and well-nourished. No distress.  HENT:  Head: Normocephalic and atraumatic.  Right Ear: External ear normal.  Left Ear: External ear normal.  Mouth/Throat: Oropharynx is clear and moist.  Eyes: Conjunctivae and EOM are normal. Pupils are equal, round, and reactive to light.  Neck: Neck supple. No thyromegaly present.  No carotid bruit  Cardiovascular: Normal rate, regular rhythm and normal heart sounds.   No murmur heard. Pulmonary/Chest: Effort normal and breath sounds normal. She has no wheezes.  Abdominal: Soft. Bowel sounds are normal. There is no tenderness.  Musculoskeletal: She exhibits no edema.  Neurological: She is alert and oriented  to person, place, and time. No cranial nerve deficit.  Skin: Skin is warm and dry.  Psychiatric: She has a normal mood and affect. Her behavior is normal.          Assessment & Plan:

## 2015-01-25 NOTE — Patient Instructions (Addendum)
Follow up with your GYN regarding repeat bone density scan Increase intake of fresh fruits and vegetables and start regular exercise program. (monitor your weight) Our office will contact you re: blood test results. We will let you know whether you need to restart Lipitor You will be due for repeat colonoscopy in 2018

## 2015-01-25 NOTE — Assessment & Plan Note (Addendum)
Reviewed adult health maintenance protocols.  She will be due for surveillance colonoscopy in 2018.  Mammogram planned in Sept 2016.  She was seen by her GYN for breast exam along with pelvic exam.  Her GYN has ordered US of her ovaries.  I recommended obtaining repeat DEXA (last one was ordered by GYN).  Healthy diet and regular exercise encouraged.  Obtain screening blood work.  Patient up to date with adult vaccines.

## 2015-01-26 LAB — HIV ANTIBODY (ROUTINE TESTING W REFLEX): HIV 1&2 Ab, 4th Generation: NONREACTIVE

## 2015-01-27 ENCOUNTER — Other Ambulatory Visit: Payer: Self-pay | Admitting: Internal Medicine

## 2015-01-27 MED ORDER — ATORVASTATIN CALCIUM 20 MG PO TABS: 20.0000 mg | ORAL_TABLET | Freq: Every day | ORAL | Status: DC

## 2015-03-02 ENCOUNTER — Ambulatory Visit (INDEPENDENT_AMBULATORY_CARE_PROVIDER_SITE_OTHER): Payer: Commercial Managed Care - POS | Admitting: Cardiovascular Disease

## 2015-04-14 ENCOUNTER — Telehealth (INDEPENDENT_AMBULATORY_CARE_PROVIDER_SITE_OTHER): Payer: Self-pay

## 2015-04-14 NOTE — Telephone Encounter (Signed)
Would recommend evaluation but given no available slots today, may go to local [facility].

## 2015-04-14 NOTE — Telephone Encounter (Signed)
Pt called, c/o pain in right side of chest going into right scapula.  Described as a dull ache.  Pt notices it when sitting/resting but once she moves around does not notice it.  Pt also c/o increased belching, no burning indigestion, no SOB or dizziness, no pain in right arm.  Pain not associated with movement.  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  Pt unsure what to do at this point.  Please advise.

## 2015-04-14 NOTE — Telephone Encounter (Signed)
L/M on pt mobile phone to call back

## 2015-04-14 NOTE — Telephone Encounter (Signed)
Attempted to call pt again, L/M on pt mobile phone to call back.

## 2015-04-17 ENCOUNTER — Ambulatory Visit (INDEPENDENT_AMBULATORY_CARE_PROVIDER_SITE_OTHER): Payer: Commercial Managed Care - POS | Admitting: Cardiovascular Disease

## 2015-04-17 ENCOUNTER — Encounter (INDEPENDENT_AMBULATORY_CARE_PROVIDER_SITE_OTHER): Payer: Self-pay | Admitting: Cardiovascular Disease

## 2015-04-17 VITALS — BP 120/80 | HR 70 | Ht 64.0 in | Wt 214.0 lb

## 2015-04-17 DIAGNOSIS — I10 Essential (primary) hypertension: Secondary | ICD-10-CM

## 2015-04-17 DIAGNOSIS — I2510 Atherosclerotic heart disease of native coronary artery without angina pectoris: Secondary | ICD-10-CM

## 2015-04-17 NOTE — Progress Notes (Signed)
[facility]  [address]  [place], [place] [address]  Dept: [phone]  Dept Fax: [phone]      Chief Complaint   Patient presents with   . Coronary Artery Disease     58mo f/u         History of Present Illness       [name] [name] is a 59 y.o. female who presents for one year follow up visit and for follow up for hypertension.  Patient has history of no previous cardiac procedures.    Complains of right sides cp's which started while painting a chair few days ago.  The cp lasts few hours and occurs when laying on side.  No exertional component.     Patient exercises regularly 2-3 times per week.    Past Medical History     Past Medical History   Diagnosis Date   . Left ventricular hypertrophy 2010     s/p echo   . Coronary artery disease, non-occlusive 2010     nonobstructive s/p cath 2010   . Hypertensive disorder 2004   . Pap smear for cervical cancer screening 07/25/2101     no hx of abnormal pap smear   . H/O mammogram 03/15/2102      normal    . Fibroid, uterine 05/2012   . Ovarian cyst, right 05/2012   . H/O echocardiogram 06/28/13     EF 65%, mild LVH       Past Surgical History     Past Surgical History   Procedure Laterality Date   . Cardiac catheterization  2010     non-obs CAD   . Tubal ligation  1984   . Inguinal hernia repair  2004     right   . Myomectomy  04/2014       Family History     Family History   Problem Relation Age of Onset   . Breast cancer Mother 39     breast ca    . Bone cancer Mother    . Heart failure Sister    . Heart failure Brother        Social History     Social History     Social History   . Marital Status: Married     Spouse Name: N/A   . Number of Children: N/A   . Years of Education: N/A     Occupational History   . Not on file.     Social History Main Topics   . Smoking status: Never Smoker    . Smokeless tobacco: Never Used   . Alcohol Use: 0.0 oz/week      Comment: socially   . Drug Use: No   . Sexual Activity:     Partners: Male      Birth Control/ Protection: Surgical      Comment: s/p BTL     Other Topics Concern   . Not on file     Social History Narrative       Allergies     Allergies   Allergen Reactions   . Aspirin Shortness Of Breath and Rash   . Levaquin [Levofloxacin Hemihydrate]      Joint pains   . Sudafed [Pseudoephedrine] Swelling   . Tetanus Immune Globulin      Skin reaction       Medications     Current Outpatient Prescriptions   Medication Sig Dispense Refill   . Calcium-Magnesium 500-250 MG Tab  Take 1 tablet by mouth daily.     . carvedilol (COREG) 25 MG tablet Take 2 tablets (50 mg total) by mouth 2 (two) times daily with meals. 120 tablet 11   . Cholecalciferol (VITAMIN D) 2000 UNITS Cap Take 2,000 Unit by mouth daily.     Marland Kitchen doxycycline (VIBRAMYCIN) 100 MG capsule Take 1 capsule (100 mg total) by mouth 2 (two) times daily. 14 capsule 0   . SOY ISOFLAVONE PO Take 250 mg by mouth daily.     Marland Kitchen spironolactone (ALDACTONE) 50 MG tablet Take 1 tablet (50 mg total) by mouth daily. 30 tablet 11   . valsartan-hydrochlorothiazide (DIOVAN-HCT) 320-12.5 MG per tablet Take 1 tablet by mouth daily. 30 tablet 11     No current facility-administered medications for this visit.         Review of Systems     Constitutional: Negative for fevers and chills  Respiratory: Negative for cough, wheezing, or hemoptysis  Cardiovascular: as per HPI  Gastrointestinal: Negative for abdominal pain, nausea, vomiting and diarrhea      Physical Exam     Filed Vitals:    04/17/15 0954   BP: 120/80   Pulse: 70      Wt Readings from Last 1 Encounters:   04/17/15 97.07 kg (214 lb)     Body mass index is 36.72 kg/(m^2).    Neck: normal carotid upstroke, no JVD, no carotid bruit  Respiratory: Clear to auscultation and percussion throughout. Respiratory effort unlabored, chest expansion symmetric.    Cardio: Regular rate and rhythm, Normal S1/S2, no murmur, no gallop, no rub  GI: Soft, nondistended, nontender.  No guarding or rebound.  Extremities: warm, pulses 2+,  no peripheral edema        Labs and diagnostic studies       CBC:   WBC   Date/Time Value Ref Range Status   11/18/2014 09:32 AM 5.35 3.50 - 10.80 x10 3/uL Final   10/31/2008 10:58 AM 4.06 3.50 - 10.80 /CUMM Final     HGB   Date/Time Value Ref Range Status   11/18/2014 09:32 AM 13.5 12.0 - 16.0 g/dL Final     HEMATOCRIT   Date/Time Value Ref Range Status   11/18/2014 09:32 AM 41.9 37.0 - 47.0 % Final     PLATELETS   Date/Time Value Ref Range Status   11/18/2014 09:32 AM 132* 140 - 400 x10 3/uL Final     CMP:   SODIUM   Date/Time Value Ref Range Status   11/18/2014 09:32 AM 142 135 - 146 mEq/L Final     POTASSIUM   Date/Time Value Ref Range Status   11/18/2014 09:32 AM 4.6 3.5 - 5.3 mEq/L Final     CHLORIDE   Date/Time Value Ref Range Status   11/18/2014 09:32 AM 108 100 - 111 mEq/L Final   05/11/2013 09:22 AM 105 98 - 110 mmol/L Final     CO2   Date/Time Value Ref Range Status   11/18/2014 09:32 AM 26 21 - 30 mEq/L Final     BUN   Date/Time Value Ref Range Status   11/18/2014 09:32 AM 22.0* 7.0 - 19.0 mg/dL Final     CREATININE   Date/Time Value Ref Range Status   11/18/2014 09:32 AM 1.1 0.4 - 1.5 mg/dL Final   91/47/8295 62:13 AM 0.99 0.50 - 1.05 mg/dL Final     GLUCOSE   Date/Time Value Ref Range Status   11/18/2014 09:32 AM 111* 70 - 100  mg/dL Final     Comment:     Interpretive Data for Adult Female and Female Population  Indeterminate Range:  100-125 mg/dL  Equal to or greater than 126 mg/dL meets the ADA  guidelines for Diabetes Mellitus diagnosis if symptoms  are present and confirmed by repeat testing.  Random (Non-Fasting)Interpretive Data (Adults):  Equal to or greater than 200 mg/dL meets the ADA  guidelines for Diabetes Mellitus diagnosis if symptoms  are present and confirmed by Fasting Glucose or GTT.       Lipid Panel:    CHOLESTEROL   Date/Time Value Ref Range Status   11/18/2014 09:32 AM 161 0 - 199 mg/dL Final     LDL CALCULATED   Date/Time Value Ref Range Status   11/18/2014 09:32 AM 112* 0 - 99  mg/dL Final     HDL   Date/Time Value Ref Range Status   11/18/2014 09:32 AM 36* >40 mg/dL Final     Comment:     HDL:       Less than 40 mg/dL - Major risk heart disease       Greater than or equal to 60 mg/dL - Negative risk factor       for heart disease       TRIGLYCERIDES   Date/Time Value Ref Range Status   11/18/2014 09:32 AM 63 34 - 149 mg/dL Final     Diagnostic studies     Echocardiogram:  2012 = left ventricular hypertrophy    Nuclear stress test:  2010 = false positive nuclear study with lateral ischemia.    Cardiac catheterization:  11/02/08 =LAD-mid 40%t, D1 - 50%.      EKG        Assessment and Plan     1. Coronary artery disease involving native coronary artery of native heart without angina pectoris  Patient has history of mild coronary artery disease based on catheterization in 2010.  She has some atypical cp's which are probably musculoskeletal  Continue to follow symptomatically.    2. Essential hypertension  MRA of the renal arteries 2004 = no renal artery stenosis  She appears be stable.  I recommend continue same medications.  Will check echo to assess LVH - will review via phone.    - carvedilol (COREG) 25 MG tablet; Take 2 tablets (50 mg total) by mouth 2 (two) times daily with meals.  Dispense: 120 tablet; Refill: 11  - spironolactone (ALDACTONE) 50 MG tablet; Take 1 tablet (50 mg total) by mouth daily.  Dispense: 30 tablet; Refill: 11  - valsartan-hydrochlorothiazide (DIOVAN-HCT) 320-12.5 MG per tablet; Take 1 tablet by mouth daily.  Dispense: 30 tablet; Refill: 11  - transthoracic echocariogram    3. Morbid obesity due to excess calories  Recommend weight loss  She has lost about 6 pounds since her last visit.  She has lost 30 lbs total.    Follow-Up Visit     Return in about 6 months (around 10/16/2015).

## 2015-04-18 NOTE — Telephone Encounter (Signed)
Pt seen by cardiologist on 04-17-15

## 2015-04-21 ENCOUNTER — Ambulatory Visit
Admission: RE | Admit: 2015-04-21 | Discharge: 2015-04-21 | Disposition: A | Discharge status: Home / Self Care | Payer: Commercial Managed Care - POS | Source: Ambulatory Visit | Attending: Cardiovascular Disease | Admitting: Cardiovascular Disease

## 2015-04-21 DIAGNOSIS — I10 Essential (primary) hypertension: Secondary | ICD-10-CM

## 2015-04-23 ENCOUNTER — Telehealth (INDEPENDENT_AMBULATORY_CARE_PROVIDER_SITE_OTHER): Payer: Self-pay | Admitting: Cardiovascular Disease

## 2015-04-23 NOTE — Telephone Encounter (Signed)
Moderate LVH  Normal LV function

## 2015-04-24 ENCOUNTER — Ambulatory Visit (HOSPITAL_BASED_OUTPATIENT_CLINIC_OR_DEPARTMENT_OTHER)
Admission: RE | Admit: 2015-04-24 | Payer: Managed Care, Other (non HMO) | Source: Ambulatory Visit | Admitting: Obstetrics and Gynecology

## 2015-04-24 ENCOUNTER — Encounter (HOSPITAL_BASED_OUTPATIENT_CLINIC_OR_DEPARTMENT_OTHER): Admission: RE | Payer: Self-pay | Source: Ambulatory Visit

## 2015-04-24 SURGERY — SALPINGO-OOPHORECTOMY, BILATERAL, LAPAROSCOPIC
Anesthesia: General | Laterality: Bilateral

## 2015-04-24 NOTE — Telephone Encounter (Signed)
LVMTCB

## 2015-04-25 NOTE — Telephone Encounter (Signed)
LM to call office

## 2015-04-27 NOTE — Telephone Encounter (Signed)
Message left to return call.

## 2015-04-27 NOTE — Telephone Encounter (Signed)
Pt has been advised of results and expressed understanding.

## 2015-05-01 ENCOUNTER — Encounter (INDEPENDENT_AMBULATORY_CARE_PROVIDER_SITE_OTHER): Payer: Self-pay | Admitting: Family Medicine

## 2015-05-02 ENCOUNTER — Encounter: Payer: Self-pay | Admitting: Gastroenterology

## 2015-05-03 ENCOUNTER — Encounter: Payer: Self-pay | Admitting: Family Medicine

## 2015-05-03 ENCOUNTER — Ambulatory Visit (INDEPENDENT_AMBULATORY_CARE_PROVIDER_SITE_OTHER): Payer: BLUE CROSS/BLUE SHIELD | Admitting: Family Medicine

## 2015-05-03 VITALS — BP 119/73 | HR 59 | Temp 98.8°F | Ht 63.5 in | Wt 138.0 lb

## 2015-05-03 DIAGNOSIS — G5601 Carpal tunnel syndrome, right upper limb: Secondary | ICD-10-CM

## 2015-05-03 NOTE — Progress Notes (Signed)
   Subjective:    Patient ID: Leslie Boyer, female    DOB: [birth year], 59 y.o.   MRN: [id number]  HPI Here for 2 weeks of pain in the right forearm and hand, and this pain sometimes radiates up into the upper arm. Her hand also feels numb or tingles at times. No hx of trauma. No swelling. Tylenol has not helped. 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 The pain bothers her more in bed at night than it does during the day.    Review of Systems  Constitutional: Negative.   Respiratory: Negative.   Cardiovascular: Negative.   Musculoskeletal: Positive for myalgias. Negative for joint swelling.  Neurological: Positive for numbness. Negative for weakness.       Objective:   Physical Exam  Constitutional: She appears well-developed and well-nourished.  Cardiovascular: Normal rate, regular rhythm, normal heart sounds and intact distal pulses.   Pulmonary/Chest: Effort normal and breath sounds normal.  Musculoskeletal:  Her right arm and hand appear normal on exam. No tenderness or swelling. Full ROM           Assessment & Plan:  This sounds like carpal tunnel syndrome. I advised her to rest the hand as much as possible and to avoid any computer use at home for a few weeks. Take Aleve bid. Wear  A wrist splint every night around the house and in bed. Recheck prn

## 2015-05-03 NOTE — Progress Notes (Signed)
Pre visit review using our clinic review tool, if applicable. No additional management support is needed unless otherwise documented below in the visit note. 

## 2015-05-11 LAB — HM MAMMOGRAPHY

## 2015-06-07 ENCOUNTER — Encounter: Payer: Self-pay | Admitting: Internal Medicine

## 2015-09-20 ENCOUNTER — Encounter (INDEPENDENT_AMBULATORY_CARE_PROVIDER_SITE_OTHER): Payer: Self-pay | Admitting: Cardiovascular Disease

## 2015-09-20 ENCOUNTER — Encounter (INDEPENDENT_AMBULATORY_CARE_PROVIDER_SITE_OTHER): Payer: Self-pay

## 2015-09-20 ENCOUNTER — Other Ambulatory Visit (INDEPENDENT_AMBULATORY_CARE_PROVIDER_SITE_OTHER): Payer: Self-pay | Admitting: Cardiovascular Disease

## 2015-09-20 DIAGNOSIS — I10 Essential (primary) hypertension: Secondary | ICD-10-CM

## 2015-09-20 MED ORDER — CARVEDILOL 25 MG PO TABS
50.0000 mg | ORAL_TABLET | Freq: Two times a day (BID) | ORAL | Status: DC
Start: 2015-09-20 — End: 2016-02-12

## 2015-10-04 ENCOUNTER — Other Ambulatory Visit (INDEPENDENT_AMBULATORY_CARE_PROVIDER_SITE_OTHER): Payer: Self-pay

## 2015-10-04 DIAGNOSIS — I10 Essential (primary) hypertension: Secondary | ICD-10-CM

## 2015-10-04 MED ORDER — SPIRONOLACTONE 50 MG PO TABS
50.0000 mg | ORAL_TABLET | Freq: Every day | ORAL | Status: DC
Start: 2015-10-04 — End: 2015-10-10

## 2015-10-04 MED ORDER — VALSARTAN-HYDROCHLOROTHIAZIDE 320-12.5 MG PO TABS
1.0000 | ORAL_TABLET | Freq: Every day | ORAL | Status: DC
Start: 2015-10-04 — End: 2015-10-10

## 2015-10-04 NOTE — Telephone Encounter (Signed)
Medication Refill Request    Requested Medication: spironolactone 50 mg, 1 po qd, #30 with no refills and valsartan-hctz 320-12.5 mg, 1 po qd, #30 with no refills    Last Office Visit: 04/17/15      Follow-Up Appointment due: 10/16/15 - pt has been advised and will call tomorrow to schedule fu after checking her schedule    Medications, Pharmacy on file, & Allergies have been reconciled, please review and approve the ordered medication(s).     Thank you,    Houston Siren, MA

## 2015-10-10 ENCOUNTER — Other Ambulatory Visit (INDEPENDENT_AMBULATORY_CARE_PROVIDER_SITE_OTHER): Payer: Self-pay | Admitting: Cardiovascular Disease

## 2015-10-10 DIAGNOSIS — I10 Essential (primary) hypertension: Secondary | ICD-10-CM

## 2015-10-10 MED ORDER — VALSARTAN-HYDROCHLOROTHIAZIDE 320-12.5 MG PO TABS
1.0000 | ORAL_TABLET | Freq: Every day | ORAL | Status: DC
Start: 2015-10-10 — End: 2016-01-01

## 2015-10-10 MED ORDER — SPIRONOLACTONE 50 MG PO TABS
50.0000 mg | ORAL_TABLET | Freq: Every day | ORAL | Status: DC
Start: 2015-10-10 — End: 2015-11-29

## 2015-10-10 NOTE — Telephone Encounter (Signed)
Medication Refill Request    Requested Medication: aldactone 50 gm, diovan HCT 320-12.5 mg    Last Office Visit: 04/17/2015    Follow-Up Appointment due: 6 months    Medications, Pharmacy on file, & Allergies have been reconciled, please review and approve the ordered medication(s).     Thank you,    Aletha Halim, RN BSN

## 2015-11-21 DIAGNOSIS — L669 Cicatricial alopecia, unspecified: Secondary | ICD-10-CM | POA: Diagnosis not present

## 2015-11-21 DIAGNOSIS — L299 Pruritus, unspecified: Secondary | ICD-10-CM | POA: Diagnosis not present

## 2015-11-29 ENCOUNTER — Other Ambulatory Visit (INDEPENDENT_AMBULATORY_CARE_PROVIDER_SITE_OTHER): Payer: Self-pay | Admitting: Cardiovascular Disease

## 2015-11-29 DIAGNOSIS — I10 Essential (primary) hypertension: Secondary | ICD-10-CM

## 2015-11-29 MED ORDER — SPIRONOLACTONE 50 MG PO TABS
50.0000 mg | ORAL_TABLET | Freq: Every day | ORAL | Status: DC
Start: 2015-11-29 — End: 2016-01-08

## 2015-11-29 NOTE — Telephone Encounter (Signed)
Pt called to request a refill on her spironolactone (ALDACTONE) 50 MG tablet    She made her f/u appt w/ dr Jonathon Bellows on 12/20/15    Pharmacy- [facility] on file

## 2015-11-29 NOTE — Telephone Encounter (Signed)
Has appt schedule for 12/20/15

## 2015-12-20 ENCOUNTER — Ambulatory Visit (INDEPENDENT_AMBULATORY_CARE_PROVIDER_SITE_OTHER): Payer: Commercial Managed Care - POS | Admitting: Cardiovascular Disease

## 2016-01-01 ENCOUNTER — Ambulatory Visit (INDEPENDENT_AMBULATORY_CARE_PROVIDER_SITE_OTHER): Payer: Commercial Managed Care - POS | Admitting: Cardiovascular Disease

## 2016-01-01 ENCOUNTER — Other Ambulatory Visit (INDEPENDENT_AMBULATORY_CARE_PROVIDER_SITE_OTHER): Payer: Self-pay | Admitting: Cardiovascular Disease

## 2016-01-01 DIAGNOSIS — I10 Essential (primary) hypertension: Secondary | ICD-10-CM

## 2016-01-01 MED ORDER — VALSARTAN-HYDROCHLOROTHIAZIDE 320-12.5 MG PO TABS
1.0000 | ORAL_TABLET | Freq: Every day | ORAL | Status: DC
Start: 2016-01-01 — End: 2016-01-08

## 2016-01-01 NOTE — Telephone Encounter (Signed)
Pt needs refill of Valsartan.  Follow up rescheduled to Monday.    Please send to Karin Golden at [facility], [place]

## 2016-01-02 DIAGNOSIS — L299 Pruritus, unspecified: Secondary | ICD-10-CM | POA: Diagnosis not present

## 2016-01-02 DIAGNOSIS — L669 Cicatricial alopecia, unspecified: Secondary | ICD-10-CM | POA: Diagnosis not present

## 2016-01-08 ENCOUNTER — Ambulatory Visit (INDEPENDENT_AMBULATORY_CARE_PROVIDER_SITE_OTHER): Payer: Commercial Managed Care - POS | Admitting: Cardiovascular Disease

## 2016-01-08 ENCOUNTER — Encounter (INDEPENDENT_AMBULATORY_CARE_PROVIDER_SITE_OTHER): Payer: Self-pay | Admitting: Cardiovascular Disease

## 2016-01-08 VITALS — BP 146/83 | HR 73 | Ht 64.0 in | Wt 218.0 lb

## 2016-01-08 DIAGNOSIS — I10 Essential (primary) hypertension: Secondary | ICD-10-CM

## 2016-01-08 DIAGNOSIS — I2510 Atherosclerotic heart disease of native coronary artery without angina pectoris: Secondary | ICD-10-CM

## 2016-01-08 MED ORDER — VALSARTAN-HYDROCHLOROTHIAZIDE 320-12.5 MG PO TABS
1.0000 | ORAL_TABLET | Freq: Every day | ORAL | Status: DC
Start: 2016-01-08 — End: 2020-04-06

## 2016-01-08 MED ORDER — SPIRONOLACTONE 50 MG PO TABS
50.0000 mg | ORAL_TABLET | Freq: Every day | ORAL | Status: AC
Start: 2016-01-08 — End: ?

## 2016-01-08 NOTE — Progress Notes (Signed)
Edman Circle, MD, [facility]  [facility]  [address], [place], [place] [address]  [address], [place], [place] [address]  [address], [place], [address], [place], [place] [address]  Scheduling: [phone]  Dept Fax: [phone]      Chief Complaint   Patient presents with   . Coronary Artery Disease     8 month FU denies active cardiac symptoms in office.       History of Present Illness     [name] [name] is a 60 y.o. female who presents for 8 month follow up visit  for follow up for hypertension.  Patient has history of no previous cardiac procedures.    Patient states that she is doing well and has no cardiac complaints.  She denies any chest pains, shortness breath or palpitations.     Patient exercises regularly 2-3 times per week.    Past Medical History     Past Medical History   Diagnosis Date   . Left ventricular hypertrophy 2010     s/p echo   . Coronary artery disease, non-occlusive 2010     nonobstructive s/p cath 2010   . Hypertensive disorder 2004   . Pap smear for cervical cancer screening 07/25/2101     no hx of abnormal pap smear   . H/O mammogram 03/15/2102      normal    . Fibroid, uterine 05/2012   . Ovarian cyst, right 05/2012   . H/O echocardiogram 06/28/13     EF 65%, mild LVH       Past Surgical History     Past Surgical History   Procedure Laterality Date   . Cardiac catheterization  2010     non-obs CAD   . Tubal ligation  1984   . Inguinal hernia repair  2004     right   . Myomectomy  04/2014       Family History     Family History   Problem Relation Age of Onset   . Breast cancer Mother 27     breast ca    . Bone cancer Mother    . Heart failure Sister    . Heart failure Brother        Social History     Social History     Social History   . Marital Status: Married     Spouse Name: N/A   . Number of Children: N/A   . Years of Education: N/A     Occupational History   . Not on file.     Social History Main Topics   . Smoking status: Never Smoker    . Smokeless  tobacco: Never Used   . Alcohol Use: 0.0 oz/week      Comment: socially   . Drug Use: No   . Sexual Activity:     Partners: Male     Birth Control/ Protection: Surgical      Comment: s/p BTL     Other Topics Concern   . Not on file     Social History Narrative       Allergies     Allergies   Allergen Reactions   . Aspirin Shortness Of Breath and Rash   . Levaquin [Levofloxacin Hemihydrate]      Joint pains   . Sudafed [Pseudoephedrine] Swelling   . Tetanus Immune Globulin      Skin reaction       Medications  Current Outpatient Prescriptions   Medication Sig Dispense Refill   . Calcium-Magnesium 500-250 MG Tab Take 1 tablet by mouth daily.     . carvedilol (COREG) 25 MG tablet Take 2 tablets (50 mg total) by mouth 2 (two) times daily with meals. 120 tablet 0   . Cholecalciferol (VITAMIN D) 2000 UNITS Cap Take 2,000 Unit by mouth daily.     . SOY ISOFLAVONE PO Take 250 mg by mouth daily.     Marland Kitchen spironolactone (ALDACTONE) 50 MG tablet Take 1 tablet (50 mg total) by mouth daily. 30 tablet 0   . valsartan-hydroCHLOROthiazide (DIOVAN-HCT) 320-12.5 MG per tablet Take 1 tablet by mouth daily. 30 tablet 0     No current facility-administered medications for this visit.     Review of Systems     Constitutional: Negative for fevers and chills  Respiratory: Negative for cough, wheezing, or hemoptysis  Cardiovascular: as per HPI  Gastrointestinal: Negative for abdominal pain, nausea, vomiting and diarrhea    Physical Exam     Filed Vitals:    01/08/16 1416   BP: 146/83   Pulse: 73      Wt Readings from Last 1 Encounters:   01/08/16 98.884 kg (218 lb)     Body mass index is 37.4 kg/(m^2).    Neck: normal carotid upstroke, no JVD, no carotid bruit  Respiratory: Clear to auscultation and percussion throughout. Respiratory effort unlabored, chest expansion symmetric.    Cardio: Regular rate and rhythm, Normal S1/S2, no murmur, no gallop, no rub  GI: Soft, nondistended, nontender.  No guarding or rebound.  Extremities: warm,  pulses 2+, no peripheral edema    Labs and diagnostic studies     CBC:   WBC   Date/Time Value Ref Range Status   11/18/2014 09:32 AM 5.35 3.50 - 10.80 x10 3/uL Final   10/31/2008 10:58 AM 4.06 3.50 - 10.80 /CUMM Final     HGB   Date/Time Value Ref Range Status   11/18/2014 09:32 AM 13.5 12.0 - 16.0 g/dL Final     HEMATOCRIT   Date/Time Value Ref Range Status   11/18/2014 09:32 AM 41.9 37.0 - 47.0 % Final     PLATELETS   Date/Time Value Ref Range Status   11/18/2014 09:32 AM 132* 140 - 400 x10 3/uL Final     CMP:   SODIUM   Date/Time Value Ref Range Status   11/18/2014 09:32 AM 142 135 - 146 mEq/L Final     POTASSIUM   Date/Time Value Ref Range Status   11/18/2014 09:32 AM 4.6 3.5 - 5.3 mEq/L Final     CHLORIDE   Date/Time Value Ref Range Status   11/18/2014 09:32 AM 108 100 - 111 mEq/L Final   05/11/2013 09:22 AM 105 98 - 110 mmol/L Final     CO2   Date/Time Value Ref Range Status   11/18/2014 09:32 AM 26 21 - 30 mEq/L Final     BUN   Date/Time Value Ref Range Status   11/18/2014 09:32 AM 22.0* 7.0 - 19.0 mg/dL Final     CREATININE   Date/Time Value Ref Range Status   11/18/2014 09:32 AM 1.1 0.4 - 1.5 mg/dL Final   81/19/1478 29:56 AM 0.99 0.50 - 1.05 mg/dL Final     GLUCOSE   Date/Time Value Ref Range Status   11/18/2014 09:32 AM 111* 70 - 100 mg/dL Final     Comment:     Interpretive Data for Adult Female and Female Population  Indeterminate Range:  100-125 mg/dL  Equal to or greater than 126 mg/dL meets the ADA  guidelines for Diabetes Mellitus diagnosis if symptoms  are present and confirmed by repeat testing.  Random (Non-Fasting)Interpretive Data (Adults):  Equal to or greater than 200 mg/dL meets the ADA  guidelines for Diabetes Mellitus diagnosis if symptoms  are present and confirmed by Fasting Glucose or GTT.       Lipid Panel:    CHOLESTEROL   Date/Time Value Ref Range Status   11/18/2014 09:32 AM 161 0 - 199 mg/dL Final     LDL CALCULATED   Date/Time Value Ref Range Status   11/18/2014 09:32 AM 112* 0 -  99 mg/dL Final     HDL   Date/Time Value Ref Range Status   11/18/2014 09:32 AM 36* >40 mg/dL Final     Comment:     HDL:       Less than 40 mg/dL - Major risk heart disease       Greater than or equal to 60 mg/dL - Negative risk factor       for heart disease       TRIGLYCERIDES   Date/Time Value Ref Range Status   11/18/2014 09:32 AM 63 34 - 149 mg/dL Final     Diagnostic studies     Echocardiogram:  2012 = left ventricular hypertrophy  04/21/15 EF 65%.  Moderate LVH.  No WMA.  No valvular heart disease    Nuclear stress test:  2010 = false positive nuclear study with lateral ischemia.    Cardiac catheterization:  11/02/08 =LAD mid 40%, D1 50%.      EKG        Assessment and Plan     1. Coronary artery disease involving native coronary artery of native heart without angina pectoris  Patient is doing well with no symptoms of angina or anginal equivalent.    2. Essential hypertension  MRA of the renal arteries 2004 = no renal artery stenosis  Echocardiogram from 04/21/15 show moderate degree of LVH.  Currently on carvedilol 25 mg twice a day, Diovan HCT 320/12.5 mg daily and spironolactone 50 mg daily.  She does not check her blood pressure home.  She will check her blood pressure home and call me if her blood pressure is above 140.  Her blood pressure today is elevated due to whitecoat syndrome.    3. Morbid obesity due to excess calories  Recommend weight loss    Follow-Up Visit     Return in about 6 months (around 07/09/2016).

## 2016-01-08 NOTE — Addendum Note (Signed)
Addended by: Katrine Coho on: 01/08/2016 03:56 PM     Modules accepted: Orders

## 2016-01-18 DIAGNOSIS — Z01419 Encounter for gynecological examination (general) (routine) without abnormal findings: Secondary | ICD-10-CM | POA: Diagnosis not present

## 2016-01-18 DIAGNOSIS — Z6823 Body mass index (BMI) 23.0-23.9, adult: Secondary | ICD-10-CM | POA: Diagnosis not present

## 2016-01-29 ENCOUNTER — Ambulatory Visit: Payer: Commercial Managed Care - POS | Attending: Cardiovascular Disease

## 2016-01-29 ENCOUNTER — Ambulatory Visit (INDEPENDENT_AMBULATORY_CARE_PROVIDER_SITE_OTHER): Payer: Commercial Managed Care - POS | Admitting: Cardiovascular Disease

## 2016-01-29 ENCOUNTER — Encounter (INDEPENDENT_AMBULATORY_CARE_PROVIDER_SITE_OTHER): Payer: Self-pay | Admitting: Cardiovascular Disease

## 2016-01-29 VITALS — BP 140/80 | HR 58 | Ht 64.0 in | Wt 218.0 lb

## 2016-01-29 DIAGNOSIS — Z0189 Encounter for other specified special examinations: Secondary | ICD-10-CM

## 2016-01-29 DIAGNOSIS — R0789 Other chest pain: Secondary | ICD-10-CM | POA: Insufficient documentation

## 2016-01-29 DIAGNOSIS — R0683 Snoring: Secondary | ICD-10-CM | POA: Insufficient documentation

## 2016-01-29 DIAGNOSIS — R7301 Impaired fasting glucose: Secondary | ICD-10-CM | POA: Insufficient documentation

## 2016-01-29 DIAGNOSIS — I10 Essential (primary) hypertension: Secondary | ICD-10-CM

## 2016-01-29 DIAGNOSIS — I2510 Atherosclerotic heart disease of native coronary artery without angina pectoris: Secondary | ICD-10-CM | POA: Insufficient documentation

## 2016-01-29 LAB — LIPID PANEL
Cholesterol / HDL Ratio: 5.1
Cholesterol: 177 mg/dL (ref 0–199)
HDL: 35 mg/dL — ABNORMAL LOW (ref 40–9999)
LDL Calculated: 121 mg/dL — ABNORMAL HIGH (ref 0–99)
Triglycerides: 103 mg/dL (ref 34–149)
VLDL Calculated: 21 mg/dL (ref 10–40)

## 2016-01-29 LAB — TSH: TSH: 0.62 u[IU]/mL (ref 0.35–4.94)

## 2016-01-29 LAB — VITAMIN D,25 OH,TOTAL: Vitamin D, 25 OH, Total: 41 ng/mL (ref 30–100)

## 2016-01-29 LAB — CK: Creatine Kinase (CK): 218 U/L — ABNORMAL HIGH (ref 29–168)

## 2016-01-29 LAB — HEMOLYSIS INDEX: Hemolysis Index: 11 (ref 0–18)

## 2016-01-29 NOTE — Patient Instructions (Addendum)
-   Schedule stress exercise test    - schedule regular echocardiogram    - schedule sleep study    - Check blood pressure once a day, after relaxing for 5 minutes and at different times of day.       Your ideal goal blood pressure is less than 120/80 mm Hg as long as you are not dizzy or tired at this blood pressure.       Call if your average blood pressure is higher than 120/80 to adjust your medications       You can purchase an Omron blood pressure cuff for your upper arm if you do not have one.    - Mediterranean Diet: Fresh fruits, vegetables, salads, chicken without the skin, fish (salmon). Minimize red meat. Avoid bread, white flour, cakes, pasta, sugar as best you can. If not allergic, a handful of walnuts or almonds a day is a good snack instead of a sugar snack.  Good oils are olive oil, canola oil. Try to minimize saturated fats (solid fats): For example butter. Non fat milk is better alternative to whole milk or 2% milk.        Gradually increase exercise to walk one half hour per day.       No smoking       Limit alcohol intake    - Please schedule your tests (lipids, CK, TSH, Vit D)

## 2016-01-29 NOTE — Progress Notes (Signed)
[facility] Consultation      Referring Physician: Rennis Petty, MD    I had the pleasure of seeing [name] [name] today for chest discomfort.  She is a 60 y.o. with a history of hypertension, nonobstructive coronary artery disease, left ventricular hypertrophy..    Woke up suddenly at 2am  8 days ago with chest discomfort radiating to jaws bilaterally. The discomfort lasted 15 min. No N/V or diaphoresis. This also occurred the following day while she was sitting at her computer. The symptoms dissipated on their own. Does not know of any precipitating events. This does not occur with exertion and has not reoccurred since then. No SOB/palpitations/dizziness while this occurred. These are new since her visit with Dr. Deno Etienne.      Works at [employer] and during walks about 3 miles/day with no difficulty.  Reports having numbness in her finger tips bilaterally upon waking for the last few months. She attributes this to he position while sleeping. When she eats spicy foods she gets mild indigestion that is immediately relieved with antacids. No SOB, palpitations. Reports lightheadedness that lasts a few minutes and has been occuring for the past few years. No associated symptoms such as palpitations, CP or SOB. No reported precipitating events. She admits to snoring at night. At home her BP range 130-140 systolic. She does not check regularly.     PAST MEDICAL HISTORY:   Past Medical History   Diagnosis Date   . Left ventricular hypertrophy 2010     s/p echo   . Coronary artery disease, non-occlusive 2010     nonobstructive s/p cath 2010   . Hypertensive disorder 2004   . Pap smear for cervical cancer screening 07/25/2101     no hx of abnormal pap smear   . H/O mammogram 03/15/2102      normal    . Fibroid, uterine 05/2012   . Ovarian cyst, right 05/2012   . H/O echocardiogram 06/28/13     EF 65%, mild LVH       PAST SURGICAL HISTORY:   Past Surgical History   Procedure Laterality Date   . Cardiac  catheterization  2010     non-obs CAD   . Tubal ligation  1984   . Inguinal hernia repair  2004     right   . Myomectomy  04/2014       MEDICATIONS:   Current Outpatient Prescriptions   Medication Sig   . Calcium-Magnesium 500-250 MG Tab Take 1 tablet by mouth daily.   . carvedilol (COREG) 25 MG tablet Take 2 tablets (50 mg total) by mouth 2 (two) times daily with meals.   . Cholecalciferol (VITAMIN D) 2000 UNITS Cap Take 2,000 Unit by mouth daily.   . SOY ISOFLAVONE PO Take 250 mg by mouth daily.   Marland Kitchen spironolactone (ALDACTONE) 50 MG tablet Take 1 tablet (50 mg total) by mouth daily.   . valsartan-hydroCHLOROthiazide (DIOVAN-HCT) 320-12.5 MG per tablet Take 1 tablet by mouth daily.       ALLERGIES:   Allergies   Allergen Reactions   . Aspirin Shortness Of Breath and Rash   . Levaquin [Levofloxacin Hemihydrate]      Joint pains   . Sudafed [Pseudoephedrine] Swelling   . Tetanus Immune Globulin      Skin reaction        FAMILY HISTORY: Her family history includes Bone cancer in her mother; Breast cancer (age of onset: 53) in her mother; Heart failure  in her brother and sister. Family history of pre-mature coronary hearst disease.    SOCIAL HISTORY: She reports that she has never smoked. She has never used smokeless tobacco. She reports that she drinks alcohol. She reports that she does not use illicit drugs.    REVIEW OF SYSTEMS: All other systems reviewed and were negative except as stated above.     PHYSICAL EXAMINATION  General Appearance:  A well-appearing female in no acute distress.    Vital Signs: BP 140/80 mmHg  Pulse 58  Ht 1.626 m (5\' 4" )  Wt 98.884 kg (218 lb)  BMI 37.40 kg/m2   HEENT: Sclera anicteric, conjunctiva without pallor, moist mucous membranes, normal dentition. No arcus.   Neck:  Supple without jugular venous distention. Thyroid nonpalpable. Normal carotid upstroke without bruits.   Chest: Clear to auscultation bilaterally with good air movement and respiratory effort and no wheezes, rales,  or rhonchi   Cardiovascular: Normal S1 and physiologically split S2 without murmurs, gallops or rub. PMI of normal size and nondisplaced.   Abdomen: Soft, nontender, nondistended, with normoactive bowel sounds. No organomegaly.  No pulsatile masses, or bruits.   Extremities: Warm without edema, clubbing, or cyanosis. All peripheral pulses are full and equal.   Skin: No rash, xanthoma or xanthelasma.   Neuro: Alert and oriented x3. Grossly intact. Strength is symmetrical. Normal mood and affect.     ECG: Sinus rhythm, January 29, 2016, normal EKG no significant change from prior    LABS:   Lab Results   Component Value Date    CHOL 161 11/18/2014    LDL 112* 11/18/2014    HDL 36* 11/18/2014    TRIG 63 11/18/2014    NA 142 11/18/2014    K 4.6 11/18/2014    CL 108 11/18/2014    CO2 26 11/18/2014    BUN 22.0* 11/18/2014    CREAT 1.1 11/18/2014           Name MRN Description     [name] [name] [id number] 60 year old Female         [facility]  [address]  [address] [[email or web address]  [place] [address]  [phone]    Location: [facility]   [facility]  Patient Name: [name] [name], [name] [name] MRN: [id number] Date of 04/21/2015  Referring Edman Circle DOB: 04/01/1956 Service:  Physician: Gender: F Height: 64.0 in  Interpreting Edman Circle Age: 74 Weight: 214 lb  Physician: BP: 120/80 BSA: 2.09 m2  Sonographer: April Negley, RDCS  Indication: Essential   hypertension  Study Quality: Technically limited secondary to patient body habitus.    Referring Physician: [facility] Primary Care Physician: Lambert Keto    --------------------------------------------------------------------------    Echocardiogram Report  Echocardiogram was performed conforming to the American Society of  Echocardiography protocols for a complete study using  2D/M-Mode, spectral  and color flow Doppler modalities in obtaining imaging, hemodynamics,  measurements, and calculations.      Conclusions: 1. Technically difficult study.     2. Moderate concentric left ventricular hypertrophy.     3. Estimated ejection fraction 65%     4. No regional wall motion abnormalities noted.     5. No significant valvular disease.  Findings:  Left Ventricle: Normal left ventricular size. Moderate concentric left   ventricular hypertrophy. Normal left ventricular systolic   function. Estimated ejection fraction 65% No regional   wall motion abnormalities noted. Grade I left ventricular   diastolic dysfunction.  Right Ventricle: The right  ventricle is not well visualized.  Left Atrium: Normal left atrial size.  Right Atrium: Normal right atrial size. The interatrial septum is   grossly intact.  Mitral Valve: The mitral valve is normal in structure and opens well.   The mitral valve leaflets appear normal with no evidence   for systolic anterior motion, mitral valve prolapse,   rheumatic disease or vegetation. No significant mitral   regurgitation. No mitral stenosis.  Tricuspid Valve: Normal tricuspid valve structure and function. Trace to   mild tricuspid regurgitation. Pulmonary arterial systolic   pressure is estimated to be with a right atrial   pressure of . No tricuspid stenosis.  Aortic Valve: The aortic valve is trileaflet with normal leaflet   mobility. No significant aortic regurgitation. No aortic   stenosis.  Pulmonic Valve: Structurally normal pulmonic valve with normal function.   Trace pulmonic regurgitation. No pulmonic stenosis.  Aorta: Aortic root  and proximal ascending aorta are normal in   size.  Pericardium: No pericardial effusion.  IVC: The inferior vena cava is normal in size with normal   respirophasic variation.    Electronically signed Edman Circle 04/23/15 4:32 PM         IMPRESSION/RECOMMENDATIONS: Ms. Faraci is a 60 y.o. female     Encounter Diagnoses   Name Primary?   . Coronary artery disease involving native coronary artery of native heart without angina pectoris Yes   . Essential hypertension    . Impaired fasting glucose    . Chest pain, atypical    . Needs sleep apnea assessment    . Snoring       [name] [name] presents for cardiology consultation.  The patient comes in today  she had 2 episodes where she woke up with chest discomfort radiating to her jaw bilaterally, similar to what she had several years ago when she had nonobstructive disease by cath.  Since this episode she exerted herself working at [employer] without any further discomfort.  My suspicion is low that this is significant angina.  Given the lack of exertional symptoms since her episode week ago.  One episode did occur and wake her up at night and with her obesity.  She may have sleep apnea.  Her physical exam is significant for an early peaking crescendo decrescendo systolic murmur 2nd right intercostal space and good equal pulses bilaterally.  Recommendations:  1.  EKG treadmill stress test as week  2.  Echocardiogram.  3.  Referral for sleep apnea evaluation.  4.  Patient to go to ER, symptoms recur and do not resolve.  5.  Follow-up 6 weeks

## 2016-02-02 ENCOUNTER — Encounter (INDEPENDENT_AMBULATORY_CARE_PROVIDER_SITE_OTHER): Payer: Self-pay | Admitting: Cardiovascular Disease

## 2016-02-12 ENCOUNTER — Other Ambulatory Visit (INDEPENDENT_AMBULATORY_CARE_PROVIDER_SITE_OTHER): Payer: Self-pay | Admitting: Cardiovascular Disease

## 2016-02-12 DIAGNOSIS — I10 Essential (primary) hypertension: Secondary | ICD-10-CM

## 2016-02-12 MED ORDER — CARVEDILOL 25 MG PO TABS
50.0000 mg | ORAL_TABLET | Freq: Two times a day (BID) | ORAL | Status: AC
Start: 2016-02-12 — End: ?

## 2016-02-14 DIAGNOSIS — L669 Cicatricial alopecia, unspecified: Secondary | ICD-10-CM | POA: Diagnosis not present

## 2016-02-27 ENCOUNTER — Encounter (INDEPENDENT_AMBULATORY_CARE_PROVIDER_SITE_OTHER): Payer: Self-pay | Admitting: Cardiovascular Disease

## 2016-02-27 NOTE — Progress Notes (Signed)
Let telephone message for patient to call me back

## 2016-03-01 ENCOUNTER — Encounter (INDEPENDENT_AMBULATORY_CARE_PROVIDER_SITE_OTHER): Payer: Commercial Managed Care - POS | Admitting: Nurse Practitioner

## 2016-03-01 ENCOUNTER — Ambulatory Visit: Payer: Commercial Managed Care - POS

## 2016-04-10 DIAGNOSIS — L669 Cicatricial alopecia, unspecified: Secondary | ICD-10-CM | POA: Diagnosis not present

## 2016-04-10 DIAGNOSIS — Z23 Encounter for immunization: Secondary | ICD-10-CM | POA: Diagnosis not present

## 2016-05-13 DIAGNOSIS — Z1231 Encounter for screening mammogram for malignant neoplasm of breast: Secondary | ICD-10-CM | POA: Diagnosis not present

## 2016-05-14 DIAGNOSIS — R102 Pelvic and perineal pain: Secondary | ICD-10-CM | POA: Diagnosis not present

## 2016-05-15 DIAGNOSIS — R102 Pelvic and perineal pain: Secondary | ICD-10-CM | POA: Diagnosis not present

## 2016-05-15 DIAGNOSIS — N83202 Unspecified ovarian cyst, left side: Secondary | ICD-10-CM | POA: Diagnosis not present

## 2016-05-28 DIAGNOSIS — L669 Cicatricial alopecia, unspecified: Secondary | ICD-10-CM | POA: Diagnosis not present

## 2016-05-28 DIAGNOSIS — L820 Inflamed seborrheic keratosis: Secondary | ICD-10-CM | POA: Diagnosis not present

## 2016-06-24 DIAGNOSIS — N83202 Unspecified ovarian cyst, left side: Secondary | ICD-10-CM | POA: Diagnosis not present

## 2016-08-08 DIAGNOSIS — L821 Other seborrheic keratosis: Secondary | ICD-10-CM | POA: Diagnosis not present

## 2016-08-08 DIAGNOSIS — S01312A Laceration without foreign body of left ear, initial encounter: Secondary | ICD-10-CM | POA: Diagnosis not present

## 2016-08-08 DIAGNOSIS — L669 Cicatricial alopecia, unspecified: Secondary | ICD-10-CM | POA: Diagnosis not present

## 2016-08-19 ENCOUNTER — Encounter (INDEPENDENT_AMBULATORY_CARE_PROVIDER_SITE_OTHER): Payer: Commercial Managed Care - POS

## 2016-08-19 ENCOUNTER — Encounter (INDEPENDENT_AMBULATORY_CARE_PROVIDER_SITE_OTHER): Payer: Self-pay

## 2016-09-16 NOTE — Progress Notes (Deleted)
      HPI:   [name] is a 61 y.o. female, who is here today to establish care with me.  Former PCP: Dr Leslie Boyer Last preventive routine visit: 01/2015.  Chronic medical problems: ***   Hyperlipidemia:  Currently on Lipitor 20 mg daily. Following a low fat diet: ***.  She has not noted side effects with medication.  Lab Results  Component Value Date   CHOL 293 (H) 01/25/2015   HDL 74.80 01/25/2015   LDLCALC 204 (H) 01/25/2015   LDLDIRECT 224.3 06/08/2007   TRIG 70.0 01/25/2015   CHOLHDL 4 01/25/2015      Concerns today: ***     Review of Systems    Current Outpatient Prescriptions on File Prior to Visit  Medication Sig Dispense Refill  . atorvastatin (LIPITOR) 20 MG tablet Take 1 tablet (20 mg total) by mouth daily. 90 tablet 3   No current facility-administered medications on file prior to visit.      Past Medical History:  Diagnosis Date  . 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 History of tobacco abuse   . Hyperlipidemia   . Osteopenia    Allergies  Allergen Reactions  . Sulfonamide Derivatives     REACTION: Hives  . Penicillins Rash    Family History  Problem Relation Age of Onset  . Ovarian cancer Mother     2008  . Osteoporosis Mother     Social History   Social History  . Marital status: Divorced    Spouse name: N/A  . Number of children: N/A  . Years of education: N/A   Social History Main Topics  . Smoking status: Former Research scientist (life sciences)  . Smokeless tobacco: Never Used  . Alcohol use No  . Drug use: No  . Sexual activity: Yes    Birth control/ protection: Surgical   Other Topics Concern  . Not on file   Social History Narrative   Former Smoker   Alcohol use-no     Occupation:  [employer]     3 daughters   1 son   Divorced  (husband had drug problem)     There were no vitals filed for this visit.  There is no height or weight on file to calculate BMI.      Physical  Exam    ASSESSMENT AND PLAN:     Diagnoses and all orders for this visit:  Vitamin D deficiency                Leslie Boyer G. Martinique, MD  [facility]. [place] office.

## 2016-09-17 ENCOUNTER — Ambulatory Visit: Payer: Self-pay | Admitting: Family Medicine

## 2016-09-17 NOTE — Progress Notes (Signed)
HPI:   [name] is a 61 y.o. female, who is here today to establish care.  Former PCP: Dr Shawna Orleans. Last CPE 01/2015.   Hyperlipidemia:  Currently she is on non pharmacologics treatment, she was on Lipitor 10 mg daily. Following a low fat diet: No.  She had not noted side effects with medication.  Lab Results  Component Value Date   CHOL 293 (H) 01/25/2015   HDL 74.80 01/25/2015   LDLCALC 204 (H) 01/25/2015   LDLDIRECT 224.3 06/08/2007   TRIG 70.0 01/25/2015   CHOLHDL 4 01/25/2015   She states that she is eating out frequently, eats fried food frequently. She is not exercising regularly.   Concerns today:  -For the past 2-3 weeks she has had burning on plantar aspect of foot,bilateral,mainly on heels. Moderate in intensity. Constant,worse at night when she is in bed,sometimes it keeps her from sleep. She is not sure about exacerbating or relieving factors. Also mentions that her left foot has a "sensitive" area, no soreness or pain but feels like she had stepped on something. Right ankle pain when standing up, no edema or erythema.It seems to get better after a few steps.   -She is also c/o bilateral calf cramps. She has had this problem for years,intermittently. She states that she has had studies done,reported as negative. She has not identified exacerbating or aggravating factors, it happens day and night.  She denies fever,chills,night sweats, abnormal wt loss, focal weakness, headache, or numbness/tingling.   She has not tried OTC medication.   -Also on problem list vit D deficiency. She is not on vit D supplementation.   Review of Systems  Constitutional: Negative for activity change, appetite change, fatigue, fever and unexpected weight change.  HENT: Negative for mouth sores, nosebleeds and trouble swallowing.   Eyes: Negative for redness and visual disturbance.  Respiratory: Negative for cough, shortness of breath and wheezing.     Cardiovascular: Negative for chest pain, palpitations and leg swelling.  Gastrointestinal: Negative for abdominal pain, nausea and vomiting.       Negative for changes in bowel habits.  Endocrine: Negative for cold intolerance, heat intolerance, polydipsia, polyphagia and polyuria.  Genitourinary: Negative for decreased urine volume, difficulty urinating, dysuria and hematuria.  Musculoskeletal: Positive for arthralgias and myalgias. Negative for back pain and gait problem.  Skin: Negative for rash.  Neurological: Negative for seizures, syncope, weakness and headaches.  Hematological: Negative for adenopathy. Does not bruise/bleed easily.  Psychiatric/Behavioral: Positive for sleep disturbance. Negative for confusion. The patient is not nervous/anxious.       No current outpatient prescriptions on file prior to visit.   No current facility-administered medications on file prior to visit.      Past Medical History:  Diagnosis Date  . 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 History of tobacco abuse   . Hyperlipidemia   . Osteopenia    Allergies  Allergen Reactions  . Sulfonamide Derivatives     REACTION: Hives  . Penicillins Rash    Social History   Social History  . Marital status: Divorced    Spouse name: N/A  . Number of children: N/A  . Years of education: N/A   Social History Main Topics  . Smoking status: Former Research scientist (life sciences)  . Smokeless tobacco: Never Used  . Alcohol use No  . Drug use: No  . Sexual activity: Yes    Birth control/ protection: Surgical   Other Topics  Concern  . None   Social History Narrative   Former Smoker   Alcohol use-no     Occupation:  [employer]     3 daughters   1 son   Divorced  (husband had drug problem)     Vitals:   09/18/16 1030  BP: 120/70  Pulse: 71  Resp: 12   Body mass index is 24.67 kg/m.   Physical Exam  Nursing note and vitals reviewed. Constitutional: She is oriented to person, place,  and time. She appears well-developed and well-nourished. No distress.  HENT:  Head: Atraumatic.  Mouth/Throat: Oropharynx is clear and moist and mucous membranes are normal. She has dentures.  Eyes: Conjunctivae and EOM are normal. Pupils are equal, round, and reactive to light.  Neck: No tracheal deviation present. No thyroid mass and no thyromegaly present.  Cardiovascular: Normal rate and regular rhythm.   No murmur heard. Pulses:      Dorsalis pedis pulses are 2+ on the right side, and 2+ on the left side.  Respiratory: Effort normal and breath sounds normal. No respiratory distress.  GI: Soft. She exhibits no mass. There is no hepatomegaly. There is no tenderness.  Musculoskeletal: She exhibits no edema or tenderness.  Lymphadenopathy:    She has no cervical adenopathy.  Neurological: She is alert and oriented to person, place, and time. She has normal strength. No cranial nerve deficit or sensory deficit. Coordination and gait normal.  Foot monofilament normal bilateral.  Skin: Skin is warm. No rash noted. No erythema.  Psychiatric: Her affect is blunt.  Well groomed, good eye contact.      ASSESSMENT AND PLAN:   Leslie Boyer was seen today for establish care.  Diagnoses and all orders for this visit:   Burning sensation of foot  Reviewing records she has had work-up for paresthesia of skin, 04/15/11: B12 and cbc,otherwise normal except for mild elevated platelets.   We discussed possible etiologies. Neurologic examination today normal. Also discussed some treatment options, Gabapentin or Cymbalta, which we can consider in the future if needed. EMG to be consider of problem persists. Instructed about warning signs.  -     Vitamin B12; Future -     CBC; Future -     Hemoglobin A1c; Future -     Lipid panel; Future -     RPR; Future -     TSH; Future -     Comprehensive metabolic panel; Future  Hyperlipidemia, unspecified hyperlipidemia type  Further  recommendations will be given according to labs results. Adverse effects of HLD discussed as well as other risk factors for CVD. Low fat diet discussed. Daily Aspirin 81 mg daily recommended.   -     Lipid panel; Future -     Comprehensive metabolic panel; Future  Vitamin D deficiency  Further recommendations will be given according to lab result.   -     VITAMIN D 25 Hydroxy (Vit-D Deficiency, Fractures); Future  Bilateral leg cramps  Chronic. Possible causes discussed. Gabapentin may help. She prefers to hold on pharmacologic treatment.   -     TSH; Future -     Comprehensive metabolic panel; Future     -Ms. Leslie Boyer was advised to return sooner than planned today if new concerns arise.       Leslie Blinder G. Martinique, MD  [facility]. [place] office.

## 2016-09-18 ENCOUNTER — Encounter: Payer: Self-pay | Admitting: Family Medicine

## 2016-09-18 ENCOUNTER — Ambulatory Visit (INDEPENDENT_AMBULATORY_CARE_PROVIDER_SITE_OTHER): Payer: BLUE CROSS/BLUE SHIELD | Admitting: Family Medicine

## 2016-09-18 VITALS — BP 120/70 | HR 71 | Resp 12 | Ht 63.5 in | Wt 141.5 lb

## 2016-09-18 DIAGNOSIS — R208 Other disturbances of skin sensation: Secondary | ICD-10-CM | POA: Diagnosis not present

## 2016-09-18 DIAGNOSIS — E559 Vitamin D deficiency, unspecified: Secondary | ICD-10-CM

## 2016-09-18 DIAGNOSIS — E785 Hyperlipidemia, unspecified: Secondary | ICD-10-CM | POA: Diagnosis not present

## 2016-09-18 DIAGNOSIS — R252 Cramp and spasm: Secondary | ICD-10-CM | POA: Diagnosis not present

## 2016-09-18 NOTE — Patient Instructions (Signed)
A few things to remember from today's visit:   Hyperlipidemia, unspecified hyperlipidemia type - Plan: Lipid panel, Comprehensive metabolic panel  Burning sensation of foot - Plan: Vitamin B12, CBC, Hemoglobin A1c, Lipid panel, RPR, TSH, Comprehensive metabolic panel   We have ordered labs or studies at this visit.  It can take up to 1-2 weeks for results and processing. IF results require follow up or explanation, we will call you with instructions. Clinically stable results will be released to your [facility]. If you have not heard from [place] or cannot find your results in [facility] in 2 weeks please contact our office at [phone].  If you are not yet signed up for [facility], please consider signing up  Please be sure medication list is accurate. If a new problem present, please set up appointment sooner than planned today.

## 2016-09-18 NOTE — Progress Notes (Signed)
Pre visit review using our clinic review tool, if applicable. No additional management support is needed unless otherwise documented below in the visit note. 

## 2016-09-19 ENCOUNTER — Other Ambulatory Visit (INDEPENDENT_AMBULATORY_CARE_PROVIDER_SITE_OTHER): Payer: BLUE CROSS/BLUE SHIELD

## 2016-09-19 DIAGNOSIS — R208 Other disturbances of skin sensation: Secondary | ICD-10-CM | POA: Diagnosis not present

## 2016-09-19 DIAGNOSIS — R252 Cramp and spasm: Secondary | ICD-10-CM

## 2016-09-19 DIAGNOSIS — E559 Vitamin D deficiency, unspecified: Secondary | ICD-10-CM | POA: Diagnosis not present

## 2016-09-19 DIAGNOSIS — E785 Hyperlipidemia, unspecified: Secondary | ICD-10-CM | POA: Diagnosis not present

## 2016-09-19 LAB — CBC
HEMATOCRIT: 36.8 % (ref 36.0–46.0)
Hemoglobin: 12.5 g/dL (ref 12.0–15.0)
MCHC: 34 g/dL (ref 30.0–36.0)
MCV: 91.6 fl (ref 78.0–100.0)
Platelets: 289 10*3/uL (ref 150.0–400.0)
RBC: 4.02 Mil/uL (ref 3.87–5.11)
RDW: 14 % (ref 11.5–15.5)
WBC: 3.2 10*3/uL — ABNORMAL LOW (ref 4.0–10.5)

## 2016-09-19 LAB — COMPREHENSIVE METABOLIC PANEL
ALT: 9 U/L (ref 0–35)
AST: 13 U/L (ref 0–37)
Albumin: 4 g/dL (ref 3.5–5.2)
Alkaline Phosphatase: 52 U/L (ref 39–117)
BILIRUBIN TOTAL: 0.5 mg/dL (ref 0.2–1.2)
BUN: 15 mg/dL (ref 6–23)
CHLORIDE: 103 meq/L (ref 96–112)
CO2: 30 meq/L (ref 19–32)
Calcium: 9.7 mg/dL (ref 8.4–10.5)
Creatinine, Ser: 0.92 mg/dL (ref 0.40–1.20)
GFR: 79.98 mL/min (ref >60.00)
GLUCOSE: 93 mg/dL (ref 70–99)
POTASSIUM: 4.3 meq/L (ref 3.5–5.1)
Sodium: 139 mEq/L (ref 135–145)
Total Protein: 6.7 g/dL (ref 6.0–8.3)

## 2016-09-19 LAB — HEMOGLOBIN A1C: HEMOGLOBIN A1C: 6.1 % (ref 4.6–6.5)

## 2016-09-19 LAB — TSH: TSH: 0.97 u[IU]/mL (ref 0.35–4.50)

## 2016-09-19 LAB — VITAMIN D 25 HYDROXY (VIT D DEFICIENCY, FRACTURES): VITD: 9.56 ng/mL — ABNORMAL LOW (ref 30.00–100.00)

## 2016-09-19 LAB — LIPID PANEL
CHOL/HDL RATIO: 6
Cholesterol: 338 mg/dL — ABNORMAL HIGH (ref 0–200)
HDL: 59.6 mg/dL (ref >39.00)
LDL CALC: 253 mg/dL — AB (ref 0–99)
NONHDL: 278.03
Triglycerides: 123 mg/dL (ref 0.0–149.0)
VLDL: 24.6 mg/dL (ref 0.0–40.0)

## 2016-09-19 LAB — VITAMIN B12: Vitamin B-12: 346 pg/mL (ref 211–911)

## 2016-09-20 ENCOUNTER — Other Ambulatory Visit: Payer: Self-pay

## 2016-09-20 LAB — RPR

## 2016-09-20 MED ORDER — ATORVASTATIN CALCIUM 20 MG PO TABS: 20.0000 mg | ORAL_TABLET | Freq: Every day | ORAL | 5 refills | Status: DC

## 2016-09-20 MED ORDER — VITAMIN D (ERGOCALCIFEROL) 1.25 MG (50000 UNIT) PO CAPS: ORAL_CAPSULE | ORAL | 4 refills | Status: DC

## 2016-11-19 ENCOUNTER — Ambulatory Visit: Payer: BLUE CROSS/BLUE SHIELD | Admitting: Family Medicine

## 2016-11-20 ENCOUNTER — Ambulatory Visit (INDEPENDENT_AMBULATORY_CARE_PROVIDER_SITE_OTHER): Payer: BLUE CROSS/BLUE SHIELD | Admitting: Family Medicine

## 2016-11-20 ENCOUNTER — Encounter: Payer: Self-pay | Admitting: Family Medicine

## 2016-11-20 VITALS — BP 110/72 | HR 71 | Resp 12 | Ht 63.5 in | Wt 141.2 lb

## 2016-11-20 DIAGNOSIS — S301XXA Contusion of abdominal wall, initial encounter: Secondary | ICD-10-CM

## 2016-11-20 DIAGNOSIS — L810 Postinflammatory hyperpigmentation: Secondary | ICD-10-CM | POA: Diagnosis not present

## 2016-11-20 NOTE — Progress Notes (Signed)
HPI:   ACUTE VISIT:  Chief Complaint  Patient presents with  . bruising marks    [name] is a 61 y.o. female, who is here today complaining of easy bruising. According to patient, this has happened for years, usually after trauma but her boyfriend was concerned and recommended for her to be evaluated. Some lesions due to "not clear up", particularly those on lower and upper extremities.She states that if she gets any on trunk on back they usually resolved in a few weeks.  Last one she noted is on right side of waist after fall at church 2 days ago, landed on a chair.  Occasionally she has mild epistaxis when blowing her nose or associated with hot weather, it is mild and self-limited. She denies fever, night sweats, gum bleeding, blood in the stool, gross hematuria, enlarged glands, spontaneous ecchymosis/hematomas, or abnormal weight loss.  She had heavy menses with "clots" and dysmenorrhea. She denies any family history of coagulopathies.   Brother with history of recurrent DVT, he is on Coumadin.  Lab Results  Component Value Date   WBC 3.2 (L) 09/19/2016   HGB 12.5 09/19/2016   HCT 36.8 09/19/2016   MCV 91.6 09/19/2016   PLT 289.0 09/19/2016   Lab Results  Component Value Date   TSH 0.97 09/19/2016     Review of Systems  Constitutional: Negative for fatigue, fever and unexpected weight change.  HENT: Positive for nosebleeds. Negative for ear discharge, mouth sores, sore throat and trouble swallowing.   Eyes: Negative for discharge and redness.  Respiratory: Negative for cough, shortness of breath and wheezing.   Cardiovascular: Negative for palpitations and leg swelling.  Gastrointestinal: Negative for abdominal pain, blood in stool, nausea and vomiting.       Changes in bowel habits.  Endocrine: Negative for cold intolerance and heat intolerance.  Genitourinary: Negative for decreased urine volume, hematuria and vaginal bleeding.    Musculoskeletal: Negative for gait problem and myalgias.  Skin: Negative for rash and wound.  Neurological: Negative for syncope, weakness and headaches.  Hematological: Negative for adenopathy. Bruises/bleeds easily.     Current Outpatient Prescriptions on File Prior to Visit  Medication Sig Dispense Refill  . atorvastatin (LIPITOR) 20 MG tablet Take 1 tablet (20 mg total) by mouth daily. 30 tablet 5  . Vitamin D, Ergocalciferol, (DRISDOL) 50000 units CAPS capsule Take 1 capsule by mouth once a week for 16 weeks, then every 2 weeks. 4 capsule 4   No current facility-administered medications on file prior to visit.     Past Medical History:  Diagnosis Date  . 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 History of tobacco abuse   . Hyperlipidemia   . Osteopenia    Allergies  Allergen Reactions  . Sulfonamide Derivatives     REACTION: Hives  . Penicillins Rash    Social History   Social History  . Marital status: Divorced    Spouse name: N/A  . Number of children: N/A  . Years of education: N/A   Social History Main Topics  . Smoking status: Former Research scientist (life sciences)  . Smokeless tobacco: Never Used  . Alcohol use No  . Drug use: No  . Sexual activity: Yes    Birth control/ protection: Surgical   Other Topics Concern  . None   Social History Narrative   Former Smoker   Alcohol use-no     Occupation:  Merchandiser, retail of [place]  3 daughters   1 son   Divorced  (husband had drug problem)     Vitals:   11/20/16 1023  BP: 110/72  Pulse: 71  Resp: 12  O2 sat 95% at RA Body mass index is 24.63 kg/m.  Physical Exam  Nursing note and vitals reviewed. Constitutional: She is oriented to person, place, and time. She appears well-developed and well-nourished. No distress.  HENT:  Head: Atraumatic.  Mouth/Throat: Oropharynx is clear and moist and mucous membranes are normal.  Eyes: Conjunctivae and EOM are normal.  Cardiovascular: Normal rate and regular rhythm.    No murmur heard. Respiratory: Effort normal and breath sounds normal. No respiratory distress.  GI: Soft. She exhibits no mass. There is no hepatosplenomegaly. There is no tenderness.  Musculoskeletal: She exhibits no edema.  Lymphadenopathy:       Head (right side): No submental, no submandibular and no occipital adenopathy present.       Head (left side): No submental, no submandibular and no occipital adenopathy present.    She has no cervical adenopathy.       Right: No supraclavicular adenopathy present.       Left: No supraclavicular adenopathy present.  Neurological: She is alert and oriented to person, place, and time.  Skin: Skin is warm. Ecchymosis noted. No petechiae and no rash noted. No erythema.     Ecchymotic lesions,rounded ,about 2 cm under left knee. Right side of waist: about 13-15 cm max diameter, ecchymosis,no hematoma palpated, mildly tender, no deformity. On upper extremities (forearms mainly, R>L) hyperpigmented macular lesions, also noted on pretibial areas bilateral.  Psychiatric: She has a normal mood and affect. Her speech is normal.  Well groomed, good eye contact.      ASSESSMENT AND PLAN:   [name] was seen today for bruising marks.  Diagnoses and all orders for this visit:  Traumatic ecchymosis of abdominal wall, initial encounter  I do not think imaging is needed today. In general recommend trying to avoid trauma, fall prevention. Monitor and f/u as needed. Instructed about warning signs.  Postinflammatory hyperpigmentation  Explained physiopathology and the lact of effective treatments. Also gave recommendations about how she can try to prevent it: mainly sun avoidance of areas of trauma and keep area moisturized.    25 min face to face OV. > 50% was dedicated to reassuring and counseling about differential Dx including serious systemic illness (myeloproliferative) and coagulation factor deficiencies but history provided today does not  suggest a concerning process. Still the option of repeating CBC and ordering INR,PT,PTT, as well as hematology evaluation was also considered but she prefers to wait on further work up at this time. We discussed last CBC results, low WBC's noted and rest of blood cells in normal range. She has had WBC's as low as 3.2 in 10/2008, so I think we can repeat annually. Aspirin side effects discussed.     -[name] was advised to return or notify me if symptoms worsen  or new concerns arise.       Coltrane Tugwell G. Martinique, MD  [facility]. [place] office.

## 2016-11-20 NOTE — Patient Instructions (Signed)
A few things to remember from today's visit:   Traumatic ecchymosis of abdominal wall, initial encounter  Postinflammatory hyperpigmentation  Avoid sun light on bruises and avoid trauma.    Please be sure medication list is accurate. If a new problem present, please set up appointment sooner than planned today.

## 2017-01-29 DIAGNOSIS — L818 Other specified disorders of pigmentation: Secondary | ICD-10-CM | POA: Diagnosis not present

## 2017-01-29 DIAGNOSIS — L821 Other seborrheic keratosis: Secondary | ICD-10-CM | POA: Diagnosis not present

## 2017-02-03 ENCOUNTER — Other Ambulatory Visit: Payer: Self-pay | Admitting: Family Medicine

## 2017-02-10 ENCOUNTER — Other Ambulatory Visit: Payer: Self-pay | Admitting: Family Medicine

## 2017-02-11 ENCOUNTER — Ambulatory Visit: Payer: BLUE CROSS/BLUE SHIELD | Admitting: Podiatry

## 2017-02-12 ENCOUNTER — Ambulatory Visit: Payer: BLUE CROSS/BLUE SHIELD

## 2017-02-12 ENCOUNTER — Ambulatory Visit (INDEPENDENT_AMBULATORY_CARE_PROVIDER_SITE_OTHER): Payer: BLUE CROSS/BLUE SHIELD

## 2017-02-12 ENCOUNTER — Ambulatory Visit (INDEPENDENT_AMBULATORY_CARE_PROVIDER_SITE_OTHER): Payer: BLUE CROSS/BLUE SHIELD | Admitting: Podiatry

## 2017-02-12 ENCOUNTER — Encounter: Payer: Self-pay | Admitting: Podiatry

## 2017-02-12 ENCOUNTER — Other Ambulatory Visit: Payer: Self-pay | Admitting: Podiatry

## 2017-02-12 VITALS — BP 101/65 | HR 60 | Resp 16 | Ht 64.0 in | Wt 140.0 lb

## 2017-02-12 DIAGNOSIS — M779 Enthesopathy, unspecified: Secondary | ICD-10-CM | POA: Diagnosis not present

## 2017-02-12 DIAGNOSIS — M79672 Pain in left foot: Secondary | ICD-10-CM

## 2017-02-12 DIAGNOSIS — D3610 Benign neoplasm of peripheral nerves and autonomic nervous system, unspecified: Secondary | ICD-10-CM | POA: Diagnosis not present

## 2017-02-12 MED ORDER — DICLOFENAC SODIUM 75 MG PO TBEC: 75.0000 mg | DELAYED_RELEASE_TABLET | Freq: Two times a day (BID) | ORAL | 2 refills | Status: DC

## 2017-02-12 NOTE — Progress Notes (Signed)
Subjective:    Patient ID: Leslie Boyer, female   DOB: 61 y.o.   MRN: [id number]   HPI patient presents stating she's been getting some pain in the left forefoot of the 1-2 months duration and states it bothers her when she's on it for too long or at times when she first gets up on it. Does not remember injury    Review of Systems  All other systems reviewed and are negative.       Objective:  Physical Exam  Cardiovascular: Intact distal pulses.   Musculoskeletal: Normal range of motion.  Neurological: She is alert.  Skin: Skin is warm.  Nursing note and vitals reviewed.  neurovascular status was found to be intact muscle strength was adequate range of motion within normal limits with patient noted to have inflammatory changes around the second MPJ left and also mild neuroma symptomatology left with positive Biagio Borg sign of a mild nature. 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 At this point we will utilize padding rigid bottom shoes without flexibility and also oral anti-inflammatories diclofenac 75 mg twice a day. Patient be seen back to recheck and if symptoms persist we'll need to consider cortisone aspiration injection  X-rays indicate that there is no indications of arthritis or stress fracture

## 2017-02-12 NOTE — Progress Notes (Signed)
   Subjective:    Patient ID: Leslie Boyer, female    DOB: [birth year], 61 y.o.   MRN: [id number]  HPI Chief Complaint  Patient presents with  . Foot Pain    Left foot; plantar forefoot; pt stated, "Feels like walking on something for the past 1-2 months"      Review of Systems  All other systems reviewed and are negative.      Objective:   Physical Exam        Assessment & Plan:

## 2017-02-25 DIAGNOSIS — Z6823 Body mass index (BMI) 23.0-23.9, adult: Secondary | ICD-10-CM | POA: Diagnosis not present

## 2017-02-25 DIAGNOSIS — Z01419 Encounter for gynecological examination (general) (routine) without abnormal findings: Secondary | ICD-10-CM | POA: Diagnosis not present

## 2017-03-08 ENCOUNTER — Emergency Department (HOSPITAL_BASED_OUTPATIENT_CLINIC_OR_DEPARTMENT_OTHER): Payer: BLUE CROSS/BLUE SHIELD

## 2017-03-08 ENCOUNTER — Emergency Department (HOSPITAL_BASED_OUTPATIENT_CLINIC_OR_DEPARTMENT_OTHER)
Admission: EM | Admit: 2017-03-08 | Discharge: 2017-03-09 | Disposition: A | Discharge status: Home / Self Care | Payer: BLUE CROSS/BLUE SHIELD | Attending: Emergency Medicine | Admitting: Emergency Medicine

## 2017-03-08 ENCOUNTER — Encounter (HOSPITAL_BASED_OUTPATIENT_CLINIC_OR_DEPARTMENT_OTHER): Payer: Self-pay | Admitting: Emergency Medicine

## 2017-03-08 DIAGNOSIS — R079 Chest pain, unspecified: Secondary | ICD-10-CM | POA: Insufficient documentation

## 2017-03-08 DIAGNOSIS — Z87891 Personal history of nicotine dependence: Secondary | ICD-10-CM | POA: Insufficient documentation

## 2017-03-08 DIAGNOSIS — Z79899 Other long term (current) drug therapy: Secondary | ICD-10-CM | POA: Diagnosis not present

## 2017-03-08 DIAGNOSIS — R0789 Other chest pain: Secondary | ICD-10-CM | POA: Diagnosis not present

## 2017-03-08 LAB — BASIC METABOLIC PANEL
Anion gap: 8 (ref 5–15)
BUN: 13 mg/dL (ref 6–20)
CO2: 27 mmol/L (ref 22–32)
CREATININE: 0.91 mg/dL (ref 0.44–1.00)
Calcium: 9.1 mg/dL (ref 8.9–10.3)
Chloride: 101 mmol/L (ref 101–111)
GFR calc Af Amer: >60 mL/min (ref >60)
GLUCOSE: 142 mg/dL — AB (ref 65–99)
Potassium: 3.7 mmol/L (ref 3.5–5.1)
SODIUM: 136 mmol/L (ref 135–145)

## 2017-03-08 LAB — CBC
HCT: 36.7 % (ref 36.0–46.0)
Hemoglobin: 12.4 g/dL (ref 12.0–15.0)
MCH: 30.9 pg (ref 26.0–34.0)
MCHC: 33.8 g/dL (ref 30.0–36.0)
MCV: 91.5 fL (ref 78.0–100.0)
PLATELETS: 282 10*3/uL (ref 150–400)
RBC: 4.01 MIL/uL (ref 3.87–5.11)
RDW: 13.9 % (ref 11.5–15.5)
WBC: 5.8 10*3/uL (ref 4.0–10.5)

## 2017-03-08 LAB — D-DIMER, QUANTITATIVE: D-Dimer, Quant: 0.6 ug/mL-FEU — ABNORMAL HIGH (ref 0.00–0.50)

## 2017-03-08 LAB — TROPONIN I

## 2017-03-08 MED ORDER — KETOROLAC TROMETHAMINE 30 MG/ML IJ SOLN
30.0000 mg | Freq: Once | INTRAMUSCULAR | Status: AC
  Administered 2017-03-08: 30 mg via INTRAMUSCULAR
  Filled 2017-03-08: qty 1

## 2017-03-08 MED ORDER — IOPAMIDOL (ISOVUE-370) INJECTION 76%
100.0000 mL | Freq: Once | INTRAVENOUS | Status: AC | PRN
  Administered 2017-03-08: 100 mL via INTRAVENOUS

## 2017-03-08 MED ORDER — GI COCKTAIL ~~LOC~~
30.0000 mL | Freq: Once | ORAL | Status: AC
  Administered 2017-03-08: 30 mL via ORAL
  Filled 2017-03-08: qty 30

## 2017-03-08 MED ORDER — IBUPROFEN 200 MG PO TABS: 600.0000 mg | ORAL_TABLET | Freq: Once | ORAL | Status: DC

## 2017-03-08 NOTE — ED Triage Notes (Signed)
Patient states that she woke up this am with central chest pain. The patient states that she has tried asa and her cholesterol pill to assist today  - nothing has helped

## 2017-03-08 NOTE — ED Provider Notes (Signed)
[facility]PT [facility] Provider Note   CSN: [id number] Arrival date & time: 03/08/17  2030     History   Chief Complaint Chief Complaint  Patient presents with  . Chest Pain    HPI Leslie Boyer is a 61 y.o. female.  HPI  61 y.o. female with a hx of HLD, presents to the Emergency Department today due to central chest pain this morning. States that she woke up and then felt pressure in the center of her chest. Notes pain 3/10. Has been constant since this morning with unchanged quality. No N/V. No diaphoresis. No radiation of pain. No SOB/ABD pain. Took ASA and Cholesterol medication with minimal relief. No hx same. No hx ACS. No FH. Risk Factor: HLD. No fevers. No cough/congestion. No worsening with PO intake. No recent surgeries. No recent travel. Remote hx of possible blood clot in left leg, but hx unclear as pt was not on blood thinner with diagnosis. No other symptoms noted.   Past Medical History:  Diagnosis Date  . 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 History of tobacco abuse   . Hyperlipidemia   . Osteopenia     Patient Active Problem List   Diagnosis Date Noted  . Bilateral leg cramps 09/18/2016  . Preventative health care 01/25/2015  . Varicose veins 02/07/2014  . Dyspepsia 07/27/2012  . ARM PAIN, RIGHT 02/09/2009  . Vitamin D deficiency 11/17/2008  . GALLSTONES 11/17/2008  . OVARIAN CYST, LEFT 11/17/2008  . ANEMIA 06/12/2007  . TOBACCO ABUSE 06/12/2007  . Hyperlipidemia 06/08/2007    Past Surgical History:  Procedure Laterality Date  . MULTIPLE TOOTH EXTRACTIONS  11/2011  . TOTAL ABDOMINAL HYSTERECTOMY      OB History    No data available       Home Medications    Prior to Admission medications   Medication Sig Start Date End Date Taking? Authorizing Provider  atorvastatin (LIPITOR) 20 MG tablet TAKE 1 TABLET (20 MG TOTAL) BY MOUTH DAILY. 02/10/17   Martinique, Betty G, MD  diclofenac (VOLTAREN) 75 MG EC tablet Take 1 tablet  (75 mg total) by mouth 2 (two) times daily. 02/12/17   Wallene Huh, DPM  Vitamin D, Ergocalciferol, (DRISDOL) 50000 units CAPS capsule TAKE 1 CAPSULE BY MOUTH ONCE A WEEK FOR 16 WEEKS, THEN EVERY 2 WEEKS. 02/03/17   Martinique, Betty G, MD    Family History Family History  Problem Relation Age of Onset  . Ovarian cancer Mother        2008  . Osteoporosis Mother     Social History Social History  Substance Use Topics  . Smoking status: Former Research scientist (life sciences)  . Smokeless tobacco: Never Used  . Alcohol use No     Allergies   Sulfonamide derivatives and Penicillins   Review of Systems Review of Systems ROS reviewed and all are negative for acute change except as noted in the HPI.  Physical Exam Updated Vital Signs BP (!) 146/92 (BP Location: Left Arm)   Pulse 69   Temp 98.2 F (36.8 C) (Oral)   Resp 18   Ht 5\' 3"  (1.6 m)   Wt 64.4 kg (142 lb)   SpO2 100%   BMI 25.15 kg/m   Physical Exam  Constitutional: She is oriented to person, place, and time. Vital signs are normal. She appears well-developed and well-nourished. No distress.  HENT:  Head: Normocephalic and atraumatic.  Right Ear: Hearing, tympanic membrane, external ear and ear canal  normal.  Left Ear: Hearing, tympanic membrane, external ear and ear canal normal.  Nose: Nose normal.  Mouth/Throat: Uvula is midline, oropharynx is clear and moist and mucous membranes are normal. No trismus in the jaw. No oropharyngeal exudate, posterior oropharyngeal erythema or tonsillar abscesses.  Eyes: Pupils are equal, round, and reactive to light. Conjunctivae and EOM are normal.  Neck: Normal range of motion. Neck supple. No tracheal deviation present.  Cardiovascular: Normal rate, regular rhythm, S1 normal, S2 normal, normal heart sounds, intact distal pulses and normal pulses.   Pulmonary/Chest: Effort normal and breath sounds normal. No respiratory distress. She has no decreased breath sounds. She has no wheezes. She has no rhonchi.  She has no rales.  Mild TTP along sternum. No palpable or visible deformities.   Abdominal: Normal appearance and bowel sounds are normal. There is no tenderness.  Musculoskeletal: Normal range of motion.  Neurological: She is alert and oriented to person, place, and time.  Skin: Skin is warm and dry.  Psychiatric: She has a normal mood and affect. Her speech is normal and behavior is normal. Thought content normal.  Nursing note and vitals reviewed.    ED Treatments / Results  Labs (all labs ordered are listed, but only abnormal results are displayed) Labs Reviewed  BASIC METABOLIC PANEL - Abnormal; Notable for the following:       Result Value   Glucose, Bld 142 (*)    All other components within normal limits  D-DIMER, QUANTITATIVE (NOT AT [facility]) - Abnormal; Notable for the following:    D-Dimer, Quant 0.60 (*)    All other components within normal limits  CBC  TROPONIN I  TROPONIN I    EKG  EKG Interpretation None       Radiology Dg Chest 2 View  Result Date: 03/08/2017 CLINICAL DATA:  Chest pain EXAM: CHEST  2 VIEW COMPARISON:  06/16/2009 FINDINGS: The heart size and mediastinal contours are within normal limits. Both lungs are clear. The visualized skeletal structures are unremarkable. IMPRESSION: No active cardiopulmonary disease. Electronically Signed   By: Inez Catalina M.D.   On: 03/08/2017 21:27   Ct Angio Chest Pe W And/or Wo Contrast  Result Date: 03/08/2017 CLINICAL DATA:  Chest pain EXAM: CT ANGIOGRAPHY CHEST WITH CONTRAST TECHNIQUE: Multidetector CT imaging of the chest was performed using the standard protocol during bolus administration of intravenous contrast. Multiplanar CT image reconstructions and MIPs were obtained to evaluate the vascular anatomy. CONTRAST:  100 cc Isovue 370 IV COMPARISON:  03/08/2017 chest x-ray FINDINGS: Cardiovascular: No filling defects in the pulmonary arteries to suggest pulmonary emboli. Heart is upper limits normal in size.  Aorta is normal caliber. No dissection. Mediastinum/Nodes: No mediastinal, hilar, or axillary adenopathy. Trachea and esophagus are unremarkable. Lungs/Pleura: Mild paraseptal and centrilobular emphysema. Lungs are clear. No focal airspace opacities or suspicious nodules. No effusions. Upper Abdomen: Gallstones partially imaged within the gallbladder. No acute findings. Musculoskeletal: Chest wall soft tissues are unremarkable. No acute bony abnormality. Review of the MIP images confirms the above findings. IMPRESSION: No evidence of pulmonary embolus. Cholelithiasis. Emphysema (ICD10-J43.9). Electronically Signed   By: Rolm Baptise M.D.   On: 03/08/2017 23:56    Procedures Procedures (including critical care time)  Medications Ordered in ED Medications  gi cocktail (Maalox,Lidocaine,Donnatal) (30 mLs Oral Given 03/08/17 2205)  ketorolac (TORADOL) 30 MG/ML injection 30 mg (30 mg Intramuscular Given 03/08/17 2206)  iopamidol (ISOVUE-370) 76 % injection 100 mL (100 mLs Intravenous Contrast Given 03/08/17 2340)  Initial Impression / Assessment and Plan / ED Course  I have reviewed the triage vital signs and the nursing notes.  Pertinent labs & imaging results that were available during my care of the patient were reviewed by me and considered in my medical decision making (see chart for details).  Final Clinical Impressions(s) / ED Diagnoses  {I have reviewed and evaluated the relevant laboratory values. {I have reviewed and evaluated the relevant imaging studies. {I have interpreted the relevant EKG. {I have reviewed the relevant previous healthcare records.  {I obtained HPI from historian. {Patient discussed with supervising physician.  ED Course:  Assessment: Pt is a 61 y.o. female with a hx of HLD, presents to the Emergency Department today due to central chest pain this morning. States that she woke up and then felt pressure in the center of her chest. Notes pain 3/10. Has been constant  since this morning with unchanged quality. No N/V. No diaphoresis. No radiation of pain. No SOB/ABD pain. Took ASA and Cholesterol medication with minimal relief. No hx same. No hx ACS. No FH. Risk Factor: HLD. No fevers. No cough/congestion. No worsening with PO intake. No recent surgeries. No recent travel. Remote hx of possible blood clot in left leg, but hx unclear as pt was not on blood thinner with diagnosis. On exam, pt in NAD. Nontoxic/nonseptic appearing. VSS. Afebrile. Lungs CTA. Heart RRR. Abdomen nontender soft. CBC unremarkable. BMP unremarkable. Heart Score 3. Trop negativex2. EKG unremarkable. Concern for possible PE. D Dimer elevated. CT Angio ordered and was negative. Given GI cocktail with relief of symptoms. Doubt ACS. Likely gastric etiology. Given Rx Protonix. Close follow up with PCP. Strict return precautions given. Plan is to DC home with follow up to PCP. At time of discharge, Patient is in no acute distress. Vital Signs are stable. Patient is able to ambulate. Patient able to tolerate PO.    Disposition/Plan:  DC Home Additional Verbal discharge instructions given and discussed with patient.  Pt Instructed to f/u with PCP in the next week for evaluation and treatment of symptoms. Return precautions given Pt acknowledges and agrees with plan  Supervising Physician Tegeler, Gwenyth Allegra, *  Final diagnoses:  Chest pain, unspecified type    New Prescriptions New Prescriptions   No medications on file     Shary Decamp, Hershal Coria 03/09/17 6433    Tegeler, Gwenyth Allegra, MD 03/09/17 [id number]

## 2017-03-09 LAB — TROPONIN I

## 2017-03-09 MED ORDER — PANTOPRAZOLE SODIUM 20 MG PO TBEC
20.0000 mg | DELAYED_RELEASE_TABLET | Freq: Every day | ORAL | 0 refills | Status: DC
Start: 2017-03-09 — End: 2017-03-21

## 2017-03-09 NOTE — Discharge Instructions (Signed)
Please read and follow all provided instructions.  Your diagnoses today include:  1. Chest pain, unspecified type     Tests performed today include: An EKG of your heart A chest x-ray Cardiac enzymes - a blood test for heart muscle damage Blood counts and electrolytes Vital signs. See below for your results today.   Medications prescribed:   Take any prescribed medications only as directed.  Follow-up instructions: Please follow-up with your primary care provider as soon as you can for further evaluation of your symptoms.   Return instructions:  SEEK IMMEDIATE MEDICAL ATTENTION IF: You have severe chest pain, especially if the pain is crushing or pressure-like and spreads to the arms, back, neck, or jaw, or if you have sweating, nausea (feeling sick to your stomach), or shortness of breath. THIS IS AN EMERGENCY. Don't wait to see if the pain will go away. Get medical help at once. Call 911 or 0 (operator). DO NOT drive yourself to the hospital.  Your chest pain gets worse and does not go away with rest.  You have an attack of chest pain lasting longer than usual, despite rest and treatment with the medications your caregiver has prescribed.  You wake from sleep with chest pain or shortness of breath. You feel dizzy or faint. You have chest pain not typical of your usual pain for which you originally saw your caregiver.  You have any other emergent concerns regarding your health.  Additional Information: Chest pain comes from many different causes. Your caregiver has diagnosed you as having chest pain that is not specific for one problem, but does not require admission.  You are at low risk for an acute heart condition or other serious illness.   Your vital signs today were: BP (!) 146/92 (BP Location: Left Arm)    Pulse 69    Temp 98.2 F (36.8 C) (Oral)    Resp 18    Ht 5\' 3"  (1.6 m)    Wt 64.4 kg (142 lb)    SpO2 100%    BMI 25.15 kg/m  If your blood pressure (BP) was elevated  above 135/85 this visit, please have this repeated by your doctor within one month. --------------

## 2017-03-20 NOTE — Progress Notes (Signed)
HPI:   Leslie Boyer is a 61 y.o. female, who is here today for 6 months follow up. She was last seen on 11/20/2016 for acute visit. Since her last OV she was in the ER c/o chest pain (03/08/17). Pressure mid sternon, 3/10,not radiated.  Lab Results  Component Value Date   DDIMER 0.60 (H) 03/08/2017    Chest CTA negative for PE. + Gallstones.  Lab Results  Component Value Date   CREATININE 0.91 03/08/2017   BUN 13 03/08/2017   NA 136 03/08/2017   K 3.7 03/08/2017   CL 101 03/08/2017   CO2 27 03/08/2017   Lab Results  Component Value Date   WBC 5.8 03/08/2017   HGB 12.4 03/08/2017   HCT 36.7 03/08/2017   MCV 91.5 03/08/2017   PLT 282 03/08/2017   Chest pain has resolved.  Hx of GERD/dyspepsia, she has not been compliant with Protonix 20 mg but otherwise symptoms have improved.  Denies abdominal pain, nausea, vomiting, changes in bowel habits, blood in stool or melena.  Vit D deficiency: She was on Ergocalciferol 50,000 U, completed 8 weeks but did not continue.  09/2016 25 OH vit D 9.5.  Still feeling fatigue, states that she does not sleep "enough." She lives with her 5 grandchildren, due to there daily extra curricular activities she gets home late and has to get up early in the morning for school.  Lab Results  Component Value Date   TSH 0.97 09/19/2016    Hyperlipidemia:  Currently on Lipitor 20 mg, which she has not been taking daily, has been more consistent for the past week. Following a low fat diet: Yes.  She has not noted side effects with medication.  Lab Results  Component Value Date   CHOL 338 (H) 09/19/2016   HDL 59.60 09/19/2016   LDLCALC 253 (H) 09/19/2016   LDLDIRECT 224.3 06/08/2007   TRIG 123.0 09/19/2016   CHOLHDL 6 09/19/2016     She states that she just had labs done for health insurance and has FLP done among other lab work.   Review of Systems  Constitutional: Positive for fatigue. Negative for activity change,  appetite change, fever and unexpected weight change.  HENT: Negative for mouth sores, nosebleeds and trouble swallowing.   Eyes: Negative for redness and visual disturbance.  Respiratory: Negative for cough, shortness of breath and wheezing.   Cardiovascular: Negative for chest pain, palpitations and leg swelling.  Gastrointestinal: Negative for abdominal pain, nausea and vomiting.       Negative for changes in bowel habits.  Endocrine: Negative for cold intolerance and heat intolerance.  Genitourinary: Negative for decreased urine volume, dysuria and hematuria.  Musculoskeletal: Negative for gait problem and myalgias.  Skin: Negative for pallor and rash.  Neurological: Negative for syncope, weakness and headaches.  Hematological: Negative for adenopathy. Does not bruise/bleed easily.  Psychiatric/Behavioral: Positive for sleep disturbance. Negative for confusion. The patient is nervous/anxious.       Current Outpatient Prescriptions on File Prior to Visit  Medication Sig Dispense Refill  . atorvastatin (LIPITOR) 20 MG tablet TAKE 1 TABLET (20 MG TOTAL) BY MOUTH DAILY. 90 tablet 1  . Vitamin D, Ergocalciferol, (DRISDOL) 50000 units CAPS capsule TAKE 1 CAPSULE BY MOUTH ONCE A WEEK FOR 16 WEEKS, THEN EVERY 2 WEEKS. (Patient not taking: Reported on 03/21/2017) 4 capsule 4   No current facility-administered medications on file prior to visit.      Past Medical History:  Diagnosis  Date  . 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 History of tobacco abuse   . Hyperlipidemia   . Osteopenia    Allergies  Allergen Reactions  . Sulfonamide Derivatives     REACTION: Hives  . Penicillins Rash    Social History   Social History  . Marital status: Divorced    Spouse name: N/A  . Number of children: N/A  . Years of education: N/A   Social History Main Topics  . Smoking status: Former Research scientist (life sciences)  . Smokeless tobacco: Never Used  . Alcohol use No  . Drug use: No  .  Sexual activity: Yes    Birth control/ protection: Surgical   Other Topics Concern  . None   Social History Narrative   Former Smoker   Alcohol use-no     Occupation:  [employer]     3 daughters   1 son   Divorced  (husband had drug problem)     Vitals:   03/21/17 1033  BP: 118/72  Pulse: 70  Resp: 12  SpO2: 93%   Body mass index is 25.53 kg/m.  Wt Readings from Last 3 Encounters:  03/21/17 144 lb 2 oz (65.4 kg)  03/08/17 142 lb (64.4 kg)  02/12/17 140 lb (63.5 kg)     Physical Exam  Nursing note and vitals reviewed. Constitutional: She is oriented to person, place, and time. She appears well-developed and well-nourished. No distress.  HENT:  Head: Normocephalic and atraumatic.  Mouth/Throat: Oropharynx is clear and moist and mucous membranes are normal.  Eyes: Pupils are equal, round, and reactive to light. Conjunctivae and EOM are normal.  Neck: No tracheal deviation present. No thyroid mass and no thyromegaly present.  Cardiovascular: Normal rate and regular rhythm.   No murmur heard. Pulses:      Dorsalis pedis pulses are 2+ on the right side, and 2+ on the left side.  Respiratory: Effort normal and breath sounds normal. No respiratory distress.  GI: Soft. She exhibits no mass. There is no hepatomegaly. There is no tenderness.  Musculoskeletal: She exhibits no edema or tenderness.  Lymphadenopathy:    She has no cervical adenopathy.  Neurological: She is alert and oriented to person, place, and time. She has normal strength. Coordination and gait normal.  Skin: Skin is warm. No erythema.  Psychiatric: Her mood appears anxious.  Well groomed, good eye contact.    ASSESSMENT AND PLAN:   Leslie Boyer was seen today for follow-up.  Diagnoses and all orders for this visit:  Chronic fatigue, unspecified  We discussed possible etiologies: Systemic illness, immunologic,endocrinology,sleep disorder, psychiatric/psychologic, infectious,medications side  effects, and idiopathic. In her case most likely related to poor sleep and stress. Examination today does not suggest a serious process. Healthy diet and regular physical activity may help.  Further recommendations will be given according to lab results.  Vitamin D deficiency  She has not been compliant with treatment. Recommend starting OTC vitamin D 2000 units daily. We adjust dose according to lab results.  -     VITAMIN D 25 Hydroxy (Vit-D Deficiency, Fractures)  Hyperlipidemia, unspecified hyperlipidemia type  Daily aspirin 81 mg recommended. We discussed benefits as well as side effects of statin medications, strongly recommend taking a daily. Continue low-fat diet. She will bring copy of recent lab results. F/U in 6 months.  Chest pain, unspecified type  Resolved. Instructed about warning signs. F/U as needed.  Dyspepsia  Continue nonpharmacologic treatment. GERD precautions  discussed. Follow-up as needed.  Cholelithiasis without cholecystitis  We discussed CT findings. Currently asymptomatic, so cholecystectomy is not recommended at this time. Instructed about warning signs. Follow-up as needed.  Healthcare maintenance -     Zoster Vac Recomb Adjuvanted [facility]) injection; Inject 0.5 mLs into the muscle every 3 (three) months.    -Ms. Leeanne Mannan was advised to return sooner than planned today if new concerns arise.       Betty G. Martinique, MD  [facility]. [place] office.

## 2017-03-21 ENCOUNTER — Ambulatory Visit (INDEPENDENT_AMBULATORY_CARE_PROVIDER_SITE_OTHER): Payer: BLUE CROSS/BLUE SHIELD | Admitting: Family Medicine

## 2017-03-21 ENCOUNTER — Encounter: Payer: Self-pay | Admitting: Family Medicine

## 2017-03-21 VITALS — BP 118/72 | HR 70 | Resp 12 | Ht 63.0 in | Wt 144.1 lb

## 2017-03-21 DIAGNOSIS — R1013 Epigastric pain: Secondary | ICD-10-CM

## 2017-03-21 DIAGNOSIS — R079 Chest pain, unspecified: Secondary | ICD-10-CM

## 2017-03-21 DIAGNOSIS — K8020 Calculus of gallbladder without cholecystitis without obstruction: Secondary | ICD-10-CM | POA: Diagnosis not present

## 2017-03-21 DIAGNOSIS — R5382 Chronic fatigue, unspecified: Secondary | ICD-10-CM

## 2017-03-21 DIAGNOSIS — E559 Vitamin D deficiency, unspecified: Secondary | ICD-10-CM

## 2017-03-21 DIAGNOSIS — E785 Hyperlipidemia, unspecified: Secondary | ICD-10-CM | POA: Diagnosis not present

## 2017-03-21 DIAGNOSIS — Z0000 Encounter for general adult medical examination without abnormal findings: Secondary | ICD-10-CM

## 2017-03-21 LAB — VITAMIN D 25 HYDROXY (VIT D DEFICIENCY, FRACTURES): VITD: 17.16 ng/mL — ABNORMAL LOW (ref 30.00–100.00)

## 2017-03-21 MED ORDER — ZOSTER VAC RECOMB ADJUVANTED 50 MCG/0.5ML IM SUSR: 0.5000 mL | INTRAMUSCULAR | 1 refills | Status: AC

## 2017-03-21 NOTE — Patient Instructions (Addendum)
A few things to remember from today's visit:   Vitamin D deficiency - Plan: VITAMIN D 25 Hydroxy (Vit-D Deficiency, Fractures)  Hyperlipidemia, unspecified hyperlipidemia type  Chest pain, unspecified type  Dyspepsia  Cholelithiasis without cholecystitis  Please bring a copy of recent labs.  Please try to take cholesterol med daily.  Over the counter Vit D 2000 U.  Please be sure medication list is accurate. If a new problem present, please set up appointment sooner than planned today.

## 2017-05-05 ENCOUNTER — Other Ambulatory Visit: Payer: Self-pay | Admitting: Family Medicine

## 2017-05-16 ENCOUNTER — Other Ambulatory Visit: Payer: Self-pay | Admitting: Obstetrics & Gynecology

## 2017-05-16 DIAGNOSIS — Z1231 Encounter for screening mammogram for malignant neoplasm of breast: Secondary | ICD-10-CM

## 2017-05-19 ENCOUNTER — Encounter: Payer: Self-pay | Admitting: Gastroenterology

## 2017-05-22 ENCOUNTER — Ambulatory Visit: Payer: Commercial Managed Care - POS | Attending: Obstetrics & Gynecology

## 2017-05-22 DIAGNOSIS — Z1231 Encounter for screening mammogram for malignant neoplasm of breast: Secondary | ICD-10-CM

## 2017-06-28 DIAGNOSIS — Z1231 Encounter for screening mammogram for malignant neoplasm of breast: Secondary | ICD-10-CM | POA: Diagnosis not present

## 2017-07-16 ENCOUNTER — Encounter: Payer: Self-pay | Admitting: Nurse Practitioner

## 2017-07-16 ENCOUNTER — Ambulatory Visit (INDEPENDENT_AMBULATORY_CARE_PROVIDER_SITE_OTHER): Payer: BLUE CROSS/BLUE SHIELD

## 2017-07-16 ENCOUNTER — Ambulatory Visit: Payer: BLUE CROSS/BLUE SHIELD | Admitting: Nurse Practitioner

## 2017-07-16 VITALS — BP 118/74 | HR 90 | Temp 98.0°F | Ht 63.0 in | Wt 140.0 lb

## 2017-07-16 DIAGNOSIS — J101 Influenza due to other identified influenza virus with other respiratory manifestations: Secondary | ICD-10-CM | POA: Diagnosis not present

## 2017-07-16 DIAGNOSIS — R5381 Other malaise: Secondary | ICD-10-CM

## 2017-07-16 DIAGNOSIS — R0789 Other chest pain: Secondary | ICD-10-CM

## 2017-07-16 DIAGNOSIS — R05 Cough: Secondary | ICD-10-CM | POA: Diagnosis not present

## 2017-07-16 DIAGNOSIS — R5383 Other fatigue: Secondary | ICD-10-CM | POA: Diagnosis not present

## 2017-07-16 LAB — POC INFLUENZA A&B (BINAX/QUICKVUE)
INFLUENZA A, POC: POSITIVE — AB
Influenza B, POC: NEGATIVE

## 2017-07-16 MED ORDER — OSELTAMIVIR PHOSPHATE 75 MG PO CAPS: 75.0000 mg | ORAL_CAPSULE | Freq: Two times a day (BID) | ORAL | 0 refills | Status: DC

## 2017-07-16 MED ORDER — HYDROCODONE-HOMATROPINE 5-1.5 MG/5ML PO SYRP: 5.0000 mL | ORAL_SOLUTION | Freq: Four times a day (QID) | ORAL | 0 refills | Status: DC | PRN

## 2017-07-16 MED ORDER — ONDANSETRON HCL 4 MG PO TABS: 4.0000 mg | ORAL_TABLET | Freq: Three times a day (TID) | ORAL | 0 refills | Status: DC | PRN

## 2017-07-16 MED ORDER — IPRATROPIUM BROMIDE 0.03 % NA SOLN: 2.0000 | Freq: Two times a day (BID) | NASAL | 0 refills | Status: DC

## 2017-07-16 MED ORDER — DM-GUAIFENESIN ER 30-600 MG PO TB12: 1.0000 | ORAL_TABLET | Freq: Two times a day (BID) | ORAL | 0 refills | Status: DC | PRN

## 2017-07-16 NOTE — Progress Notes (Signed)
Subjective:  Patient ID: Leslie Boyer, female    DOB: 12-09-1955  Age: 62 y.o. MRN: [id number]  CC: Cough (coughing,bodyache,vomit and diarrhea,weak,discomfort near chest area?---3 days. work with pt dx strep and flu)   Cough  This is a new problem. The current episode started in the past 7 days. The problem has been gradually worsening. The problem occurs constantly. The cough is non-productive. Associated symptoms include chest pain, chills, ear congestion, a fever, headaches, myalgias, nasal congestion, postnasal drip and rhinorrhea. Pertinent negatives include no sore throat, shortness of breath or wheezing. The symptoms are aggravated by lying down. She has tried OTC cough suppressant for the symptoms. The treatment provided no relief.  cough also associated with nausea and diarrhea, had 1episode of emesis and diarrhea (no blood)  Outpatient Medications Prior to Visit  Medication Sig Dispense Refill  . atorvastatin (LIPITOR) 20 MG tablet TAKE 1 TABLET (20 MG TOTAL) BY MOUTH DAILY. 90 tablet 1  . Vitamin D, Ergocalciferol, (DRISDOL) 50000 units CAPS capsule TAKE 1 CAPSULE BY MOUTH ONCE A WEEK FOR 16 WEEKS, THEN EVERY 2 WEEKS. (Patient not taking: Reported on 07/16/2017) 4 capsule 2   No facility-administered medications prior to visit.     ROS See HPI  Objective:  BP 118/74   Pulse 90   Temp 98 F (36.7 C)   Ht 5\' 3"  (1.6 m)   Wt 140 lb (63.5 kg)   SpO2 93%   BMI 24.80 kg/m   BP Readings from Last 3 Encounters:  07/16/17 118/74  03/21/17 118/72  03/09/17 107/78    Wt Readings from Last 3 Encounters:  07/16/17 140 lb (63.5 kg)  03/21/17 144 lb 2 oz (65.4 kg)  03/08/17 142 lb (64.4 kg)    Physical Exam  Constitutional: She is oriented to person, place, and time. No distress.  HENT:  Right Ear: Tympanic membrane, external ear and ear canal normal.  Left Ear: Tympanic membrane, external ear and ear canal normal.  Nose: Mucosal edema and rhinorrhea present. Right  sinus exhibits maxillary sinus tenderness and frontal sinus tenderness. Left sinus exhibits maxillary sinus tenderness and frontal sinus tenderness.  Mouth/Throat: Uvula is midline. No trismus in the jaw. Posterior oropharyngeal erythema present. No oropharyngeal exudate.  Eyes: No scleral icterus.  Neck: Normal range of motion. Neck supple.  Cardiovascular: Normal rate and normal heart sounds.  Pulmonary/Chest: Effort normal and breath sounds normal. No respiratory distress. She exhibits no tenderness.  Abdominal: Soft. She exhibits no distension. There is no tenderness.  Musculoskeletal: She exhibits no edema.  Lymphadenopathy:    She has no cervical adenopathy.  Neurological: She is alert and oriented to person, place, and time.  Skin: Skin is warm and dry. No rash noted.  Psychiatric: She has a normal mood and affect. Her behavior is normal.  Vitals reviewed.   Lab Results  Component Value Date   WBC 5.8 03/08/2017   HGB 12.4 03/08/2017   HCT 36.7 03/08/2017   PLT 282 03/08/2017   GLUCOSE 142 (H) 03/08/2017   CHOL 338 (H) 09/19/2016   TRIG 123.0 09/19/2016   HDL 59.60 09/19/2016   LDLDIRECT 224.3 06/08/2007   LDLCALC 253 (H) 09/19/2016   ALT 9 09/19/2016   AST 13 09/19/2016   NA 136 03/08/2017   K 3.7 03/08/2017   CL 101 03/08/2017   CREATININE 0.91 03/08/2017   BUN 13 03/08/2017   CO2 27 03/08/2017   TSH 0.97 09/19/2016   INR 0.9 RATIO 06/08/2007  HGBA1C 6.1 09/19/2016    Dg Chest 2 View  Result Date: 03/08/2017 CLINICAL DATA:  Chest pain EXAM: CHEST  2 VIEW COMPARISON:  06/16/2009 FINDINGS: The heart size and mediastinal contours are within normal limits. Both lungs are clear. The visualized skeletal structures are unremarkable. IMPRESSION: No active cardiopulmonary disease. Electronically Signed   By: Inez Catalina M.D.   On: 03/08/2017 21:27   Ct Angio Chest Pe W And/or Wo Contrast  Result Date: 03/08/2017 CLINICAL DATA:  Chest pain EXAM: CT ANGIOGRAPHY CHEST  WITH CONTRAST TECHNIQUE: Multidetector CT imaging of the chest was performed using the standard protocol during bolus administration of intravenous contrast. Multiplanar CT image reconstructions and MIPs were obtained to evaluate the vascular anatomy. CONTRAST:  100 cc Isovue 370 IV COMPARISON:  03/08/2017 chest x-ray FINDINGS: Cardiovascular: No filling defects in the pulmonary arteries to suggest pulmonary emboli. Heart is upper limits normal in size. Aorta is normal caliber. No dissection. Mediastinum/Nodes: No mediastinal, hilar, or axillary adenopathy. Trachea and esophagus are unremarkable. Lungs/Pleura: Mild paraseptal and centrilobular emphysema. Lungs are clear. No focal airspace opacities or suspicious nodules. No effusions. Upper Abdomen: Gallstones partially imaged within the gallbladder. No acute findings. Musculoskeletal: Chest wall soft tissues are unremarkable. No acute bony abnormality. Review of the MIP images confirms the above findings. IMPRESSION: No evidence of pulmonary embolus. Cholelithiasis. Emphysema (ICD10-J43.9). Electronically Signed   By: Rolm Baptise M.D.   On: 03/08/2017 23:56    Assessment & Plan:   Analyse was seen today for cough.  Diagnoses and all orders for this visit:  Influenza A -     POC Influenza A&B(BINAX/QUICKVUE) -     ondansetron (ZOFRAN) 4 MG tablet; Take 1 tablet (4 mg total) by mouth every 8 (eight) hours as needed for nausea or vomiting. -     DG Chest 2 View -     HYDROcodone-homatropine (HYCODAN) 5-1.5 MG/5ML syrup; Take 5 mLs by mouth every 6 (six) hours as needed for cough. -     dextromethorphan-guaiFENesin (MUCINEX DM) 30-600 MG 12hr tablet; Take 1 tablet by mouth 2 (two) times daily as needed for cough. -     ipratropium (ATROVENT) 0.03 % nasal spray; Place 2 sprays into both nostrils 2 (two) times daily. Do not use for more than 5days. -     oseltamivir (TAMIFLU) 75 MG capsule; Take 1 capsule (75 mg total) by mouth 2 (two) times  daily.  Anterior chest wall pain -     DG Chest 2 View  Malaise and fatigue -     DG Chest 2 View   I am having Leslie Boyer start on ondansetron, HYDROcodone-homatropine, dextromethorphan-guaiFENesin, ipratropium, and oseltamivir. I am also having her maintain her atorvastatin and Vitamin D (Ergocalciferol).  Meds ordered this encounter  Medications  . ondansetron (ZOFRAN) 4 MG tablet    Sig: Take 1 tablet (4 mg total) by mouth every 8 (eight) hours as needed for nausea or vomiting.    Dispense:  20 tablet    Refill:  0    Order Specific Question:   Supervising Provider    Answer:   Lucille Passy [[id number]]  . HYDROcodone-homatropine (HYCODAN) 5-1.5 MG/5ML syrup    Sig: Take 5 mLs by mouth every 6 (six) hours as needed for cough.    Dispense:  60 mL    Refill:  0    Order Specific Question:   Supervising Provider    Answer:   Lucille Passy [[id number]]  . dextromethorphan-guaiFENesin (  MUCINEX DM) 30-600 MG 12hr tablet    Sig: Take 1 tablet by mouth 2 (two) times daily as needed for cough.    Dispense:  14 tablet    Refill:  0    Order Specific Question:   Supervising Provider    Answer:   Lucille Passy [[id number]]  . ipratropium (ATROVENT) 0.03 % nasal spray    Sig: Place 2 sprays into both nostrils 2 (two) times daily. Do not use for more than 5days.    Dispense:  30 mL    Refill:  0    Order Specific Question:   Supervising Provider    Answer:   Lucille Passy [[id number]]  . oseltamivir (TAMIFLU) 75 MG capsule    Sig: Take 1 capsule (75 mg total) by mouth 2 (two) times daily.    Dispense:  10 capsule    Refill:  0    Order Specific Question:   Supervising Provider    Answer:   Lucille Passy [[id number]]    Follow-up: Return if symptoms worsen or fail to improve.  Wilfred Lacy, NP

## 2017-07-16 NOTE — Patient Instructions (Addendum)
Go to lab for CXR. You will be called with results.  URI Instructions: Encourage adequate oral hydration.  Use over-the-counter  "cold" medicines  such as "Tylenol cold" , "Advil cold",  "Mucinex" or" Mucinex D"  for cough and congestion.  Avoid decongestants if you have high blood pressure. Use" Delsym" or" Robitussin" cough syrup varietis for cough.  You can use plain "Tylenol" or "Advi"l for fever, chills and achyness.   "Common cold" symptoms are usually triggered by a virus.  The antibiotics are usually not necessary. On average, a" viral cold" illness would take 4-7 days to resolve. Please, make an appointment if you are not better or if you're worse.  Maintain Brat diet x 2days, then advance as tolerated.   Influenza, Adult Influenza, more commonly known as "the flu," is a viral infection that primarily affects the respiratory tract. The respiratory tract includes organs that help you breathe, such as the lungs, nose, and throat. The flu causes many common cold symptoms, as well as a high fever and body aches. The flu spreads easily from person to person (is contagious). Getting a flu shot (influenza vaccination) every year is the best way to prevent influenza. What are the causes? Influenza is caused by a virus. You can catch the virus by:  Breathing in droplets from an infected person's cough or sneeze.  Touching something that was recently contaminated with the virus and then touching your mouth, nose, or eyes.  What increases the risk? The following factors may make you more likely to get the flu:  Not cleaning your hands frequently with soap and water or alcohol-based hand sanitizer.  Having close contact with many people during cold and flu season.  Touching your mouth, eyes, or nose without washing or sanitizing your hands first.  Not drinking enough fluids or not eating a healthy diet.  Not getting enough sleep or exercise.  Being under a high amount of  stress.  Not getting a yearly (annual) flu shot.  You may be at a higher risk of complications from the flu, such as a severe lung infection (pneumonia), if you:  Are over the age of 62.  Are pregnant.  Have a weakened disease-fighting system (immune system). You may have a weakened immune system if you: ? Have HIV or AIDS. ? Are undergoing chemotherapy. ? Aretaking medicines that reduce the activity of (suppress) the immune system.  Have a long-term (chronic) illness, such as heart disease, kidney disease, diabetes, or lung disease.  Have a liver disorder.  Are obese.  Have anemia.  What are the signs or symptoms? Symptoms of this condition typically last 4-10 days and may include:  Fever.  Chills.  Headache, body aches, or muscle aches.  Sore throat.  Cough.  Runny or congested nose.  Chest discomfort and cough.  Poor appetite.  Weakness or tiredness (fatigue).  Dizziness.  Nausea or vomiting.  How is this diagnosed? This condition may be diagnosed based on your medical history and a physical exam. Your health care provider may do a nose or throat swab test to confirm the diagnosis. How is this treated? If influenza is detected early, you can be treated with antiviral medicine that can reduce the length of your illness and the severity of your symptoms. This medicine may be given by mouth (orally) or through an IV tube that is inserted in one of your veins. The goal of treatment is to relieve symptoms by taking care of yourself at home. This may include  taking over-the-counter medicines, drinking plenty of fluids, and adding humidity to the air in your home. In some cases, influenza goes away on its own. Severe influenza or complications from influenza may be treated in a hospital. Follow these instructions at home:  Take over-the-counter and prescription medicines only as told by your health care provider.  Use a cool mist humidifier to add humidity to  the air in your home. This can make breathing easier.  Rest as needed.  Drink enough fluid to keep your urine clear or pale yellow.  Cover your mouth and nose when you cough or sneeze.  Wash your hands with soap and water often, especially after you cough or sneeze. If soap and water are not available, use hand sanitizer.  Stay home from work or school as told by your health care provider. Unless you are visiting your health care provider, try to avoid leaving home until your fever has been gone for 24 hours without the use of medicine.  Keep all follow-up visits as told by your health care provider. This is important. How is this prevented?  Getting an annual flu shot is the best way to avoid getting the flu. You may get the flu shot in late summer, fall, or winter. Ask your health care provider when you should get your flu shot.  Wash your hands often or use hand sanitizer often.  Avoid contact with people who are sick during cold and flu season.  Eat a healthy diet, drink plenty of fluids, get enough sleep, and exercise regularly. Contact a health care provider if:  You develop new symptoms.  You have: ? Chest pain. ? Diarrhea. ? A fever.  Your cough gets worse.  You produce more mucus.  You feel nauseous or you vomit. Get help right away if:  You develop shortness of breath or difficulty breathing.  Your skin or nails turn a bluish color.  You have severe pain or stiffness in your neck.  You develop a sudden headache or sudden pain in your face or ear.  You cannot stop vomiting. This information is not intended to replace advice given to you by your health care provider. Make sure you discuss any questions you have with your health care provider. Document Released: 06/28/2000 Document Revised: 12/07/2015 Document Reviewed: 04/25/2015 Elsevier Interactive Patient Education  2017 [employer].

## 2017-07-18 ENCOUNTER — Telehealth: Payer: Self-pay | Admitting: Family Medicine

## 2017-07-18 NOTE — Telephone Encounter (Signed)
Copied from [place]. Topic: Quick Communication - See Telephone Encounter >> Jul 18, 2017  9:08 AM Percell Belt A wrote: CRM for notification. See Telephone encounter for: pt called in and said that she was just seen but since then she has not been able to get any rest .  She hasn't been able to sleep for 3 days.  She would like to know if there is anything she could take to help her sleep?    Pharmacy -[facility] [address]   07/18/17.

## 2017-07-18 NOTE — Telephone Encounter (Signed)
OTC sleep aids may help: Melatonin ER 5 mg at bedtime OR Unisom. Appt if not better.  Thanks, BJ

## 2017-07-18 NOTE — Telephone Encounter (Signed)
Pt called to receive instructions from MD, per Dr Martinique "OTC sleep aids may help: Melatonin ER 5 mg at bedtime OR Unisom. Appt if not better.Thanks"; pt also states that wants Dr Martinique to know she was seen on 07/16/17 at [facility] and diagnosed with the flu; she states that she is tired but is taking her medication as ordered; will route this call to [facility] for notification of this encounter.

## 2017-07-18 NOTE — Telephone Encounter (Signed)
Left message to give clinic a call back. 

## 2017-07-18 NOTE — Telephone Encounter (Signed)
Message routed to Dr. Jordan for review 

## 2017-08-19 ENCOUNTER — Telehealth: Payer: Self-pay | Admitting: Nurse Practitioner

## 2017-08-19 ENCOUNTER — Other Ambulatory Visit: Payer: Self-pay | Admitting: Family Medicine

## 2017-08-19 NOTE — Telephone Encounter (Signed)
Please advise, pt request to return to work 07/21/2017 instead of 07/18/2017.   Left a vm for the pt to call back.     Copied from [place]. Topic: Inquiry >> Aug 19, 2017  3:08 PM Scherrie Gerlach wrote: Reason for CRM: pt saw Baldo Ash on 07/16/17 and dx with the flu. Pt has lost her work note, and wants to get another one, with return date of 07/21/17. Pt was given one, but has misplaced. At first employer did not need, now they are telling pt they do need it.

## 2017-08-19 NOTE — Telephone Encounter (Signed)
Pt called and report that she was not better and tama flu was not helping her (see phone note on 07/18/2017) but the note went to her PCP stead of [facility]. FYI

## 2017-08-20 NOTE — Telephone Encounter (Signed)
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 Do not change return date. With Influenza, symptoms are treated once tamiflu is completed. So, I need to know which symptoms are worsen or not improved.

## 2017-08-20 NOTE — Telephone Encounter (Signed)
Spoke with the pt, she will come pick up the work note.   Pt stated she called on 08/18/2017 and told them that she was not feeling better, still has a bad cough,chest hurt from coughing,headache that's why she didn't get any rest or sleep. FYI

## 2018-03-23 ENCOUNTER — Other Ambulatory Visit: Payer: Self-pay | Admitting: Gynecologic Oncology

## 2018-04-30 ENCOUNTER — Telehealth: Payer: Commercial Managed Care - POS

## 2018-04-30 ENCOUNTER — Encounter (HOSPITAL_BASED_OUTPATIENT_CLINIC_OR_DEPARTMENT_OTHER): Payer: Self-pay

## 2018-04-30 NOTE — Pre-Procedure Instructions (Addendum)
   Pt went to pcp for labs,ekg,medical eval-faxed for reports   Sees Cardiology every 6 mo due to HTN-faxed for lov note,any testing-note in Care Everywhere from 2018   [facility]

## 2018-05-04 ENCOUNTER — Encounter (HOSPITAL_BASED_OUTPATIENT_CLINIC_OR_DEPARTMENT_OTHER): Payer: Self-pay

## 2018-05-04 NOTE — Pre-Procedure Instructions (Addendum)
   Spoke with Hana at pcp requested pre-op note,labs,EKG to be faxed to [phone].   05/04/18 [id number]; spoke with Toniann Fail at cardiology stated  LON 01/2017 , echo 03/2017 , see care everywhere

## 2018-05-06 NOTE — H&P (Signed)
Gynecology Oncology Preoperative History and Physical Exam  This H&P was written from available EPIC records and attendings' office notes.    Date/Time: 05/06/18, 3:41 PM  Patient Name: [name] [name]  Attending Physician: Zackery Barefoot, MD    CC: Patient is a 62 y.o. female presenting to [facility] for scheduled surgery.     ROS: (per outpatient records on 10/08)  Denies fever/chills, HA, visual changes, lightheadedness, dizziness, SOB, CP, palpitations, N/V, diarrhea, constipation, dysuria, hematuria.    Past Medical History:     Past Medical History:   Diagnosis Date   . Coronary artery disease, non-occlusive 2010    nonobstructive s/p cath 2010   . Fibroid, uterine 05/2012   . H/O echocardiogram 06/28/13    EF 65%, mild LVH   . H/O mammogram 03/15/2102     normal    . Hypertensive disorder 2004    controlled-145/80   . Left ventricular hypertrophy 2010    s/p echo   . Ovarian cyst, right 05/2012   . Pap smear for cervical cancer screening 07/25/2101    no hx of abnormal pap smear   . Post-operative nausea and vomiting     needs "patch"       Past Surgical History:     Past Surgical History:   Procedure Laterality Date   . CARDIAC CATHETERIZATION  2010    non-obs CAD   . INGUINAL HERNIA REPAIR  2004    right   . MYOMECTOMY  04/2014   . TUBAL LIGATION  1984       Medications:     Current/Home Medications    CALCIUM-MAGNESIUM 500-250 MG TAB    Take 1 tablet by mouth daily.    CARVEDILOL (COREG) 25 MG TABLET    Take 2 tablets (50 mg total) by mouth 2 (two) times daily with meals.    CHOLECALCIFEROL (VITAMIN D) 2000 UNITS CAP    Take 2,000 Unit by mouth daily.    IRBESARTAN-HYDROCHLOROTHIAZIDE (AVALIDE) 300-12.5 MG PER TABLET    Take 1 tablet by mouth nightly    SOY ISOFLAVONE PO    Take 250 mg by mouth daily.    SPIRONOLACTONE (ALDACTONE) 50 MG TABLET    Take 1 tablet (50 mg total) by mouth daily.    VALSARTAN-HYDROCHLOROTHIAZIDE (DIOVAN-HCT) 320-12.5 MG PER TABLET    Take 1 tablet by mouth daily.        Allergies:      Allergies   Allergen Reactions   . Aspirin Shortness Of Breath and Rash   . Levaquin [Levofloxacin Hemihydrate]      Joint pains   . Other Swelling     Antihistamines   . Sudafed [Pseudoephedrine] Swelling   . Tetanus Immune Globulin      Skin reaction       Social History:     Social History     Socioeconomic History   . Marital status: Married     Spouse name: Not on file   . Number of children: Not on file   . Years of education: Not on file   . Highest education level: Not on file   Occupational History   . Not on file   Social Needs   . Financial resource strain: Not on file   . Food insecurity:     Worry: Not on file     Inability: Not on file   . Transportation needs:     Medical: Not on file     Non-medical: Not  on file   Tobacco Use   . Smoking status: Never Smoker   . Smokeless tobacco: Never Used   Substance and Sexual Activity   . Alcohol use: Yes     Alcohol/week: 0.0 standard drinks     Comment: socially   . Drug use: No   . Sexual activity: Not on file   Lifestyle   . Physical activity:     Days per week: Not on file     Minutes per session: Not on file   . Stress: Not on file   Relationships   . Social connections:     Talks on phone: Not on file     Gets together: Not on file     Attends religious service: Not on file     Active member of club or organization: Not on file     Attends meetings of clubs or organizations: Not on file     Relationship status: Not on file   . Intimate partner violence:     Fear of current or ex partner: Not on file     Emotionally abused: Not on file     Physically abused: Not on file     Forced sexual activity: Not on file   Other Topics Concern   . Not on file   Social History Narrative   . Not on file       Family History:     Family History   Problem Relation Age of Onset   . Breast cancer Mother 71        breast ca    . Bone cancer Mother    . Heart failure Sister    . Heart failure Brother        Physical Exam:   (Per outpatient records on 10/08)  Ht 1.626 m  (5\' 4" )   Wt 98.4 kg (217 lb)   BMI 37.25 kg/m    Gen: NAD  CV: RRR  Pulm: CTAB  Pelvic exam deferred to OR    Labs & Imaging:   Pre-op labs (10/04):  H/H 13.7/39.7  Cr 1.14  AST/ALT 21/18      Assessment & Plan:   62 y.o. female is here for [facility].  - Proceed to OR  - Diet: NPO  - ERAS protocol - Tylenol 1000g and Gabapentin 100mg   - Ancef 2g prior to incision  - DVT prophylaxis: SCDs and Lovenox  - Consent signed    D/w Dr. Cyndy Freeze, MD PGY-1    Please note that this H&P will be addended on day of surgery by surgical attending.

## 2018-05-07 ENCOUNTER — Ambulatory Visit (HOSPITAL_BASED_OUTPATIENT_CLINIC_OR_DEPARTMENT_OTHER): Payer: Commercial Managed Care - POS | Admitting: Anesthesiology

## 2018-05-07 ENCOUNTER — Ambulatory Visit: Payer: Self-pay

## 2018-05-07 ENCOUNTER — Encounter (HOSPITAL_BASED_OUTPATIENT_CLINIC_OR_DEPARTMENT_OTHER)
Admission: RE | Disposition: A | Discharge status: Home / Self Care | Payer: Self-pay | Source: Ambulatory Visit | Attending: Gynecologic Oncology

## 2018-05-07 ENCOUNTER — Ambulatory Visit
Admission: RE | Admit: 2018-05-07 | Discharge: 2018-05-08 | Disposition: A | Discharge status: Home / Self Care | Payer: Commercial Managed Care - POS | Source: Ambulatory Visit | Attending: Gynecologic Oncology | Admitting: Gynecologic Oncology

## 2018-05-07 DIAGNOSIS — D3911 Neoplasm of uncertain behavior of right ovary: Secondary | ICD-10-CM | POA: Diagnosis present

## 2018-05-07 DIAGNOSIS — I2510 Atherosclerotic heart disease of native coronary artery without angina pectoris: Secondary | ICD-10-CM | POA: Insufficient documentation

## 2018-05-07 DIAGNOSIS — D259 Leiomyoma of uterus, unspecified: Secondary | ICD-10-CM | POA: Insufficient documentation

## 2018-05-07 DIAGNOSIS — I10 Essential (primary) hypertension: Secondary | ICD-10-CM | POA: Insufficient documentation

## 2018-05-07 DIAGNOSIS — Z01818 Encounter for other preprocedural examination: Secondary | ICD-10-CM

## 2018-05-07 DIAGNOSIS — D270 Benign neoplasm of right ovary: Secondary | ICD-10-CM

## 2018-05-07 DIAGNOSIS — N838 Other noninflammatory disorders of ovary, fallopian tube and broad ligament: Secondary | ICD-10-CM

## 2018-05-07 HISTORY — PX: LAPAROSCOPIC, LYSIS, ADHESIONS: SHX4534

## 2018-05-07 HISTORY — DX: Nausea with vomiting, unspecified: R11.2

## 2018-05-07 HISTORY — PX: CYSTOSCOPY: SHX3552

## 2018-05-07 HISTORY — DX: Other specified postprocedural states: Z98.890

## 2018-05-07 HISTORY — PX: LAPAROSCOPIC, HYSTERECTOMY, TOTAL, BSO: SHX4523

## 2018-05-07 LAB — TYPE AND SCREEN
AB Screen Gel: NEGATIVE
ABO Rh: O POS

## 2018-05-07 LAB — GLUCOSE WHOLE BLOOD - POCT: Whole Blood Glucose POCT: 116 mg/dL — ABNORMAL HIGH (ref 70–100)

## 2018-05-07 SURGERY — LAPAROSCOPIC, HYSTERECTOMY, TOTAL, BSO
Anesthesia: Anesthesia General | Site: Bladder | Laterality: Bilateral | Wound class: Clean Contaminated

## 2018-05-07 MED ORDER — SODIUM CHLORIDE 0.9 % IV SOLN
INTRAVENOUS | Status: DC
Start: 2018-05-07 — End: 2018-05-08

## 2018-05-07 MED ORDER — ONDANSETRON HCL 4 MG/2ML IJ SOLN
4.0000 mg | Freq: Four times a day (QID) | INTRAMUSCULAR | Status: DC | PRN
Start: 2018-05-07 — End: 2018-05-08
  Administered 2018-05-07: 22:00:00 4 mg via INTRAVENOUS
  Filled 2018-05-07: qty 2

## 2018-05-07 MED ORDER — HYDRALAZINE HCL 20 MG/ML IJ SOLN
INTRAMUSCULAR | Status: DC | PRN
Start: 2018-05-07 — End: 2018-05-07
  Administered 2018-05-07: 5 mg via INTRAVENOUS

## 2018-05-07 MED ORDER — BUPIVACAINE-EPINEPHRINE (PF) 0.25% -1:200000 IJ SOLN
INTRAMUSCULAR | Status: AC
Start: 2018-05-07 — End: ?
  Filled 2018-05-07: qty 20

## 2018-05-07 MED ORDER — OXYCODONE HCL 5 MG PO TABS
ORAL_TABLET | ORAL | Status: AC
Start: 2018-05-07 — End: ?
  Filled 2018-05-07: qty 1

## 2018-05-07 MED ORDER — OXYCODONE HCL 5 MG PO TABS
10.0000 mg | ORAL_TABLET | ORAL | Status: DC | PRN
Start: 2018-05-07 — End: 2018-05-08
  Administered 2018-05-08: 10 mg via ORAL
  Filled 2018-05-07: qty 2

## 2018-05-07 MED ORDER — SIMETHICONE 80 MG PO CHEW
80.0000 mg | CHEWABLE_TABLET | Freq: Four times a day (QID) | ORAL | Status: DC | PRN
Start: 2018-05-07 — End: 2018-05-08

## 2018-05-07 MED ORDER — FAMOTIDINE 20 MG/2ML IV SOLN
INTRAVENOUS | Status: AC
Start: 2018-05-07 — End: ?
  Filled 2018-05-07: qty 2

## 2018-05-07 MED ORDER — ACETAMINOPHEN 500 MG PO TABS
1000.0000 mg | ORAL_TABLET | Freq: Once | ORAL | Status: AC
Start: 2018-05-07 — End: 2018-05-07
  Administered 2018-05-07: 07:00:00 1000 mg via ORAL

## 2018-05-07 MED ORDER — MIDAZOLAM HCL 2 MG/2ML IJ SOLN
INTRAMUSCULAR | Status: AC
Start: 2018-05-07 — End: ?
  Filled 2018-05-07: qty 2

## 2018-05-07 MED ORDER — OXYCODONE HCL 5 MG PO TABS
5.0000 mg | ORAL_TABLET | ORAL | Status: DC | PRN
Start: 2018-05-07 — End: 2018-05-08
  Administered 2018-05-07 (×2): 5 mg via ORAL
  Filled 2018-05-07: qty 1

## 2018-05-07 MED ORDER — LIDOCAINE HCL 2 % IJ SOLN
INTRAMUSCULAR | Status: DC | PRN
Start: 2018-05-07 — End: 2018-05-07
  Administered 2018-05-07: 100 mg

## 2018-05-07 MED ORDER — LIDOCAINE HCL (PF) 2 % IJ SOLN
INTRAMUSCULAR | Status: AC
Start: 2018-05-07 — End: ?
  Filled 2018-05-07: qty 5

## 2018-05-07 MED ORDER — PROPOFOL 10 MG/ML IV EMUL (WRAP)
INTRAVENOUS | Status: AC
Start: 2018-05-07 — End: ?
  Filled 2018-05-07: qty 20

## 2018-05-07 MED ORDER — FAMOTIDINE 10 MG/ML IV SOLN (WRAP)
INTRAVENOUS | Status: DC | PRN
Start: 2018-05-07 — End: 2018-05-07
  Administered 2018-05-07: 20 mg via INTRAVENOUS

## 2018-05-07 MED ORDER — LACTATED RINGERS IV SOLN
INTRAVENOUS | Status: DC
Start: 2018-05-07 — End: 2018-05-07

## 2018-05-07 MED ORDER — PROCHLORPERAZINE MALEATE 5 MG PO TABS
5.0000 mg | ORAL_TABLET | ORAL | Status: DC | PRN
Start: 2018-05-07 — End: 2018-05-08

## 2018-05-07 MED ORDER — PROCHLORPERAZINE EDISYLATE 10 MG/2ML IJ SOLN
5.0000 mg | INTRAMUSCULAR | Status: DC | PRN
Start: 2018-05-07 — End: 2018-05-08

## 2018-05-07 MED ORDER — ENOXAPARIN SODIUM 40 MG/0.4ML SC SOLN
SUBCUTANEOUS | Status: AC
Start: 2018-05-07 — End: ?
  Filled 2018-05-07: qty 0.4

## 2018-05-07 MED ORDER — STERILE WATER FOR IRRIGATION IR SOLN
Status: DC | PRN
Start: 2018-05-07 — End: 2018-05-07
  Administered 2018-05-07: 1000 mL
  Administered 2018-05-07: 200 mL

## 2018-05-07 MED ORDER — CEFAZOLIN SODIUM-DEXTROSE 2-3 GM-%(50ML) IV SOLR
2.0000 g | INTRAVENOUS | Status: AC
Start: 2018-05-07 — End: 2018-05-07
  Administered 2018-05-07: 08:00:00 2 g via INTRAVENOUS

## 2018-05-07 MED ORDER — ACETAMINOPHEN 325 MG PO TABS
650.0000 mg | ORAL_TABLET | Freq: Four times a day (QID) | ORAL | Status: DC
Start: 2018-05-07 — End: 2018-05-08
  Administered 2018-05-07 – 2018-05-08 (×4): 650 mg via ORAL
  Filled 2018-05-07 (×4): qty 2

## 2018-05-07 MED ORDER — ROCURONIUM BROMIDE 10 MG/ML IV SOLN (WRAP)
INTRAVENOUS | Status: DC | PRN
Start: 2018-05-07 — End: 2018-05-07
  Administered 2018-05-07: 50 mg via INTRAVENOUS

## 2018-05-07 MED ORDER — NEOSTIGMINE METHYLSULFATE 1 MG/ML IJ/IV SOLN (WRAP)
Status: DC | PRN
Start: 2018-05-07 — End: 2018-05-07
  Administered 2018-05-07: 3 mg via INTRAVENOUS

## 2018-05-07 MED ORDER — HYDRALAZINE HCL 20 MG/ML IJ SOLN
INTRAMUSCULAR | Status: AC
Start: 2018-05-07 — End: ?
  Filled 2018-05-07: qty 1

## 2018-05-07 MED ORDER — HYDROMORPHONE HCL 0.5 MG/0.5 ML IJ SOLN
0.2000 mg | INTRAMUSCULAR | Status: DC | PRN
Start: 2018-05-07 — End: 2018-05-08

## 2018-05-07 MED ORDER — FENTANYL CITRATE (PF) 50 MCG/ML IJ SOLN (WRAP)
INTRAMUSCULAR | Status: DC | PRN
Start: 2018-05-07 — End: 2018-05-07
  Administered 2018-05-07: 25 ug via INTRAVENOUS
  Administered 2018-05-07: 50 ug via INTRAVENOUS
  Administered 2018-05-07: 100 ug via INTRAVENOUS
  Administered 2018-05-07: 25 ug via INTRAVENOUS

## 2018-05-07 MED ORDER — SPIRONOLACTONE 25 MG PO TABS
25.00 mg | ORAL_TABLET | Freq: Every evening | ORAL | Status: DC
Start: 2018-05-07 — End: 2018-05-08
  Administered 2018-05-07: 23:00:00 25 mg via ORAL
  Filled 2018-05-07 (×2): qty 1

## 2018-05-07 MED ORDER — DOCUSATE SODIUM 100 MG PO CAPS
100.0000 mg | ORAL_CAPSULE | Freq: Two times a day (BID) | ORAL | Status: DC
Start: 2018-05-07 — End: 2018-05-08
  Administered 2018-05-07 – 2018-05-08 (×2): 100 mg via ORAL
  Filled 2018-05-07 (×2): qty 1

## 2018-05-07 MED ORDER — ONDANSETRON 4 MG PO TBDP
4.0000 mg | ORAL_TABLET | Freq: Four times a day (QID) | ORAL | Status: DC | PRN
Start: 2018-05-07 — End: 2018-05-08

## 2018-05-07 MED ORDER — SODIUM CHLORIDE 0.9% BAG (IRRIGATION USE)
INTRAVENOUS | Status: DC | PRN
Start: 2018-05-07 — End: 2018-05-07
  Administered 2018-05-07: 200 mL

## 2018-05-07 MED ORDER — GLYCOPYRROLATE 0.2 MG/ML IJ SOLN
INTRAMUSCULAR | Status: AC
Start: 2018-05-07 — End: ?
  Filled 2018-05-07: qty 1

## 2018-05-07 MED ORDER — FLUORESCEIN SODIUM 10 % IV SOLN
INTRAVENOUS | Status: AC
Start: 2018-05-07 — End: ?
  Filled 2018-05-07: qty 5

## 2018-05-07 MED ORDER — ONDANSETRON HCL 4 MG/2ML IJ SOLN
INTRAMUSCULAR | Status: AC
Start: 2018-05-07 — End: ?
  Filled 2018-05-07: qty 2

## 2018-05-07 MED ORDER — GLYCOPYRROLATE 0.2 MG/ML IJ SOLN
INTRAMUSCULAR | Status: AC
Start: 2018-05-07 — End: ?
  Filled 2018-05-07: qty 2

## 2018-05-07 MED ORDER — CARVEDILOL 25 MG PO TABS
25.00 mg | ORAL_TABLET | Freq: Two times a day (BID) | ORAL | Status: DC
Start: 2018-05-07 — End: 2018-05-08
  Administered 2018-05-07 – 2018-05-08 (×2): 25 mg via ORAL
  Filled 2018-05-07 (×4): qty 1

## 2018-05-07 MED ORDER — ONDANSETRON HCL 4 MG/2ML IJ SOLN
INTRAMUSCULAR | Status: DC | PRN
Start: 2018-05-07 — End: 2018-05-07
  Administered 2018-05-07: 4 mg via INTRAVENOUS

## 2018-05-07 MED ORDER — FENTANYL CITRATE (PF) 50 MCG/ML IJ SOLN (WRAP)
25.0000 ug | INTRAMUSCULAR | Status: DC | PRN
Start: 2018-05-07 — End: 2018-05-07
  Administered 2018-05-07 (×2): 25 ug via INTRAVENOUS

## 2018-05-07 MED ORDER — AMMONIA AROMATIC IN INHA
1.0000 | Freq: Once | RESPIRATORY_TRACT | Status: DC | PRN
Start: 2018-05-07 — End: 2018-05-08

## 2018-05-07 MED ORDER — NEOSTIGMINE METHYLSULFATE 1 MG/ML IJ/IV SOLN (WRAP)
Status: AC
Start: 2018-05-07 — End: ?
  Filled 2018-05-07: qty 5

## 2018-05-07 MED ORDER — MIDAZOLAM HCL 2 MG/2ML IJ SOLN
INTRAMUSCULAR | Status: DC | PRN
Start: 2018-05-07 — End: 2018-05-07
  Administered 2018-05-07: 2 mg via INTRAVENOUS

## 2018-05-07 MED ORDER — BUPIVACAINE-EPINEPHRINE (PF) 0.25% -1:200000 IJ SOLN
INTRAMUSCULAR | Status: DC | PRN
Start: 2018-05-07 — End: 2018-05-07
  Administered 2018-05-07: 10 mL via INTRAMUSCULAR

## 2018-05-07 MED ORDER — ACETAMINOPHEN 500 MG PO TABS
ORAL_TABLET | ORAL | Status: AC
Start: 2018-05-07 — End: ?
  Filled 2018-05-07: qty 2

## 2018-05-07 MED ORDER — ONDANSETRON HCL 4 MG/2ML IJ SOLN
4.0000 mg | Freq: Once | INTRAMUSCULAR | Status: DC | PRN
Start: 2018-05-07 — End: 2018-05-07

## 2018-05-07 MED ORDER — GABAPENTIN 100 MG PO CAPS
ORAL_CAPSULE | ORAL | Status: AC
Start: 2018-05-07 — End: ?
  Filled 2018-05-07: qty 1

## 2018-05-07 MED ORDER — AMMONIA AROMATIC IN INHA
1.0000 | Freq: Once | RESPIRATORY_TRACT | Status: DC | PRN
Start: 2018-05-07 — End: 2018-05-07

## 2018-05-07 MED ORDER — CEFAZOLIN SODIUM-DEXTROSE 2-3 GM-%(50ML) IV SOLR
INTRAVENOUS | Status: AC
Start: 2018-05-07 — End: ?
  Filled 2018-05-07: qty 50

## 2018-05-07 MED ORDER — GABAPENTIN 100 MG PO CAPS
100.0000 mg | ORAL_CAPSULE | Freq: Once | ORAL | Status: AC
Start: 2018-05-07 — End: 2018-05-07
  Administered 2018-05-07: 07:00:00 100 mg via ORAL

## 2018-05-07 MED ORDER — GLYCOPYRROLATE 0.2 MG/ML IJ SOLN
INTRAMUSCULAR | Status: DC | PRN
Start: 2018-05-07 — End: 2018-05-07
  Administered 2018-05-07: 0.4 mg via INTRAVENOUS
  Administered 2018-05-07: 0.1 mg via INTRAVENOUS

## 2018-05-07 MED ORDER — ENOXAPARIN SODIUM 40 MG/0.4ML SC SOLN
40.0000 mg | Freq: Once | SUBCUTANEOUS | Status: AC
Start: 2018-05-07 — End: 2018-05-07
  Administered 2018-05-07: 07:00:00 40 mg via SUBCUTANEOUS

## 2018-05-07 MED ORDER — FLUORESCEIN SODIUM 10 % IV SOLN
INTRAVENOUS | Status: DC | PRN
Start: 2018-05-07 — End: 2018-05-07
  Administered 2018-05-07: .2 mL via INTRAVENOUS

## 2018-05-07 MED ORDER — ROCURONIUM BROMIDE 50 MG/5ML IV SOLN
INTRAVENOUS | Status: AC
Start: 2018-05-07 — End: ?
  Filled 2018-05-07: qty 5

## 2018-05-07 MED ORDER — PROPOFOL 10 MG/ML IV EMUL (WRAP)
INTRAVENOUS | Status: DC | PRN
Start: 2018-05-07 — End: 2018-05-07
  Administered 2018-05-07: 50 mg via INTRAVENOUS
  Administered 2018-05-07: 200 mg via INTRAVENOUS

## 2018-05-07 MED ORDER — FENTANYL CITRATE (PF) 50 MCG/ML IJ SOLN (WRAP)
INTRAMUSCULAR | Status: AC
Start: 2018-05-07 — End: ?
  Filled 2018-05-07: qty 2

## 2018-05-07 SURGICAL SUPPLY — 113 items
APPLCATOR CHLORAPREP 26ML (Prep) ×4 IMPLANT
APPLICATOR ENDOSCOPIC 41CM (Hemostat)
APPLICATOR ENDOSCOPIC L41 CM (Hemostat) IMPLANT
APPLICATOR ENDOSCOPIC L41 CM NONREFLECTIVE CANNULA CANNULATED STYLET (Hemostat) IMPLANT
APPLICATOR ESCP SS FLSL 5MM 41CM LF STRL (Hemostat)
BAND AID STERILE 1X3 (Dressing) ×4 IMPLANT
BANDAGE STERI-STRIP 0.5X4IN (Dressing) ×1
CANULA STABILITY 5MM (Procedure Accessories) ×12 IMPLANT
CATHETER SURGICAL OD6 FR COLPO-PNEUMO (Procedure Accessories) ×3 IMPLANT
COVER HVYDTY REINF 65X90IN (Drape) ×1
COVER TABLE L90 IN X W65 IN REINFORCE (Drape) ×3 IMPLANT
COVER TABLE L90 IN X W65 IN REINFORCE HEAVY DUTY CONVERTORS POLY (Drape) ×3 IMPLANT
COVER TBL POLY CNVRT 90X65IN STRL REINF (Drape) ×3
DISSECTOR LAPAROSCOPIC L39 CM CURVE JAW (Procedure Accessories) ×3 IMPLANT
DISSECTOR LAPAROSCOPIC L39 CM CURVE JAW ULTRASONIC CORDLESS SONICISION (Procedure Accessories) ×3 IMPLANT
DISSECTOR LAPSCP SONICISION 39CM CRV JAW (Procedure Accessories) ×3
DISSECTOR SONICISION CRVE 39CM (Procedure Accessories) ×1
ELECTRODE ADULT PATIENT RETURN L9 FT REM POLYHESIVE ACRYLIC FOAM (Procedure Accessories) ×3 IMPLANT
ELECTRODE PATIENT RETURN L9 FT VALLEYLAB (Procedure Accessories) ×3 IMPLANT
ELECTRODE PT RTN RM PHSV ACRL FM C30- LB (Procedure Accessories) ×3
GLOVE SRG 8 BGL OPTIFIT ORTH LTX STRL PF (Glove) ×9
GLOVE SURG BIOGEL ORTHO SZ8 (Glove) ×3
GLOVE SURGICAL 8 BIOGEL OPTIFIT POWDER (Glove) ×9 IMPLANT
GLOVE SURGICAL 8 BIOGEL OPTIFIT POWDER FREE ROUGH BEAD CUFF (Glove) ×9 IMPLANT
GOWN SMART XLG (Gown)
GOWN SRG XL SMARTGOWN LF STRL LVL 4 (Gown)
GOWN SURGICAL XL SMARTGOWN LEVEL 4 (Gown) IMPLANT
GOWN SURGICAL XL SMARTGOWN LEVEL 4 BREATHABLE (Gown) IMPLANT
IRRIGATOR SUCTION ERGONOMIC HAND PIECE STRYKEFLOW II (Suction) ×3 IMPLANT
IRRIGATOR SUCTION STRYKEFLOW 2 (Suction) ×3 IMPLANT
IRRIGATOR SUCTN PUMP/HANDPIECE (Suction) ×1
KIT ADV LAPSCPC CARE (Kits) ×1
KIT CLOSURE PROCEDURE (Suture) ×4 IMPLANT
MANIPULATOR UTERINE OD6 FR COLPO-PNEUMO OCCLUDER SILICONE (Procedure Accessories) ×3 IMPLANT
OCCLUDER COLPO PNEUMO (Procedure Accessories) ×4
PACK TLH ONCOLOGY (Pack) ×4 IMPLANT
PAD ELECTROSRG GRND REM W CRD (Procedure Accessories) ×1
PAD SANITARY L12.25 IN X W4.25 IN HEAVY ABSORBENT MOISTURE BARRIER (Dressing) ×3 IMPLANT
PAD SNTR SLK FLF CRTY 12.25X4.25IN LF NS (Dressing) ×3 IMPLANT
PAD-PERI SANITARY REGULAR (Dressing) ×1
POUCH INSTRUMENT (Drape) ×4 IMPLANT
SCISSOR ENDOCUT DISP LAPO (Instrument) ×4 IMPLANT
SEALER DIVIDER LIGASURE BLUNT (Procedure Accessories) ×1
SEALER/DIVIDER LAPAROSCOPIC L37 CM 180 D (Procedure Accessories) ×3 IMPLANT
SEALER/DIVIDER LAPAROSCOPIC L37 CM 180 D L17.8 MM 2 ACTION JAW BLUNT (Procedure Accessories) ×3 IMPLANT
SEALER/DIVIDER LAPSCP 180D 17.8MM LGSR 5 (Procedure Accessories) ×3
SET IRR DEHP 10 GTT/ML STRG 81IN LF STRL (Tubing)
SET IRRIGATION L81 IN 10 GTT/ML STRAIGHT (Tubing) IMPLANT
SET IRRIGATION L81 IN 10 GTT/ML STRAIGHT NA DEHP BLADDER REGULATE (Tubing) IMPLANT
SLIPCOVER LAP-HUG-U-VAC LAP-S (Sterilization Supply) ×4 IMPLANT
SOL NACL .9% 1000ML LATEX (IV Solutions) ×1
SOL NACL.9% 1000ML IRR NONLTX (Irrigation Solutions) ×1
SOLUTION IRR 0.9% NACL 1000ML LF STRL (Irrigation Solutions) ×3
SOLUTION IRRIGATION 0.9% SODIUM CHLORIDE (Irrigation Solutions) ×3 IMPLANT
SOLUTION IRRIGATION 0.9% SODIUM CHLORIDE 1000 ML PLASTIC POUR BOTTLE (Irrigation Solutions) ×3 IMPLANT
SOLUTION IV 0.9% NACL 1000ML VFLX LF PLS (IV Solutions) ×3
SOLUTION IV 0.9% SODIUM CHLORIDE PVC (IV Solutions) ×3 IMPLANT
SOLUTION IV 0.9% SODIUM CHLORIDE PVC 1000 ML PH 5 PLASTIC CONTAINER (IV Solutions) ×3 IMPLANT
STRIP SKIN CLOSURE L4 IN X W1/2 IN (Dressing) ×3 IMPLANT
STRIP SKIN CLOSURE L4 IN X W1/2 IN REINFORCE STERI-STRIP POLYESTER (Dressing) ×3 IMPLANT
STRIP SKNCLS PLSTR STRSTRP 4X.5IN LF (Dressing) ×3
SUTURE ABS 0 CT1 VCL 27IN BRD COAT UD (Suture) ×3
SUTURE ABS 0 CT1 VCL 27IN CR BRD 8 STRN (Suture) ×3
SUTURE ABS 0 UR-6 VCL 27IN BRD COAT VIOL (Suture)
SUTURE ABS 3-0 PS2 MNCRL MTPS 27IN MFL (Suture) ×6
SUTURE COATED VICRYL 0 CT-1 L27 IN (Suture) ×3 IMPLANT
SUTURE COATED VICRYL 0 CT-1 L27 IN BRAID (Suture) ×3 IMPLANT
SUTURE COATED VICRYL 0 CT-1 L27 IN BRAID COATED UNDYED ABSORBABLE (Suture) ×3 IMPLANT
SUTURE COATED VICRYL 0 UR-6 L27 IN BRAID (Suture) IMPLANT
SUTURE MONOCRYL 3-0 PS-2 L27 IN (Suture) ×6 IMPLANT
SUTURE MONOCRYL 3-0 PS-2 L27 IN MONOFILAMENT UNDYED ABSORBABLE (Suture) ×6 IMPLANT
SUTURE MONOCRYL 3-0 PS2 27IN (Suture) ×2
SUTURE VICRYL 0 CT1 8X27IN (Suture) ×1
SUTURE VICRYL 0 UR6 27IN (Suture)
SUTURE VICRYL 0-0 CT1 (Suture) ×1
SUTURE VICRYL 012X18IN (Suture) IMPLANT
SYRINGE 50 ML GRADUATE NONPYROGENIC DEHP (Syringes, Needles) ×3 IMPLANT
SYRINGE 50 ML GRADUATE NONPYROGENIC DEHP FREE PVC FREE LOK MEDICAL (Syringes, Needles) ×3 IMPLANT
SYRINGE LUER LOK 50ML (Syringes, Needles) ×1
SYRINGE MED 50ML LL LF STRL GRAD N-PYRG (Syringes, Needles) ×3
SYSTEM IMAGING 8X6IN CLEARIFY MICROFIBER WARM HUB TRCR WIPE DSPSBL (Kits) ×3 IMPLANT
SYSTEM IMG MRFBR CLEARIFY 8X6IN WRM HUB (Kits) ×3 IMPLANT
TIP MANIPULATOR RUMI II OD5.1 MM (Procedure Accessories) IMPLANT
TIP MANIPULATOR RUMI II OD5.1 MM FLEXIBLE UTERINE L6CM LAVANDER (Procedure Accessories) IMPLANT
TIP MANIPULATOR RUMI II OD6.7 MM (Procedure Accessories) IMPLANT
TIP MANIPULATOR RUMI II OD6.7 MM FLEXIBLE UTERINE L10 CM GREEN (Procedure Accessories) IMPLANT
TIP MANIPULATOR RUMI II OD6.7 MM FLEXIBLE UTERINE L8 CM BLUE (Procedure Accessories) IMPLANT
TIP MANIPULATOR RUMI II OD6.7 MM UTERINE (Procedure Accessories) IMPLANT
TIP MANIPULATOR RUMI II OD6.7 MM UTERINE L6 CM WHITE (Procedure Accessories) IMPLANT
TIP MNPLT RUMI II 6.7MM 6CM LF STRL UT (Procedure Accessories)
TIP MNPLT RUMI II 6.7MM 8CM LF STRL FLXB (Procedure Accessories)
TIP MNPLT SS SIL RUMI II 5.1MM 6CM LF (Procedure Accessories)
TIP MNPLT SS SIL RUMI II 6.7MM 10CM LF (Procedure Accessories)
TIP RUMI 6.7MM X 10CM (Procedure Accessories)
TIP RUMI 6.7MM X 6CM (Procedure Accessories)
TIP RUMI 6.7MM X 8CM (Procedure Accessories)
TIP RUMI II INTUTRN 5.1MMX6CM (Procedure Accessories)
TOWEL L27 IN X W17 IN COTTON PREWASH (Other) ×3 IMPLANT
TOWEL L27 IN X W17 IN COTTON PREWASH DELINT HIGH ABSORBENT BLUE (Other) ×3 IMPLANT
TOWEL OR DISP 10PK (Other) ×1
TOWEL SRG CTTN 27X17IN LF STRL PREWASH (Other) ×3
TRAY SSTEP CATH UMETER 16FR (Tray) ×4 IMPLANT
TROCAR BLADELESS ENDO 5X100MM (Laparoscopy Supplies) ×3
TROCAR ENDO BLADELESS 11X100MM (Laparoscopy Supplies) IMPLANT
TROCAR LAPAROSCOPIC STABILITY SLVE (Laparoscopy Supplies) ×3
TROCAR LAPSCP EPTH XCL 5MM 100MM LF STRL (Laparoscopy Supplies) ×9 IMPLANT
TROCAR OD5 MM L100 MM BLADELESS STABLE SLEEVE ENDOPATH XCEL ENDOSCOPIC (Laparoscopy Supplies) ×9 IMPLANT
TUBE SET DISP HIGH FLOW (Tubing) ×1
TUBING BLADDER IRRIG (Tubing)
TUBING INSFL THRMPLST 45L PNEUMOSURE (Tubing) ×3
TUBING INSUFFLATION SET HIGH FLOW (Tubing) ×3 IMPLANT
TUBING INSUFFLATION SET HIGH FLOW TOUCHSCREEN PNEUMOSURE THERMOPLASTIC (Tubing) ×3 IMPLANT
TUBING SCT IRR (Suction) ×3

## 2018-05-07 NOTE — Anesthesia Postprocedure Evaluation (Signed)
Anesthesia Post Evaluation    Patient: [name] [name]    Procedure(s):  LAPAROSCOPIC, HYSTERECTOMY, TOTAL, BSO, LYSIS OF ADHESIONS  CYSTOSCOPY    Anesthesia type: general    Last Vitals:   Vitals Value Taken Time   BP 166/78 05/07/2018 11:40 AM   Temp 36.7 C (98 F) 05/07/2018 11:40 AM   Pulse 60 05/07/2018 11:40 AM   Resp 16 05/07/2018 11:40 AM   SpO2 95 % 05/07/2018 11:40 AM       Anesthesia Post Evaluation:     Patient Evaluated: PACU    Level of Consciousness: awake and alert    Pain Management: adequate    Airway Patency: patent    Anesthetic complications: No      PONV Status: none    Cardiovascular status: acceptable  Respiratory status: acceptable  Hydration status: acceptable        Not Complete    Signed by: Santiago Bumpers Alfie Alderfer, 05/07/2018 2:32 PM

## 2018-05-07 NOTE — Plan of Care (Signed)
Pt transferred from PACU. Pt was A&O x 4. VSS. Family in bedside. Pt walked 6 ft from stretcher to bed. Foley is intact. Done foley care. SCD on. No evidence of injury, fall mats down, call light within reach, bed in lowest position, bed alarm on, 3/4 side rails up, fall precautions are implemented as per protocol, purposeful rounding done.

## 2018-05-07 NOTE — Anesthesia Preprocedure Evaluation (Addendum)
Anesthesia Evaluation    AIRWAY    Mallampati: III    TM distance: >3 FB  Neck ROM: full  Mouth Opening:full  Planned to use difficult airway equipment: No CARDIOVASCULAR    cardiovascular exam normal       DENTAL        (+) lower dentures   PULMONARY    pulmonary exam normal     OTHER FINDINGS                  Relevant Problems   CARDIO   (+) Coronary artery disease   (+) Hypertension       PSS Anesthesia Comments: Borderline HTN, nl ECHO 2016, reportedly normal cath 2010 (h/o chest pain), nl recent ECG, medicallt cleared, increased BMI        Anesthesia Plan    ASA 3     general                                 informed consent obtained      pertinent labs reviewed             Signed by: Chrissie Noa A Prapti Grussing 05/07/18 5:46 AM

## 2018-05-07 NOTE — Progress Notes (Signed)
GYN ONC Post-Operative Check  Team Contact information      Name Number  Hours    1st Georgina Pillion ONC NP [facility] [phone] or [phone]  M- 11-9  Tues-thr 7-9   Fri 7-5   2nd CALL Resident [facility] [phone]  [phone] everyday   3rd CALL GYN ONC Fellow [facility] [phone]  [phone] weekdays    After HOURS     1st CALL   [facility] [id number] [phone] weeknights)   OR call [phone] weekends after hours     2nd CALL   Page [phone] for the MD on call      Date: 05/07/2018     Hospital day 0    Procedure(s):  LAPAROSCOPIC, HYSTERECTOMY, TOTAL, BSO, LYSIS OF ADHESIONS  CYSTOSCOPY    Day of Surgery  -------------------       Subjective:  Pt doing well post-operatively. Pain controlled with medication. Denies nausea/vomiting. Able to ambulate with assistance. Foley in place until ambulating more regularly.     Objective:     Temp:  [97.9 F (36.6 C)-98.6 F (37 C)] 97.9 F (36.6 C)  Heart Rate:  [53-70] 62  Resp Rate:  [16-18] 16  BP: (151-172)/(69-80) 172/80    No intake/output data recorded.  I/O this shift:  In: 1200 [I.V.:1200]  Out: 320 [Urine:300; Blood:20]        EXAM:  Gen: NAD - sleeping throughout most of exam    Neuro: A&Ox3     CV: RRR    Pulm: CTAB    Abd: Soft, appropriately tender     GU: Foley in place with clear/yellow urine     Incision: C/d/i    Ext: No calf tenderness, no edema     Skin: Warm, dry       Scheduled Meds:  Current Facility-Administered Medications   Medication Dose Route Frequency   . acetaminophen  650 mg Oral Q6H   . carvedilol  25 mg Oral Q12H SCH   . docusate sodium  100 mg Oral BID   . spironolactone  25 mg Oral QHS     Continuous Infusions:  . sodium chloride     . lactated ringers Stopped (05/07/18 0942)   . lactated ringers 50 mL/hr at 05/07/18 1057     PRN Meds:.ammonia, HYDROmorphone, ondansetron **OR** ondansetron, oxyCODONE **OR** oxyCODONE, prochlorperazine **OR** prochlorperazine, simethicone    Laboratory Results:  Results     Procedure Component Value Units  Date/Time    Type and Screen [[id number]] Collected:  05/07/18 0629    Specimen:  Blood Updated:  05/07/18 0726     ABO Rh O POS     AB Screen Gel NEG    Glucose Whole Blood - POCT [[id number]]  (Abnormal) Collected:  05/07/18 0932     Updated:  05/07/18 3557     POCT - Glucose Whole blood 116 mg/dL         Radiology Results:  Radiology Results (24 Hour)     ** No results found for the last 24 hours. **          Assessment:     62 y.o. POD#0 s/p LAPAROSCOPIC, HYSTERECTOMY, TOTAL, BSO, LYSIS OF ADHESIONS  CYSTOSCOPY    Plan:    Neuro: Tylenol ATC, Oxycodone/Dilaudid PRN     CV: No acute issues, VSS   Hx HTN - home medications held for the time. Will consider restarting in the event of hypertension    Pulm: No acute issues, encourage IS/OOB    GI/FEN:  Diet: Clear --> ADAT   IVF: NS @ 100 cc/hr   Daily labs: CBC, BMP   Anti-emetics: Zofran PRN N/V   Bowel regimen: Colace    GU: Foley in place over night   Baseline cr 1.14    Heme: Pre-op h/h 13.7/39.7, CBC in AM     ID: No acute issues     Endo: No acute issues     Dispo: Pending meeting post-op milestones     DVT Prophylaxis: SCDs  Antibiotics Indicated:  No  Foley Cath Removed:  No, will remove morning of post op day #1  Beta Blockers Indicated:  No    Faythe Dingwall, MD

## 2018-05-07 NOTE — Transfer of Care (Signed)
Anesthesia Transfer of Care Note    Patient: [name] [name]    Procedures performed: Procedure(s):  LAPAROSCOPIC, HYSTERECTOMY, TOTAL, BSO, LYSIS OF ADHESIONS  CYSTOSCOPY    Anesthesia type: General ETT    Patient location:Phase I PACU    Last vitals:   Vitals:    05/07/18 0951   BP: 157/75   Pulse: 63   Resp: 16   Temp: 36.7 C (98.1 F)   SpO2: 98%       Post pain: Patient not complaining of pain, continue current therapy      Mental Status:awake    Respiratory Function: tolerating face mask    Cardiovascular: stable    Nausea/Vomiting: patient not complaining of nausea or vomiting    Hydration Status: adequate    Post assessment: no apparent anesthetic complications    Signed by: Donnelly Stager  05/07/18 9:52 AM

## 2018-05-07 NOTE — UM Notes (Signed)
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

## 2018-05-07 NOTE — Brief Op Note (Signed)
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]    Postoperative Diagnosis:   Post-Op Diagnosis Codes:     * 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 Large mass on the fundus of the uterus :likely fibroid on frozen section. Normal appearing fallopian tubes and ovaries  Brisk bilateral ureteral jets seen on cystoscopy, no evidence of inadvertent cystotomy     Complications:   none      Signed by: Corinda Gubler, MD                                                                           [place] [facility]

## 2018-05-07 NOTE — Discharge Instr - AVS First Page (Addendum)
Instructions for after your discharge:      Post Anesthesia Discharge Instructions    Although you may be awake and alert in the recovery room, small amounts of anesthetic remain in your system for about 24 hours.  You may feel tired and sleepy during this time.      You are advised to go directly home from the hospital.    Plan to stay at home and rest for the remainder of the day.    It is advisable to have someone with you at home for 24 hours after surgery.    Do not operate a motor vehicle, or any mechanical or electrical equipment for the next 24 hours.      Be careful when you are walking around, you may become dizzy.  The effects of anesthesia and/or medications are still present and drowsiness may occur    Do not consume alcohol, tranquilizers, sleeping medications, or any other non prescribed medication for the remainder of the day.    Diet:  begin with liquids, progress your diet as tolerated or as directed by your surgeon.  Nausea and vomiting may occur in the next 24 hours.      [facility]   [facility]  [address], [address], [place], [place]  [address]  Phone: [phone]   Fax: [phone]      Discharge Instructions for Gynecologic Surgery  You had gynecologic surgery. This sheet contains information about what you can and can't do after your surgery. Remember, you need to take it easy.    Activity   Limit your activity for 4-6 weeks.   Don't lift anything heavier than 10-20 pounds.   Avoid strenuous activities, such as mowing the lawn, vacuuming, lifting laundry basket, or playing sports (no high impact or core exercises for 6 weeks)   Initiate your activity with short, slow walks. Gradually increase your pace and distance as you feel able. No limit on distance.    Listen to your body. If an activity causes pain, stop.   Don't drive for while requiring narcotic pain medicine or having significant discomfort. You  may ride in a car for short trips.   Rest when you are tired.    Don't have sexual intercourse or use tampons or douches until your doctor says it's safe to do so (usually at least 6 weeks after surgery)   No soaking in bathtub or swimming until cleared by your doctor    Home Care   Always keep your incision clean and dry.   Shower as needed. Wash your incision gently with mild soap and warm water and pat dry. Do not put lotions or ointments on these areas.   Use pain medication as prescribed   Use tylenol as needed    Check your temperature every day for 1 week(s) after your surgery.   Return to your diet as you feel able. Eat a healthy, well-balanced diet.    It is normal to have vaginal bleeding after this surgery (like a light period or spotting) for up to two weeks. You may then have bleeding again at 6 weeks when the sutures dissolve. You can wear a panty liner during this period (unscented). As the sutures dissolve, it is normal to have a watery yellow discharge with a musty odor. It is NOT normal to soak a pad an hour with blood, or to have a very foul smelling discharge. If you experience this, you  need to be evaluated immediately.    Do not drink alcohol while taking narcotics.    You may put an ice pack on your perineal area three times a day (wrapped in a towel, not directly on skin) the first week after surgery to help alleviate pain and swelling.     Avoid constipation.   Use laxatives or stool softeners as directed by your doctor. ( Some options include Colace, Miralax, Senna S or Pericolace)   Eat more high-fiber foods.   Drink 6-8 glasses of water every day, unless directed otherwise.    Follow-Up  Make a follow-up appointment as directed by our staff.    When to Call Your Doctor  Call your doctor right away if you have any of the following:   Fever above 101F or chills   Bright red vaginal bleeding or a smelly discharge   Vaginal bleeding that soaks more than one sanitary pad  per hour   Trouble urinating or burning sensation when you urinate   Severe abdominal pain or bloating   Redness, swelling, or drainage at your incision site   Shortness of breath   Vomiting   Severe constipation (Call if no flatus or BM in 72 hours after discharge from hospital)       Wishing you a Safe and Speedy recovery!!!!

## 2018-05-08 ENCOUNTER — Encounter (HOSPITAL_BASED_OUTPATIENT_CLINIC_OR_DEPARTMENT_OTHER): Payer: Self-pay | Admitting: Gynecologic Oncology

## 2018-05-08 DIAGNOSIS — D3911 Neoplasm of uncertain behavior of right ovary: Secondary | ICD-10-CM

## 2018-05-08 DIAGNOSIS — Z01818 Encounter for other preprocedural examination: Secondary | ICD-10-CM

## 2018-05-08 LAB — BASIC METABOLIC PANEL
BUN: 13 mg/dL (ref 7.0–19.0)
CO2: 22 mEq/L (ref 22–29)
Calcium: 8.9 mg/dL (ref 8.5–10.5)
Chloride: 107 mEq/L (ref 100–111)
Creatinine: 0.9 mg/dL (ref 0.6–1.0)
Glucose: 83 mg/dL (ref 70–100)
Potassium: 3.8 mEq/L (ref 3.5–5.1)
Sodium: 139 mEq/L (ref 136–145)

## 2018-05-08 LAB — CBC
Absolute NRBC: 0 10*3/uL (ref 0.00–0.00)
Hematocrit: 36.7 % (ref 34.7–43.7)
Hgb: 12 g/dL (ref 11.4–14.8)
MCH: 30.2 pg (ref 25.1–33.5)
MCHC: 32.7 g/dL (ref 31.5–35.8)
MCV: 92.2 fL (ref 78.0–96.0)
MPV: 12.6 fL — ABNORMAL HIGH (ref 8.9–12.5)
Nucleated RBC: 0 /100 WBC (ref 0.0–0.0)
Platelets: 117 10*3/uL — ABNORMAL LOW (ref 142–346)
RBC: 3.98 10*6/uL (ref 3.90–5.10)
RDW: 13 % (ref 11–15)
WBC: 7.09 10*3/uL (ref 3.10–9.50)

## 2018-05-08 LAB — LAB USE ONLY - HISTORICAL SURGICAL PATHOLOGY

## 2018-05-08 LAB — GFR: EGFR: >60

## 2018-05-08 MED ORDER — OXYCODONE HCL 5 MG PO TABS
5.0000 mg | ORAL_TABLET | ORAL | 0 refills | Status: AC | PRN
Start: 2018-05-08 — End: 2018-05-15

## 2018-05-08 MED ORDER — DSS 100 MG PO CAPS
100.00 mg | ORAL_CAPSULE | Freq: Two times a day (BID) | ORAL | 0 refills | Status: AC
Start: 2018-05-08 — End: ?

## 2018-05-08 NOTE — Discharge Instructions (Signed)
[facility]   [facility]  [address], [address], [place], [place]  [address]  Phone: [phone]   Fax: [phone]      Discharge Instructions for Gynecologic Surgery  You had gynecologic surgery. This sheet contains information about what you can and can't do after your surgery. Remember, you need to take it easy.    Activity  Limit your activity for 4-6 weeks.  Don't lift anything heavier than 10-20 pounds.  Avoid strenuous activities, such as mowing the lawn, vacuuming, lifting laundry basket, or playing sports (no high impact or core exercises for 6 weeks)  Initiate your activity with short, slow walks. Gradually increase your pace and distance as you feel able. No limit on distance.   Listen to your body. If an activity causes pain, stop.  Don't drive for while requiring narcotic pain medicine or having significant discomfort. You may ride in a car for short trips.  Rest when you are tired.   Don't have sexual intercourse or use tampons or douches until your doctor says it's safe to do so (usually at least 6 weeks after surgery)  No soaking in bathtub or swimming until cleared by your doctor    Home Care  Always keep your incision clean and dry.  Shower as needed. Wash your incision gently with mild soap and warm water and pat dry. Do not put lotions or ointments on these areas.  Use pain medication as prescribed  Use tylenol as needed   Check your temperature every day for 1 week(s) after your surgery.  Return to your diet as you feel able. Eat a healthy, well-balanced diet.   It is normal to have vaginal bleeding after this surgery (like a light period or spotting) for up to two weeks. You may then have bleeding again at 6 weeks when the sutures dissolve. You can wear a panty liner during this period (unscented). As the sutures dissolve, it is normal to have a watery yellow discharge with a musty odor. It is NOT normal to soak a  pad an hour with blood, or to have a very foul smelling discharge. If you experience this, you need to be evaluated immediately.   Do not drink alcohol while taking narcotics.   You may put an ice pack on your perineal area three times a day (wrapped in a towel, not directly on skin) the first week after surgery to help alleviate pain and swelling.    Avoid constipation.  Use laxatives or stool softeners as directed by your doctor. ( Some options include Colace, Miralax, Senna S or Pericolace)  Eat more high-fiber foods.  Drink 6-8 glasses of water every day, unless directed otherwise.    Follow-Up  Make a follow-up appointment as directed by our staff.    When to Call Your Doctor  Call your doctor right away if you have any of the following:  Fever above 101F or chills  Bright red vaginal bleeding or a smelly discharge  Vaginal bleeding that soaks more than one sanitary pad per hour  Trouble urinating or burning sensation when you urinate  Severe abdominal pain or bloating  Redness, swelling, or drainage at your incision site  Shortness of breath  Vomiting  Severe constipation (Call if no flatus or BM in 72 hours after discharge from hospital)       Wishing you a Safe and Speedy recovery!!!!

## 2018-05-08 NOTE — Plan of Care (Signed)
Pt a&ox4, f/c, mae. Ambulates with supervised assist, gait steady.  VSS ,R/A. Pt c/o mild abd pain managed with scheduled pain med. 5 lap sites to abd with steri strips d/I. Pt tolerates food well, good appetite. Fall precautions in place. Continue hourly rounding and awaiting [place] home this am.     Problem: Safety  Goal: Patient will be free from injury during hospitalization  Outcome: Completed  Flowsheets (Taken 05/08/2018 0541 by Newman Pies, RN)  Patient will be free from injury during hospitalization : Assess patient's risk for falls and implement fall prevention plan of care per policy;Include patient/ family/ care giver in decisions related to safety;Hourly rounding  Goal: Patient will be free from infection during hospitalization  Outcome: Completed     Problem: Pain  Goal: Pain at adequate level as identified by patient  Outcome: Completed  Flowsheets (Taken 05/08/2018 0541 by Newman Pies, RN)  Pain at adequate level as identified by patient: Identify patient comfort function goal;Assess for risk of opioid induced respiratory depression, including snoring/sleep apnea. Alert healthcare team of risk factors identified.;Assess pain on admission, during daily assessment and/or before any "as needed" intervention(s);Reassess pain within 30-60 minutes of any procedure/intervention, per Pain Assessment, Intervention, Reassessment (AIR) Cycle     Problem: Side Effects from Pain Analgesia  Goal: Patient will experience minimal side effects of analgesic therapy  Outcome: Completed     Problem: Discharge Barriers  Goal: Patient will be discharged home or other facility with appropriate resources  Outcome: Completed  Flowsheets (Taken 05/08/2018 0824)  Discharge to home or other facility with appropriate resources: Provide information on available health resources; Provide appropriate patient education; Initiate discharge planning     Problem: Psychosocial and Spiritual Needs  Goal: Demonstrates ability to cope  with hospitalization/illness  Outcome: Completed

## 2018-05-08 NOTE — Progress Notes (Signed)
Fill and pull done. instilled, voided . Post foley bladder scan .  MD notified and suggested to give pt more time.  2hrs later pt did not attempt to void.  Reinsert foley cath order given.

## 2018-05-08 NOTE — Final Progress Note (DC Note for stay less than 48 (Signed)
GYN ONC Post-Operative Check  Team Contact information      Name Number  Hours    1st Georgina Pillion ONC NP [facility] [phone] or [phone]  M- 11-9  Tues-thr 7-9   Fri 7-5   2nd CALL Resident [facility] [phone]  [phone] everyday   3rd CALL GYN ONC Fellow [facility] [phone]  [phone] weekdays    After HOURS     1st CALL   [facility] [id number] [phone] weeknights)   OR call [phone] ([phone] weekends after hours     2nd CALL   Page [phone] for the MD on call      Date: 05/08/2018     Hospital day 0    Procedure(s):  LAPAROSCOPIC, HYSTERECTOMY, TOTAL, BSO, LYSIS OF ADHESIONS  CYSTOSCOPY    1 Day Post-Op  -------------------       Subjective:  Pt doing well post-operatively. Pain controlled with medication. Denies nausea/vomiting. Able to ambulate with assistance. Foley was removed and she voided    Objective:     Temp:  [97.9 F (36.6 C)-98.9 F (37.2 C)] 98.4 F (36.9 C)  Heart Rate:  [53-74] 67  Resp Rate:  [16-20] 16  BP: (138-172)/(69-82) 150/82    I/O last 3 completed shifts:  In: 1200 [I.V.:1200]  Out: 670 [Urine:650; Blood:20]  I/O this shift:  In: -   Out: 930 [Urine:930]        EXAM:  Gen: NAD     Neuro: A&Ox3     CV: RRR    Pulm: CTAB    Abd: Soft, appropriately tenderness    Incision: incision mildly blood stained    Ext: No calf tenderness, no edema     Skin: Warm, dry       Scheduled Meds:  Current Facility-Administered Medications   Medication Dose Route Frequency   . acetaminophen  650 mg Oral Q6H   . carvedilol  25 mg Oral Q12H SCH   . docusate sodium  100 mg Oral BID   . spironolactone  25 mg Oral QHS     Continuous Infusions:  . sodium chloride 100 mL/hr at 05/08/18 0114     PRN Meds:.ammonia, HYDROmorphone, ondansetron **OR** ondansetron, oxyCODONE **OR** oxyCODONE, prochlorperazine **OR** prochlorperazine, simethicone    Laboratory Results:  Results     Procedure Component Value Units Date/Time    GFR [[id number]] Collected:  05/08/18 0319     Updated:  05/08/18 0422     EGFR >60.0    Basic  Metabolic Panel [[id number]] Collected:  05/08/18 0319    Specimen:  Blood Updated:  05/08/18 0422     Glucose 83 mg/dL      BUN 06.3 mg/dL      Creatinine 0.9 mg/dL      Calcium 8.9 mg/dL      Sodium 016 mEq/L      Potassium 3.8 mEq/L      Chloride 107 mEq/L      CO2 22 mEq/L     CBC without differential [[id number]]  (Abnormal) Collected:  05/08/18 0319    Specimen:  Blood Updated:  05/08/18 0404     WBC 7.09 x10 3/uL      Hgb 12.0 g/dL      Hematocrit 73.2 %      Platelets 117 x10 3/uL      RBC 3.98 x10 6/uL      MCV 92.2 fL      MCH 30.2 pg      MCHC 32.7 g/dL      RDW  13 %      MPV 12.6 fL      Nucleated RBC 0.0 /100 WBC      Absolute NRBC 0.00 x10 3/uL     Type and Screen [[id number]] Collected:  05/07/18 0629    Specimen:  Blood Updated:  05/07/18 0726     ABO Rh O POS     AB Screen Gel NEG    Glucose Whole Blood - POCT [[id number]]  (Abnormal) Collected:  05/07/18 3086     Updated:  05/07/18 5784     POCT - Glucose Whole blood 116 mg/dL         Radiology Results:  Radiology Results (24 Hour)     ** No results found for the last 24 hours. **          Assessment:     62 y.o. POD#1 s/p LAPAROSCOPIC, HYSTERECTOMY, TOTAL, BSO, LYSIS OF ADHESIONS  CYSTOSCOPY    Plan:    Neuro: Tylenol ATC, Oxycodone/Dilaudid PRN     CV: No acute issues, VSS   Hx HTN - home medications held for the time. Will consider restarting in the event of hypertension    Pulm: No acute issues, encourage IS/OOB    GI/FEN:   Diet: regular   IVF: NS @ 100 cc/hr -> SLIV   Daily labs: CBC, BMP   Anti-emetics: Zofran PRN N/V   Bowel regimen: Colace    GU: Foley was removed   Baseline cr 1.14 -> 0.9    Heme: Pre-op h/h 13.7/39.7 -> 12.0/36.7    ID: No acute issues     Endo: No acute issues     Dispo: discharge today since meeting milestones    DVT Prophylaxis: SCDs  Antibiotics Indicated:  No  Foley Cath Removed:  No, will remove morning of post op day #1  Beta Blockers Indicated:  No    Corinda Gubler, MD

## 2018-05-08 NOTE — Progress Notes (Signed)
Patient voided . MD notified and stated no need to replace foley cath.

## 2018-05-08 NOTE — Progress Notes (Addendum)
[name] [name] instructions given and explained, pt verbalized understanding, all questions answered, no needs. IV Rosh[place]'ed. Pt [name] [name]'ed home via wheelchair transport with family. No needs.

## 2018-05-08 NOTE — Plan of Care (Signed)
Problem: Safety  Goal: Patient will be free from injury during hospitalization  Outcome: Progressing  Flowsheets (Taken 05/08/2018 0541)  Patient will be free from injury during hospitalization : Assess patient's risk for falls and implement fall prevention plan of care per policy; Include patient/ family/ care giver in decisions related to safety; Hourly rounding  Goal: Patient will be free from infection during hospitalization  Outcome: Progressing  Flowsheets (Taken 05/08/2018 0541)  Free from Infection during hospitalization: Assess and monitor for signs and symptoms of infection; Monitor all insertion sites (i.e. indwelling lines, tubes, urinary catheters, and drains); Encourage patient and family to use good hand hygiene technique     Problem: Pain  Goal: Pain at adequate level as identified by patient  Outcome: Progressing  Flowsheets (Taken 05/08/2018 0541)  Pain at adequate level as identified by patient: Identify patient comfort function goal; Assess for risk of opioid induced respiratory depression, including snoring/sleep apnea. Alert healthcare team of risk factors identified.; Assess pain on admission, during daily assessment and/or before any "as needed" intervention(s); Reassess pain within 30-60 minutes of any procedure/intervention, per Pain Assessment, Intervention, Reassessment (AIR) Cycle  Patient Aox4.  VSS.  Pain controlled with tylenol. Medicated for nausea.  Walked in hallway. Tolerating regular diet.  IVF infusing.  Foley removed, voiding freely.  Abdominal laps with steri strips and bandages.  SCDs on bilaterally.  Encourage IS exercises.  Fall and safety precautions in place.  Call bell within reach. Pt aware to call for assistance as needed.  Will cont to monitor and reassess.

## 2018-05-11 NOTE — Op Note (Signed)
Procedure Date: 05/07/2018     Patient Type: A     SURGEON: Zackery Barefoot MD  ASSISTANT:       PREOPERATIVE DIAGNOSIS:  Adnexal mass.     POSTOPERATIVE DIAGNOSES:  1.  Bilateral hydrosalpinges.  2.  Leiomyoma uteri.     TITLE OF PROCEDURE:  1.  Total laparoscopic hysterectomy.  2.  Bilateral salpingo-oophorectomy.  3.  Resection and ablation of pelvic adhesive disease, rule out prior  endometriosis.  4.  Cystoscopy.     ANESTHETIC:  General.     DRAINS:  Foley to gravity.     ESTIMATED BLOOD LOSS:  200 mL.     DESCRIPTION OF PROCEDURE:  After the risks, benefits, indications, and alternatives of the procedure  were reviewed with the patient and informed consent obtained, she was taken  to the operating room where after adequate anesthesia was obtained, she was  prepped and draped in the usual sterile fashion in the low lithotomy  position for abdominopelvic surgery.  A Foley catheter and KOH uterine  manipulator were placed, then an orogastric tube by anesthesia, then a 5 mm  left upper quadrant incision was made, a 5 mm Ethicon Optiview port placed  under direct visualization, and a pneumoperitoneum obtained.  A  periumbilical 5 and lateral lower quadrant 5 mm ports were placed under  direct visualization, again after the pneumoperitoneum was obtained.  A  thorough review of the abdomen and pelvis was performed.  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   No other evidence of pelvic or upper abdominal pathology was appreciated.   The posterior cul-de-sac adhesive disease was taken down with monopolar  shears and ablated.  Now, the round ligaments were transected bilaterally  with the Harmonic scalpel.  The anterior and posterior leaves of the broad  ligament were incised anteriorly across midline and posteriorly and  parallel to the infundibulopelvic ligaments.  The bladder was taken down  sharply off the  lower uterine segment, cervix, and proximal vagina using  EndoShears.  With the ureters under direct visualization, the  infundibulopelvic ligaments were transected bilaterally with the Harmonic  scalpel.  The uterine arteries transected bilaterally with the Harmonic  scalpel.  The cardinal and uterosacral ligaments transected bilaterally  with the Harmonic scalpel.  The uterus, cervix, tubes, and ovaries were now  amputated along the KOH uterine manipulator and the specimen delivered  vaginally.  The vaginal cuff closed using interrupted sutures of 0 Vicryl.   A cystoscopy was performed, which confirmed bladder and ureteral integrity.   With hemostasis assured at all dissection sites, the pneumoperitoneum was  evacuated, the ports removed under direct visualization, the skin closed in  a subcuticular manner, and the patient escorted awake and in stable  condition, escorted via anesthesia to the recovery room.           D:  05/11/2018 06:26 AM by Dr. Zackery Barefoot, MD ([id number])  T:  05/11/2018 07:29 AM by NTS      Everlean Cherry: [id number]) (Doc ID: [id number])

## 2018-05-27 ENCOUNTER — Encounter: Payer: Self-pay | Admitting: Gynecologic Oncology

## 2018-10-13 ENCOUNTER — Other Ambulatory Visit: Payer: Self-pay | Admitting: Family Medicine

## 2019-01-04 ENCOUNTER — Other Ambulatory Visit: Payer: Self-pay | Admitting: *Deleted

## 2019-01-04 DIAGNOSIS — Z20822 Contact with and (suspected) exposure to covid-19: Secondary | ICD-10-CM

## 2019-01-07 NOTE — Addendum Note (Signed)
Addended by: Brigitte Pulse on: 01/07/2019 03:55 PM   Modules accepted: Orders

## 2019-01-30 DIAGNOSIS — Z1159 Encounter for screening for other viral diseases: Secondary | ICD-10-CM | POA: Diagnosis not present

## 2019-04-07 ENCOUNTER — Other Ambulatory Visit: Payer: Self-pay

## 2019-04-07 ENCOUNTER — Ambulatory Visit: Payer: BC Managed Care – PPO | Admitting: Family Medicine

## 2019-04-07 ENCOUNTER — Encounter: Payer: Self-pay | Admitting: Family Medicine

## 2019-04-07 VITALS — BP 122/74 | HR 71 | Temp 97.6°F | Resp 12 | Ht 63.0 in | Wt 150.4 lb

## 2019-04-07 DIAGNOSIS — E559 Vitamin D deficiency, unspecified: Secondary | ICD-10-CM

## 2019-04-07 DIAGNOSIS — Z23 Encounter for immunization: Secondary | ICD-10-CM

## 2019-04-07 DIAGNOSIS — E785 Hyperlipidemia, unspecified: Secondary | ICD-10-CM

## 2019-04-07 DIAGNOSIS — R7303 Prediabetes: Secondary | ICD-10-CM | POA: Diagnosis not present

## 2019-04-07 LAB — COMPREHENSIVE METABOLIC PANEL
ALT: 11 U/L (ref 0–35)
AST: 14 U/L (ref 0–37)
Albumin: 4.5 g/dL (ref 3.5–5.2)
Alkaline Phosphatase: 56 U/L (ref 39–117)
BUN: 10 mg/dL (ref 6–23)
CO2: 30 mEq/L (ref 19–32)
Calcium: 10.1 mg/dL (ref 8.4–10.5)
Chloride: 99 mEq/L (ref 96–112)
Creatinine, Ser: 0.85 mg/dL (ref 0.40–1.20)
GFR: 81.75 mL/min (ref >60.00)
Glucose, Bld: 95 mg/dL (ref 70–99)
Potassium: 4.4 mEq/L (ref 3.5–5.1)
Sodium: 138 mEq/L (ref 135–145)
Total Bilirubin: 0.5 mg/dL (ref 0.2–1.2)
Total Protein: 7.2 g/dL (ref 6.0–8.3)

## 2019-04-07 LAB — LIPID PANEL
Cholesterol: 222 mg/dL — ABNORMAL HIGH (ref 0–200)
HDL: 71.3 mg/dL (ref >39.00)
LDL Cholesterol: 134 mg/dL — ABNORMAL HIGH (ref 0–99)
NonHDL: 150.77
Total CHOL/HDL Ratio: 3
Triglycerides: 85 mg/dL (ref 0.0–149.0)
VLDL: 17 mg/dL (ref 0.0–40.0)

## 2019-04-07 LAB — HEMOGLOBIN A1C: Hgb A1c MFr Bld: 6.1 % (ref 4.6–6.5)

## 2019-04-07 LAB — VITAMIN D 25 HYDROXY (VIT D DEFICIENCY, FRACTURES): VITD: 11.37 ng/mL — ABNORMAL LOW (ref 30.00–100.00)

## 2019-04-07 MED ORDER — ATORVASTATIN CALCIUM 20 MG PO TABS: 20.0000 mg | ORAL_TABLET | Freq: Every day | ORAL | 2 refills | Status: DC

## 2019-04-07 NOTE — Patient Instructions (Addendum)
A few things to remember from today's visit:   Hyperlipidemia, unspecified hyperlipidemia type - Plan: Comprehensive metabolic panel, Lipid panel  Vitamin D deficiency - Plan: Comprehensive metabolic panel, VITAMIN D 25 Hydroxy (Vit-D Deficiency, Fractures)  Hyperglycemia - Plan: Hemoglobin A1c  Your blood sugar was elevated in the past, 145, I believe this was fasting. So checking for diabetes today.  Please be sure medication list is accurate. If a new problem present, please set up appointment sooner than planned today.

## 2019-04-07 NOTE — Progress Notes (Signed)
HPI:   Leslie Boyer is a 63 y.o. female, who is here today for chronic disease management. She was last seen on 03/21/2017. No new problem since her last visit.  Today she requesting refills on atorvastatin 20 mg. She been trying to be more consistent with following low-fat diet.   She has been under some stress at home,raising her grandchildren, so she has not been consistent with following a healthful diet.  She is reporting 20 pounds weight gain since her last visit.  She is not taking medication daily. Tolerating well with no side effects.  Denies severe/frequent headache, visual changes, chest pain, dyspnea, palpitation, claudication, focal weakness, or edema.  Lab Results  Component Value Date   CHOL 338 (H) 09/19/2016   HDL 59.60 09/19/2016   LDLCALC 253 (H) 09/19/2016   LDLDIRECT 224.3 06/08/2007   TRIG 123.0 09/19/2016   CHOLHDL 6 09/19/2016    In the past fasting glucose has been elevated at 145. No known history of DM 2. Denies abdominal pain, nausea,vomiting, polydipsia,polyuria, or polyphagia.  Lab Results  Component Value Date   HGBA1C 6.1 09/19/2016    Lab Results  Component Value Date   CREATININE 0.91 03/08/2017   BUN 13 03/08/2017   NA 136 03/08/2017   K 3.7 03/08/2017   CL 101 03/08/2017   CO2 27 03/08/2017   Vitamin D deficiency: Currently she is not on vitamin D supplementation. She completed ergocalciferol 50,000 units weekly in 2018.  Review of Systems  Constitutional: Positive for fatigue. Negative for activity change, appetite change, fever and unexpected weight change.  HENT: Negative for mouth sores, nosebleeds and trouble swallowing.   Eyes: Negative for pain and redness.  Respiratory: Negative for cough and wheezing.   Gastrointestinal:       Negative for changes in bowel habits.  Genitourinary: Negative for decreased urine volume and hematuria.  Musculoskeletal: Negative for gait problem and myalgias.  Skin:  Negative for rash and wound.  Neurological: Negative for syncope and facial asymmetry.  Rest see pertinent positives and negatives per HPI.  No current outpatient medications on file prior to visit.   No current facility-administered medications on file prior to visit.     Past Medical History:  Diagnosis Date  . 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 History of tobacco abuse   . Hyperlipidemia   . Osteopenia    Allergies  Allergen Reactions  . Sulfa Antibiotics Itching  . Sulfonamide Derivatives     REACTION: Hives  . Penicillins Rash    Social History   Socioeconomic History  . Marital status: Divorced    Spouse name: Not on file  . Number of children: Not on file  . Years of education: Not on file  . Highest education level: Not on file  Occupational History  . Not on file  Social Needs  . Financial resource strain: Not on file  . Food insecurity    Worry: Not on file    Inability: Not on file  . Transportation needs    Medical: Not on file    Non-medical: Not on file  Tobacco Use  . Smoking status: Former Research scientist (life sciences)  . Smokeless tobacco: Never Used  Substance and Sexual Activity  . Alcohol use: No    Alcohol/week: 0.0 standard drinks  . Drug use: No  . Sexual activity: Yes    Birth control/protection: Surgical  Lifestyle  . Physical activity    Days  per week: Not on file    Minutes per session: Not on file  . Stress: Not on file  Relationships  . Social Herbalist on phone: Not on file    Gets together: Not on file    Attends religious service: Not on file    Active member of club or organization: Not on file    Attends meetings of clubs or organizations: Not on file    Relationship status: Not on file  Other Topics Concern  . Not on file  Social History Narrative   Former Smoker   Alcohol use-no     Occupation:  [employer]     3 daughters   1 son   Divorced  (husband had drug problem)     Vitals:   04/07/19  1159  BP: 122/74  Pulse: 71  Resp: 12  Temp: 97.6 F (36.4 C)  SpO2: 98%   Body mass index is 26.64 kg/m.  Physical Exam  Nursing note and vitals reviewed. Constitutional: She is oriented to person, place, and time. She appears well-developed. No distress.  HENT:  Head: Normocephalic and atraumatic.  Mouth/Throat: Oropharynx is clear and moist and mucous membranes are normal.  Eyes: Pupils are equal, round, and reactive to light. Conjunctivae are normal.  Cardiovascular: Normal rate and regular rhythm.  No murmur heard. Pulses:      Dorsalis pedis pulses are 2+ on the right side and 2+ on the left side.  Respiratory: Effort normal and breath sounds normal. No respiratory distress.  GI: Soft. She exhibits no mass. There is no hepatomegaly. There is no abdominal tenderness.  Musculoskeletal:        General: No edema.  Lymphadenopathy:    She has no cervical adenopathy.  Neurological: She is alert and oriented to person, place, and time. She has normal strength. No cranial nerve deficit. Gait normal.  Skin: Skin is warm. No rash noted. No erythema.  Psychiatric: She has a normal mood and affect.  Well groomed, good eye contact.    ASSESSMENT AND PLAN:  Leslie Boyer was seen today for medication follow-up.  Diagnoses and all orders for this visit:  Lab Results  Component Value Date   CHOL 222 (H) 04/07/2019   HDL 71.30 04/07/2019   LDLCALC 134 (H) 04/07/2019   LDLDIRECT 224.3 06/08/2007   TRIG 85.0 04/07/2019   CHOLHDL 3 04/07/2019   Lab Results  Component Value Date   ALT 11 04/07/2019   AST 14 04/07/2019   ALKPHOS 56 04/07/2019   BILITOT 0.5 04/07/2019   Lab Results  Component Value Date   CREATININE 0.85 04/07/2019   BUN 10 04/07/2019   NA 138 04/07/2019   K 4.4 04/07/2019   CL 99 04/07/2019   CO2 30 04/07/2019    Hyperlipidemia, unspecified hyperlipidemia type Continue atorvastatin 20 mg daily. Continue working on low-fat diet. Further  recommendation will be given according to lipid panel results.  -     Comprehensive metabolic panel -     Lipid panel  Vitamin D deficiency We will recommend appropriate dose of vitamin D supplementation depending on 25 OH vitamin D results.  -     Comprehensive metabolic panel -     VITAMIN D 25 Hydroxy (Vit-D Deficiency, Fractures)  Prediabetes Strongly recommend working on a healthier lifestyle for primary prevention.  -     Hemoglobin A1c  Need for influenza vaccination -     Flu Vaccine QUAD 36+ mos IM  Return in about 6 months (around 10/05/2019) for before if needed depending on labs.   Ennis Delpozo G. Martin[place], MD  [facility]. [place] office.

## 2019-04-08 ENCOUNTER — Encounter: Payer: Self-pay | Admitting: Family Medicine

## 2019-05-03 ENCOUNTER — Encounter: Payer: Self-pay | Admitting: Family Medicine

## 2019-05-03 DIAGNOSIS — Z1231 Encounter for screening mammogram for malignant neoplasm of breast: Secondary | ICD-10-CM | POA: Diagnosis not present

## 2019-05-05 DIAGNOSIS — Z01419 Encounter for gynecological examination (general) (routine) without abnormal findings: Secondary | ICD-10-CM | POA: Diagnosis not present

## 2019-05-05 DIAGNOSIS — Z8042 Family history of malignant neoplasm of prostate: Secondary | ICD-10-CM | POA: Diagnosis not present

## 2019-05-05 DIAGNOSIS — Z6825 Body mass index (BMI) 25.0-25.9, adult: Secondary | ICD-10-CM | POA: Diagnosis not present

## 2019-05-05 DIAGNOSIS — Z8049 Family history of malignant neoplasm of other genital organs: Secondary | ICD-10-CM | POA: Diagnosis not present

## 2019-05-05 DIAGNOSIS — Z8041 Family history of malignant neoplasm of ovary: Secondary | ICD-10-CM | POA: Diagnosis not present

## 2019-05-10 LAB — HM DIABETES EYE EXAM

## 2019-05-17 ENCOUNTER — Encounter: Payer: Self-pay | Admitting: Family Medicine

## 2019-05-27 DIAGNOSIS — Z13820 Encounter for screening for osteoporosis: Secondary | ICD-10-CM | POA: Diagnosis not present

## 2019-06-04 ENCOUNTER — Other Ambulatory Visit: Payer: Self-pay

## 2019-06-04 DIAGNOSIS — Z20822 Contact with and (suspected) exposure to covid-19: Secondary | ICD-10-CM

## 2019-06-07 LAB — NOVEL CORONAVIRUS, NAA: SARS-CoV-2, NAA: NOT DETECTED

## 2019-06-09 DIAGNOSIS — M810 Age-related osteoporosis without current pathological fracture: Secondary | ICD-10-CM | POA: Diagnosis not present

## 2019-07-21 ENCOUNTER — Other Ambulatory Visit: Payer: Self-pay

## 2019-07-21 DIAGNOSIS — Z20822 Contact with and (suspected) exposure to covid-19: Secondary | ICD-10-CM

## 2019-07-23 LAB — NOVEL CORONAVIRUS, NAA: SARS-CoV-2, NAA: DETECTED — AB

## 2019-07-24 ENCOUNTER — Telehealth: Payer: Self-pay | Admitting: Adult Health

## 2019-07-24 ENCOUNTER — Telehealth: Payer: Self-pay | Admitting: Physician Assistant

## 2019-07-24 NOTE — Telephone Encounter (Signed)
Called patient and LMOM that I called to review their test results and to see how they are feeling.  Will try patient back later this morning.    Wilber Bihari, NP

## 2019-07-24 NOTE — Telephone Encounter (Signed)
Patient was contacted regarding positive covid 19 result. Pt is asymptomatic despite a fever and fatigue on Saturday. No shortness of breath. We went over CDC guidelines for quarantine and ER precautions.    Angelena Form PA-C  [facility]

## 2019-08-16 ENCOUNTER — Other Ambulatory Visit: Payer: Self-pay

## 2019-08-16 ENCOUNTER — Telehealth (INDEPENDENT_AMBULATORY_CARE_PROVIDER_SITE_OTHER): Payer: BC Managed Care – PPO | Admitting: Family Medicine

## 2019-08-16 ENCOUNTER — Encounter: Payer: Self-pay | Admitting: Family Medicine

## 2019-08-16 VITALS — Ht 63.0 in

## 2019-08-16 DIAGNOSIS — Z7189 Other specified counseling: Secondary | ICD-10-CM | POA: Diagnosis not present

## 2019-08-16 DIAGNOSIS — L989 Disorder of the skin and subcutaneous tissue, unspecified: Secondary | ICD-10-CM

## 2019-08-16 NOTE — Progress Notes (Signed)
Virtual Visit via Video Note   I connected with Leslie Boyer on 08/16/19 by a video enabled telemedicine application and verified that I am speaking with the correct person using two identifiers.  Location patient: home Location provider:work office Persons participating in the virtual visit: patient, provider  I discussed the limitations of evaluation and management by telemedicine and the availability of in person appointments. The patient expressed understanding and agreed to proceed.   Chief Complaint  Patient presents with  . patient complains of a knot on the left anterior shin x1 wee    HPI: Leslie Boyer is a 64 yo female with history of leg cramps, hyperlipidemia, and "clotting disorder" with above concern. Last week she noted a rounded lesion in pretibial area when she was rubbing her leg. It is not noted upon inspection. Problem is constant.  She has not noted any growth, pain, erythema, or edema on affected area. No hx of trauma.  She is concerned about this being a "blood clot." When asked about calf symptoms, she states that she had some discomfort yesterday, tightness.  She has not noted lower extremity edema or erythema.  No recent surgery or long travel. She is working from home, seated most of the time, she gets up to prepare meals. She is not on hormonal therapy.  Negative for fever, chills, CP, COPD, palpitations, abdominal pain, N/V,numbness,tingling,or focal weakness.  She also has questions about COVID-19 vaccination. She was diagnosed with COVID-19 infection early 07/2019. She had headache, fatigue, nasal congestion, rhinorrhea, and fever. Negative for changes in smell or taste. Most symptoms have resolved. Still have mild clear rhinorrhea. Negative for cough or wheezing.  She wants to know when she can have COVID-19 vaccine.  ROS: See pertinent positives and negatives per HPI.  Past Medical History:  Diagnosis Date  . 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 History of tobacco abuse   . Hyperlipidemia   . Osteopenia     Past Surgical History:  Procedure Laterality Date  . MULTIPLE TOOTH EXTRACTIONS  11/2011  . TOTAL ABDOMINAL HYSTERECTOMY      Family History  Problem Relation Age of Onset  . Ovarian cancer Mother        2008  . Osteoporosis Mother     Social History   Socioeconomic History  . Marital status: Divorced    Spouse name: Not on file  . Number of children: Not on file  . Years of education: Not on file  . Highest education level: Not on file  Occupational History  . Not on file  Tobacco Use  . Smoking status: Former Research scientist (life sciences)  . Smokeless tobacco: Never Used  Substance and Sexual Activity  . Alcohol use: No    Alcohol/week: 0.0 standard drinks  . Drug use: No  . Sexual activity: Yes    Birth control/protection: Surgical  Other Topics Concern  . Not on file  Social History Narrative   Former Smoker   Alcohol use-no     Occupation:  [employer]     3 daughters   1 son   Divorced  (husband had drug problem)    Social Determinants of Health   Financial Resource Strain:   . Difficulty of Paying Living Expenses: Not on file  Food Insecurity:   . Worried About Charity fundraiser in the Last Year: Not on file  . Ran Out of Food in the Last Year: Not on file  Transportation Needs:   .  Lack of Transportation (Medical): Not on file  . Lack of Transportation (Non-Medical): Not on file  Physical Activity:   . Days of Exercise per Week: Not on file  . Minutes of Exercise per Session: Not on file  Stress:   . Feeling of Stress : Not on file  Social Connections:   . Frequency of Communication with Friends and Family: Not on file  . Frequency of Social Gatherings with Friends and Family: Not on file  . Attends Religious Services: Not on file  . Active Member of Clubs or Organizations: Not on file  . Attends Archivist Meetings: Not on file  . Marital Status: Not on file   Intimate Partner Violence:   . Fear of Current or Ex-Partner: Not on file  . Emotionally Abused: Not on file  . Physically Abused: Not on file  . Sexually Abused: Not on file    Current Outpatient Medications:  .  atorvastatin (LIPITOR) 20 MG tablet, Take 1 tablet (20 mg total) by mouth daily., Disp: 90 tablet, Rfl: 2 .  VITAMIN D PO, Take by mouth., Disp: , Rfl:   EXAM:  VITALS per patient if applicable:Ht 5\' 3"  (1.6 m)   BMI 26.64 kg/m   GENERAL: alert, oriented, appears well and in no acute distress  HEENT: atraumatic, conjunctiva clear, no obvious abnormalities on inspection.  NECK: normal movements of the head and neck  LUNGS: on inspection no signs of respiratory distress, breathing rate appears normal, no obvious gross SOB, gasping or wheezing  CV: no obvious cyanosis  Leslie: moves all visible extremities without noticeable abnormality. No edema or erythema appreciated. She points of area of concern,pretibial distal aspect, I do not appreciate masses or skin changes.  PSYCH/NEURO: pleasant and cooperative, no obvious depression or anxiety, speech and thought processing grossly intact  ASSESSMENT AND PLAN:  Discussed the following assessment and plan:  Lesion of subcutaneous tissue Not visible on inspection,found incidentally.  Explained that history does not suggest a serious process. The likelihood of pretibial lesion being a "blood clot" is very low, although never 0. I do not think work-up is necessary at this time, if concern I offered a plain tibia/fibula x-ray but she agrees with holding on imaging for now. Continue monitoring for changes.  In regard to calf discomfort, it does not seem to be concerning at this time. She reports Hx of "blood clot", she is not having symptoms she had when she was dxed. We discussed signs and symptoms of DVT, she needs to seek medical attention if she notices any of these. She voices understanding and agrees with  plan.  Educated about COVID-19 virus infection All questions answered at the best of ability. I still recommend getting COVID-19 vaccine, she can do so in 2 to 3 months.    I discussed the assessment and treatment plan with the patient. Leslie Boyer was provided an opportunity to ask questions and all were answered. She agreed with the plan and demonstrated an understanding of the instructions.    Return if symptoms worsen or fail to improve.    Kalley Nicholl [place], MD

## 2019-11-16 ENCOUNTER — Other Ambulatory Visit: Payer: Self-pay

## 2019-11-17 ENCOUNTER — Encounter: Payer: Self-pay | Admitting: Family Medicine

## 2019-11-17 ENCOUNTER — Ambulatory Visit: Payer: BC Managed Care – PPO | Admitting: Family Medicine

## 2019-11-17 ENCOUNTER — Ambulatory Visit (INDEPENDENT_AMBULATORY_CARE_PROVIDER_SITE_OTHER): Payer: BC Managed Care – PPO | Admitting: Family Medicine

## 2019-11-17 VITALS — BP 122/76 | HR 66 | Temp 97.1°F | Resp 12 | Ht 63.0 in | Wt 145.5 lb

## 2019-11-17 DIAGNOSIS — E559 Vitamin D deficiency, unspecified: Secondary | ICD-10-CM | POA: Diagnosis not present

## 2019-11-17 DIAGNOSIS — S30860A Insect bite (nonvenomous) of lower back and pelvis, initial encounter: Secondary | ICD-10-CM

## 2019-11-17 DIAGNOSIS — W57XXXA Bitten or stung by nonvenomous insect and other nonvenomous arthropods, initial encounter: Secondary | ICD-10-CM | POA: Diagnosis not present

## 2019-11-17 DIAGNOSIS — R238 Other skin changes: Secondary | ICD-10-CM | POA: Diagnosis not present

## 2019-11-17 MED ORDER — DOXYCYCLINE HYCLATE 100 MG PO TABS: 200.0000 mg | ORAL_TABLET | Freq: Once | ORAL | 0 refills | Status: AC

## 2019-11-17 NOTE — Patient Instructions (Addendum)
A few things to remember from today's visit:   Tick bite, initial encounter - Plan: doxycycline (VIBRA-TABS) 100 MG tablet  Tick bite of lower back, initial encounter - Plan: B. burgdorfi antibodies  Vitamin D deficiency  Take vit D daily. Use repellent to prevent insects bite. Check skin daily if you go outdoors frequently.  If you need refills please call your pharmacy. Do not use My Chart to request refills or for acute issues that need immediate attention.    Please be sure medication list is accurate. If a new problem present, please set up appointment sooner than planned today.

## 2019-11-17 NOTE — Progress Notes (Signed)
ACUTE VISIT Chief Complaint  Patient presents with  . Tick Bites    been bitten several times by ticks   HPI: [name] is a 64 y.o. female, who is here today with above complaint. For the past 2 months she has had several tick bites. These have not been engorged or embedded. Most thicks has been on her for less than 36 hours, she is not sure about the one she found on her back about a month ago.  She has had local irritation and pruritus on bitten areas. Her granddaughter has helped her to remove ticks.  She has not noted fever, chills, fatigue, arthralgias/joint pain, or a skin erythematous rash. She has applied OTC hydrocortisone.  -She also noted a flat lesion on right hand about 5 days ago, 1 to 2 days after she noted small vesicle with some erythema.  Lesion is not painful, erythema has resolved, and there is no pruritus.  -She is not taking Vit D supplementation consistently. Last 25 OH vit D was on 11.3 in 03/2019.  Review of Systems  Constitutional: Negative for appetite change and chills.  HENT: Negative for congestion, ear pain, mouth sores, sneezing, sore throat, trouble swallowing and voice change.   Eyes: Negative for discharge and redness.  Respiratory: Negative for cough, shortness of breath and wheezing.   Cardiovascular: Negative for chest pain, palpitations and leg swelling.  Gastrointestinal: Negative for abdominal pain, diarrhea, nausea and vomiting.  Skin: Negative for wound.  Neurological: Negative for weakness, numbness and headaches.  Rest see pertinent positives and negatives per HPI.  Current Outpatient Medications on File Prior to Visit  Medication Sig Dispense Refill  . atorvastatin (LIPITOR) 20 MG tablet Take 1 tablet (20 mg total) by mouth daily. 90 tablet 2  . VITAMIN D PO Take by mouth.     No current facility-administered medications on file prior to visit.   Past Medical History:  Diagnosis Date  . Clotting disorder  ([place]) 2000   lower leg from injury  . History of tobacco abuse   . Hyperlipidemia   . Osteopenia    Allergies  Allergen Reactions  . Sulfa Antibiotics Itching  . Sulfonamide Derivatives     REACTION: Hives  . Penicillins Rash    Social History   Socioeconomic History  . Marital status: Divorced    Spouse name: Not on file  . Number of children: Not on file  . Years of education: Not on file  . Highest education level: Not on file  Occupational History  . Not on file  Tobacco Use  . Smoking status: Former Research scientist (life sciences)  . Smokeless tobacco: Never Used  Substance and Sexual Activity  . Alcohol use: No    Alcohol/week: 0.0 standard drinks  . Drug use: No  . Sexual activity: Yes    Birth control/protection: Surgical  Other Topics Concern  . Not on file  Social History Narrative   Former Smoker   Alcohol use-no     Occupation:  [employer]     3 daughters   1 son   Divorced  (husband had drug problem)    Social Determinants of Health   Financial Resource Strain:   . Difficulty of Paying Living Expenses:   Food Insecurity:   . Worried About Charity fundraiser in the Last Year:   . Arboriculturist in the Last Year:   Transportation Needs:   . Film/video editor (Medical):   Marland Kitchen  Lack of Transportation (Non-Medical):   Physical Activity:   . Days of Exercise per Week:   . Minutes of Exercise per Session:   Stress:   . Feeling of Stress :   Social Connections:   . Frequency of Communication with Friends and Family:   . Frequency of Social Gatherings with Friends and Family:   . Attends Religious Services:   . Active Member of Clubs or Organizations:   . Attends Archivist Meetings:   Marland Kitchen Marital Status:     Vitals:   11/17/19 0843  BP: 122/76  Pulse: 66  Resp: 12  Temp: (!) 97.1 F (36.2 C)  SpO2: 98%   Body mass index is 25.77 kg/m.  Physical Exam  Nursing note and vitals reviewed. Constitutional: She is oriented to person, place, and  time. She appears well-developed. No distress.  HENT:  Head: Normocephalic and atraumatic.  Mouth/Throat: Oropharynx is clear and moist and mucous membranes are normal.  Eyes: Conjunctivae are normal.  Cardiovascular: Normal rate and regular rhythm.  Respiratory: Effort normal and breath sounds normal. No respiratory distress.  Musculoskeletal:        General: No edema.  Lymphadenopathy:    She has no cervical adenopathy.  Neurological: She is alert and oriented to person, place, and time.  Skin: Skin is warm. Rash noted.     No erythematous skin lesion. Right hand between thumb and index finger a 2 mm soft vesicular lesion,not tender and not erythematous. On areas affected by tick bites there are papular,non erythematous lesions,hyperpigmented, 1-2 mm,no tender.  Psychiatric: She has a normal mood and affect. Her speech is normal.  Well groomed, good eye contact.    ASSESSMENT AND PLAN:  [name] was seen today for tick bites.  Diagnoses and all orders for this visit:  Tick bite of lower back, initial encounter Recommend prevention with big hot,long sleeves,and repellent when working outdoors. Prophylactic treatment with Doxycycline 200 mg x 1.  -     B. burgdorfi antibodies -     doxycycline (VIBRA-TABS) 100 MG tablet; Take 2 tablets (200 mg total) by mouth once for 1 dose.  Vitamin D deficiency Recommend trying to be more compliant with vitamin D supplementation. We will plan on checking next visit.  Vesicular lesion She has been cooking lately so there is a possibility that this may be caused by a drop of hot oil. Other possible etiology discussed, it does not seem to be infectious. Recommend continue monitoring for changes.   Return if symptoms worsen or fail to improve.   Mekhi Lascola G. [place], MD  [facility]. [place] office.  Discharge Instructions   None     A few things to remember from today's visit:   Tick bite, initial encounter -  Plan: doxycycline (VIBRA-TABS) 100 MG tablet  Tick bite of lower back, initial encounter - Plan: B. burgdorfi antibodies  Vitamin D deficiency  Take vit D daily. Use repellent to prevent insects bite. Check skin daily if you go outdoors frequently.  If you need refills please call your pharmacy. Do not use My Chart to request refills or for acute issues that need immediate attention.    Please be sure medication list is accurate. If a new problem present, please set up appointment sooner than planned today.

## 2019-11-18 ENCOUNTER — Encounter: Payer: Self-pay | Admitting: Family Medicine

## 2019-11-18 LAB — B. BURGDORFI ANTIBODIES: B burgdorferi Ab IgG+IgM: <0.9 index

## 2019-12-01 ENCOUNTER — Other Ambulatory Visit: Payer: Self-pay | Admitting: Obstetrics & Gynecology

## 2019-12-01 DIAGNOSIS — Z1231 Encounter for screening mammogram for malignant neoplasm of breast: Secondary | ICD-10-CM

## 2019-12-10 ENCOUNTER — Other Ambulatory Visit: Payer: Self-pay | Admitting: Obstetrics & Gynecology

## 2019-12-10 ENCOUNTER — Ambulatory Visit: Payer: Commercial Managed Care - POS | Attending: Obstetrics & Gynecology

## 2019-12-10 DIAGNOSIS — Z1231 Encounter for screening mammogram for malignant neoplasm of breast: Secondary | ICD-10-CM

## 2019-12-10 DIAGNOSIS — R928 Other abnormal and inconclusive findings on diagnostic imaging of breast: Secondary | ICD-10-CM | POA: Insufficient documentation

## 2019-12-14 ENCOUNTER — Other Ambulatory Visit: Payer: Self-pay | Admitting: Obstetrics & Gynecology

## 2019-12-14 DIAGNOSIS — R928 Other abnormal and inconclusive findings on diagnostic imaging of breast: Secondary | ICD-10-CM

## 2020-01-07 ENCOUNTER — Ambulatory Visit: Payer: Commercial Managed Care - POS

## 2020-01-11 ENCOUNTER — Other Ambulatory Visit: Payer: Self-pay | Admitting: Obstetrics & Gynecology

## 2020-01-25 ENCOUNTER — Other Ambulatory Visit (INDEPENDENT_AMBULATORY_CARE_PROVIDER_SITE_OTHER): Payer: Self-pay | Admitting: Surgery

## 2020-01-25 DIAGNOSIS — D0512 Intraductal carcinoma in situ of left breast: Secondary | ICD-10-CM

## 2020-01-25 HISTORY — DX: Intraductal carcinoma in situ of left breast: D05.12

## 2020-01-25 HISTORY — PX: EXCISION BIOPSY WITH NEEDLE LOCALIZATION: SHX2709

## 2020-01-26 ENCOUNTER — Other Ambulatory Visit: Payer: Self-pay

## 2020-01-26 ENCOUNTER — Ambulatory Visit
Admission: RE | Admit: 2020-01-26 | Discharge: 2020-01-26 | Disposition: A | Discharge status: Home / Self Care | Payer: Self-pay | Source: Ambulatory Visit

## 2020-01-26 DIAGNOSIS — Z1239 Encounter for other screening for malignant neoplasm of breast: Secondary | ICD-10-CM

## 2020-02-08 ENCOUNTER — Other Ambulatory Visit: Payer: Self-pay | Admitting: Family Medicine

## 2020-02-08 DIAGNOSIS — E785 Hyperlipidemia, unspecified: Secondary | ICD-10-CM

## 2020-03-22 ENCOUNTER — Telehealth: Payer: Self-pay

## 2020-03-22 NOTE — Telephone Encounter (Signed)
[facility] New Patient Coordinator Note    Physician/Location Preference:    Location Preference: [facility]     Physician Preference: Dr. Cyndi Bender    Referral:    Referring Provider: Rosann Auer[employer]    Is Referral required per insurance? No      History:    Personal Hx of Cancer: No     Prior Chemotherapy - No   Prior Radiation - No    Prior Surgery related to Cancer - No    Family Hx of Cancer : Yes - Mother - breast cancer, melanoma     Biopsy History:    Yes - Location Performed: [facility])    Imaging History:    Prior Imaging: Yes     Type of Imaging: Mammogram   Location Performed: [facility]    Other:     Are there patient owned records that will be brought to the first appointment?No    Has the Appointment been scheduled? Yes 9/23 with Dr. Cyndi Bender

## 2020-03-29 ENCOUNTER — Encounter: Payer: Self-pay | Admitting: Surgery

## 2020-04-04 ENCOUNTER — Telehealth (HOSPITAL_BASED_OUTPATIENT_CLINIC_OR_DEPARTMENT_OTHER): Payer: Self-pay

## 2020-04-04 ENCOUNTER — Other Ambulatory Visit (HOSPITAL_BASED_OUTPATIENT_CLINIC_OR_DEPARTMENT_OTHER): Payer: Self-pay | Admitting: Surgery

## 2020-04-04 DIAGNOSIS — D0512 Intraductal carcinoma in situ of left breast: Secondary | ICD-10-CM | POA: Insufficient documentation

## 2020-04-04 NOTE — Telephone Encounter (Signed)
See phone note

## 2020-04-05 NOTE — Progress Notes (Signed)
[facility] [place] Office  [phone]    Consultation Report     Treatment Team  Ref Prov: Terrilee Files, MD Primary Care Physician :   Terrilee Files, MD Radiology facility: [facility])--[phone] Breast Surgeon : Henderson Baltimore MD  [phone]   Plastic Surgeon none Radiation Onc:  Miguel Dibble MD:  [phone] [facility] [place]); Medical Onc:  Laney Pastor, MD [phone] [facility] [place])      Breast Cancer Comprehensive Care Plan Summary  Date of diagnosis: 01/25/2020 Event : DCIS ECOG Performance: Grade 0  (Fully active) Genetic Testing :genetics consultation requested   Presentation : Abnormal mammogram with microcalcifications Side: left Focality :  unifocal Location: lateral region   Biologic Tumor characteristics  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  Ki-67:  DCIS - not indicated Oncotype Dx:  pending discussion with oncology   Estrogen Receptor: positive >90 % Progesterone Receptor: positive >90 % Her-2-neu Receptor: DCIS, not indicated    Staging  Tumor Size:   DCIS Nodes: clinically/US negative Systemic Metastases: clinically negative Stage: Stage 0 , Tis N0 M0   Treatment  Modality Date    Surgery   Planned Left and partial mastectomy with ultrasound guided needle localization   Radiation     Endocrine     Chemotherapy Not indicated Not indicated   Biologic Not indicated Not indicated   Clinical trial     Chief complaint : New Diagnosis of DCIS    History of Present Illness    [name] is a 64 y.o. 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     She underwent the following workup.  Imaging  Study Date Location Results   Diagnostic Mammo/  Ultrasound 01/11/2020 [facility]  Therearefaintpunctatecalcificationsintheupperouterquadrantmeasuringuptoapproximately1.2cminAPdimensionwithpossiblebranching.Additionalscatteredbenign-appearingcalcificationsnoted.   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 She also denies any history of unexplained bleeding diathesis. All other systems were reviewed and are negative.      Past Medical History  The patient   has a past medical history of Coronary artery disease, non-occlusive (2010), Fibroid, uterine (05/2012), H/O echocardiogram (06/28/13), H/O mammogram (03/15/2102 ), Hypertensive disorder (2004), Left ventricular hypertrophy (2010), Malignant neoplasm (01/25/2020), Ovarian cyst, right (05/2012), Pap smear for cervical cancer screening (07/25/2101), and Post-operative nausea and vomiting.    Past Surgical History  The patient  has a past surgical history that includes Cardiac catheterization (2010); Tubal ligation (1984); Inguinal hernia repair (2004); Myomectomy (04/2014); LAPAROSCOPIC, HYSTERECTOMY, TOTAL, BSO (Bilateral, 05/07/2018); CYSTOSCOPY (Bilateral, 05/07/2018); LAPAROSCOPIC, LYSIS, ADHESIONS (05/07/2018); Abdominal surgery (1999); Excision Biopsy with Needle Localization (01/25/2020); Oophorectomy (05/08/2019); and Hysterectomy (05/08/2019).    Medications  The patient's   Current Outpatient Medications:     Calcium-Magnesium 500-250 MG Tab, Take 1 tablet by mouth daily., Disp: , Rfl:     carvedilol (COREG) 25 MG tablet, Take 2 tablets (50 mg  total) by mouth 2 (two) times daily with meals.  (Patient taking differently: Take 25 mg by mouth 2 (two) times daily with meals  ), Disp: 120 tablet, Rfl: 2    Cholecalciferol (VITAMIN D) 2000 UNITS Cap, Take 2,000 Unit by mouth daily., Disp: , Rfl:     docusate sodium (COLACE) 100 MG capsule, Take 1 capsule (100 mg total) by mouth 2 (two) times daily, Disp: 30 capsule, Rfl: 0    irbesartan-hydroCHLOROthiazide (AVALIDE) 300-12.5 MG per tablet, Take 1 tablet by mouth nightly, Disp: , Rfl:     spironolactone (ALDACTONE) 50 MG tablet, Take 1 tablet (50 mg total) by mouth daily. (Patient taking differently: Take 25 mg by mouth nightly  ), Disp: 30 tablet, Rfl: 6    Allergies / sensitivities and intolerance   Aspirin, Pravastatin, Levaquin [levofloxacin hemihydrate], Other, Sudafed [pseudoephedrine], and Tetanus immune globulin    Social History  The patient  reports that she has never smoked. She has never used smokeless tobacco. She reports current alcohol use of about 1.0 standard drinks of alcohol per week. She reports that she does not use drugs.    Family History  The patient's family history includes Bone cancer in her mother; Breast cancer in her sister; Breast cancer (age of onset: 14) in her mother; Cancer in her mother; Heart failure in her brother and sister; Ovarian cancer in her maternal grandmother.    Gyn History    Gynecological History   Age of menarche:: 36   Breast pain:: No     Age of menopause:: 89   Birth Control pills:: No     Birth Control pills:: No   G:: 3     P:: 2   Hormone Replacement therapy:: No     Age at first delivery:: 61      Bra size:: 42DD      Last mammogram:: 01/25/20   Marital status:: Married     Nipple discharge:: No            Physical Exam   BP 161/88 (BP Site: Left arm, Patient Position: Sitting)    Pulse 70    Temp 97.3 F (36.3 C) (Temporal)    Resp 18    Ht 1.626 m (5\' 4" )    Wt 101.7 kg (224 lb 3.2 oz)    BMI 38.48 kg/m     WDWN female in NAD  HEENT: Eyes- Clear, no scleral icterus,      Ears/Nose- WNL  Hearing-WNL      Mouth-WNL  Neck:  No thyromegaly or masses, trachea midline  Chest: Respiratory effort normal   Palpation of the chest wall: No tenderness, asymmetry, or crepitus   Lungs-Clear to ascultation   CV:  RRR without murmurs or gallops, no pedal edema  BJM:  Gait and station normal  Ext:  No CCE,   Upper Extremity Range of Motion: WNL   Skin:  Free of significant ulcers or lesions  Neuro:  Grossly intact, no focal findings, alert and oriented, asks appropriate questions  LN:  No axillary, supraclavicular or cervical adenopathy  Breast:    Symmetry:Symmetric   Ptosis grade  3     Right breast:     Presence:Present    Skin:Skin color, texture, turgor normal. No rashes or lesions.     Nipple:normal    Tissue: Within normal limits     Left breast:     Presence:Present    Skin:Skin color, texture, turgor normal. No rashes or lesions.  Nipple:normal    Tissue: Within normal limits  Assessment    This is a 64 y.o.  Post-menopausal female patient who has a new diagnosis of ductal carcinoma in situ of the left breast.   Based on clinical and imaging assessment, the anatomic extent is Tis N0 M0, Stage 0.      I have explained that Ductal Carcinoma In situ is considered a pre-invasive cancer. It is a clonal proliferation of malignant-looking cells in the lining of a breast duct without evidence of spread outside the duct. DCIS is considered the earliest form of breast cancer. If untreated, DCIS may progress to invasive breast cancer. DCIS is highly curable with expected survival rates of 97 to 99 %.    I have also explained the possibility of upstaging to invasive ductal carcinoma on final pathologic analysis of the entire lesion after complete surgical resection. The risk of upstaging varies from 10 - 50 % depending on the characteristics of the tumor, type of initial  biopsy, aggressiveness of cells and extent of the lesion.  If invasive carcinoma is detected on final pathology, we will reassess the prognosis  and treatment recommendations.     ------ MRI in local staging ------  The patient's mammogram demonstrates dense breasts that may limit the ability to assess the true extent of breast cancer. I have discussed the role of breast MRI in order to define the loco-regional extent of her breast cancer, determine the presence of multicentric disease and evaluate the contralateral breast.  I have explained the higher incidence of false positive findings on MRI and need for MRI biopsy in case of detection of any abnormality on MRI. It is unknown whether small cancers detected on MRI have clinical significance and impact the prognosis or outcomes. We have recommended against MRI considering the details of  her case.    ------ Genetics -------    We have explained that mutations are changes in the genetic code of a gene that affect its function. Inherited gene mutations can be passed on from a parent to a child. Some inherited gene mutations increase breast cancer risk. BRCA1 and BRCA2 are the best-known genes linked to breast cancer. Only 5 - 10 % of patients with breast cancer have a familial mutation as the cause of the cancer.    I have recommended genetic consultation for measurement of genetic risk and BRCA testing due to her family history. I have explained that mutation in the BRCA1 or BRCA2 genes can lead up to 60 - 80 % risk of breast cancer and up to 20 - 44 % risk of ovarian cancer by age 26.     ------Surgical Candidacy------    The patient is a surgical candidate, but with additional risks.  These include  cardiac disease    Comorbidities include:cardiac disease      The risks and complications associated with surgery in general were discussed, including but not limited to bleeding, infection, reaction to anesthesia, deep venous thrombosis, pneumonia, pulmonary embolus and death.     Treatment     I have explained the prognostic characteristics of this tumor based on its biologic characteristics and stage.  I  have introduced the concept of a multidisciplinary approach to her treatment, including surgical and possibly medical radiation treatment. Because DCIS is not invasive, treatments are largely directed to local management.     ------Surgery ------    Amongst the options for surgery, we have discussed breast conservation surgery versus mastectomy and mastectomy  with reconstruction.  Breast conservation surgery is used in combination with radiation treatment.  I have explained that in appropriately selected patients these options provide similar outcomes in terms of local-regional control and survival of breast cancer. We take into account the size of the tumor relative to the size of the breast in order to provide the best margins and the best cosmetic outcome.     Ms. Schimpf is an excellent candidate for breast conservation. She is interested in this approach.        ----- Technical aspects of surgery and reconstruction options ------  In breast conservation surgery the goal is to remove the tumor with a surrounding rim of normal tissue, usually a minimum 1-2 mm distance from the tumor to the margin of resection. If the original surgery provides a positive or a narrow margin then consideration is given to a second surgery in order to obtain negative margins. Rarely, a mastectomy may be required (in a subsequent surgery) if the initial surgical procedures do not achieve negative margins. Mild pain, ecchymosis and a gentle scar are common after lumpectomy., I anticipate a moderate volume of breast tissue will be removed with the lumpectomy and it is likely to  cause distortion of the breast size and shape. I will sculpt and realign the remaining breast tissue to restore a natural appearance to the breast shape.    1. Localization of the clip-  I will do intraoperative localization with ultrasound.  The risks include bleeding, infection and need for further surgery.     2. Pain control- We have discussed pain control  options including the importance of a surgical bra.  The majority of patients do well with NSAIDS and tylenol based on ERAS concepts.  Will prescribe roxicodone as needed.  I have answered all her questions.     3. Oncoplastic tissue rearrangement- I anticipate a moderate volume of breast tissue will be removed with the lumpectomy and it is likely to  cause distortion of the breast size and shape. I will sculpt and realign the remaining breast tissue to restore a natural appearance to the breast shape.     4. Risks- Mild pain, ecchymosis and an incision related scare are common after lumpectomy.  Additional potential complications include but not exclusively anesthetic complications, infection, scar tissue, clip retention, fluid retention,  bleeding requiring surgical evacuation, DVT/PE, pneumonia, and recurrence.    ----- Lymph node management ------    We reviewed  the significance of loco-regional disease and staging.  Tumor spread to the axillary lymph nodes was discussed; an anatomic drawing was used to clarify this.     We recommended against axillary staging because her tumor is non-invasive and the patient is being treated with lumpectomy.     ------ Medical Oncology ------    In the context of her multidisciplinary care, the patient  may benefit from adjuvant medical treatment to decrease the risk of recurrence (systemic and local). Decisions about medical treatments are based on the biologic characteristics and extent of the cancer.     This DCIS  expresses receptors for estrogen. I have recommended consultation with a medical oncologist for consideration of adjuvant endocrine treatment with either Tamoxifen or an Aromatase inhibitor. This may be done once the full analysis from pathology is available.     ----- Radiotherapy ------    Radiation therapy uses high-energy rays or particles that destroy cancer cells. In DCIS, radiotherapy may decrease the risk of recurrence by approximately half with the  addition of adjuvant radiotherapy to the breast conservation surgery.  Selected cases of ductal carcinoma in situ may not experience a significant benefit from radiotherapy. The role of radiotherapy will be better defined once the full analysis from pathology is available in consultation with radiation oncology.  Marland Kitchen    --------Medical Comorbidities----------------  History of cardiac disease    PLAN    Summary of Treatment Plan  Schedule for a left partial segmental mastectomy with ultrasound needle localization  Consultation with medical oncology to discuss the risks and benefits of  adjuvant endocrine therapy  Consultation with radiation oncology to discuss the risks and benefits of radiation therapy  Genetic counseling  I will present her case at our multidisclipinary tumor board    -----Resources------------  *The patient has been given the contact information for "Life with Cancer".  *The patient has been provided with a list of web based information sources including the NCCN guidelines (www.nccn.org) for the treatment of breast cancer.  *The patient also met with the [facility] breast navigator, for additional psychosocial support    PRESURGICAL CHECKLIST    Allergies:   Allergies   Allergen Reactions    Aspirin Shortness Of Breath and Rash    Pravastatin Other (See Comments)     Myalgias    Levaquin [Levofloxacin Hemihydrate]      Joint pains    Other Swelling     Antihistamines    Sudafed [Pseudoephedrine] Swelling    Tetanus Immune Globulin      Skin reaction       Antibiotics: Ancef 2 gram IV on call       Medical clearance:None    DVT Screen:  Patients undergoing breast cancer surgery are at increased risk of developing DVT's and pulmonary embolisms.      Each Risk Factor Represents 1 Point.    This patient has:  Lumpectomy/excisional biopsy/simple mastectomy without reconstruction  Obesity    Each Risk Factor Represents 2 Points  This patient has:  Age 81-74 years    Each Risk Factor Represents 3  Points  This patient has:  None    Each Risk Factor Represents 5 points  This patient has:  None    Total:  4      Based on this screen, the patient is at Moderate Risk.  We need to weigh the risks and benefits of DVT/PE prophylaxis vs bleeding.    Would recommend:  Total Risk= 0  (<0.1%)       Very low= Early ambulation    Total Risk = 1-2 (0.1%)     Low=  Sequential compression device (SCD)    Total Risk = 3-4 (0.6%) Low/ Moderate= Sequential compression device (SCD)     Total Risk = 5-6 (1.27%) Moderate =Sequential compression device (SCD)    Total Risk = 7-8 (2.5%) High= SCD and Lovenox* (40 mg SQ for 10 days)    Total Risk=>8 (11.3%) High= SCD and Lovenox* ( 40 mg SQ for 10 days)

## 2020-04-05 NOTE — H&P (View-Only) (Signed)
[facility] [place] Office  [phone]    Consultation Report     Treatment Team  Ref Prov: Lin, Robert Tsu-Ju, MD Primary Care Physician :   Lin, Robert Tsu-Ju, MD Radiology facility: [facility] ([facility])--[phone] Breast Surgeon : Kalianne Fetting MD  [phone]   Plastic Surgeon none Radiation Onc:  Stella Hetelekidis MD:  [phone] ([place]); Medical Onc:  Kathleen Harnden, MD [phone] ([place])      Breast Cancer Comprehensive Care Plan Summary  Date of diagnosis: 01/25/2020 Event : DCIS ECOG Performance: Grade 0  (Fully active) Genetic Testing :genetics consultation requested   Presentation : Abnormal mammogram with microcalcifications Side: left Focality :  unifocal Location: lateral region   Biologic Tumor characteristics  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  Ki-67:  DCIS - not indicated Oncotype Dx:  pending discussion with oncology   Estrogen Receptor: positive >90 % Progesterone Receptor: positive >90 % Her-2-neu Receptor: DCIS, not indicated    Staging  Tumor Size:   DCIS Nodes: clinically/US negative Systemic Metastases: clinically negative Stage: Stage 0 , Tis N0 M0   Treatment  Modality Date    Surgery   Planned Left and partial mastectomy with ultrasound guided needle localization   Radiation     Endocrine     Chemotherapy Not indicated Not indicated   Biologic Not indicated Not indicated   Clinical trial     Chief complaint : New Diagnosis of DCIS    History of Present Illness    [name] is a 63 y.o. 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     She underwent the following workup.  Imaging  Study Date Location Results   Diagnostic Mammo/  Ultrasound 01/11/2020 [facility]  There are faint punctate calcifications in the upper outer quadrant measuring up to approximately 1.2 cm in AP dimension with possible branching. Additional scattered benign-appearing calcifications noted.   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 She also denies any history of unexplained bleeding diathesis. All other systems were reviewed and are negative.      Past Medical History  The patient   has a past medical history of Coronary artery disease, non-occlusive (2010), Fibroid, uterine (05/2012), H/O echocardiogram (06/28/13), H/O mammogram (03/15/2102 ), Hypertensive disorder (2004), Left ventricular hypertrophy (2010), Malignant neoplasm (01/25/2020), Ovarian cyst, right (05/2012), Pap smear for cervical cancer screening (07/25/2101), and Post-operative nausea and vomiting.    Past Surgical History  The patient  has a past surgical history that includes Cardiac catheterization (2010); Tubal ligation (1984); Inguinal hernia repair (2004); Myomectomy (04/2014); LAPAROSCOPIC, HYSTERECTOMY, TOTAL, BSO (Bilateral, 05/07/2018); CYSTOSCOPY (Bilateral, 05/07/2018); LAPAROSCOPIC, LYSIS, ADHESIONS (05/07/2018); Abdominal surgery (1999); Excision Biopsy with Needle Localization (01/25/2020); Oophorectomy (05/08/2019); and Hysterectomy (05/08/2019).    Medications  The patient's   Current Outpatient Medications:   •  Calcium-Magnesium 500-250 MG Tab, Take 1 tablet by mouth daily., Disp: , Rfl:   •  carvedilol (COREG) 25 MG tablet, Take 2 tablets (50 mg   total) by mouth 2 (two) times daily with meals.  (Patient taking differently: Take 25 mg by mouth 2 (two) times daily with meals  ), Disp: 120 tablet, Rfl: 2  •  Cholecalciferol (VITAMIN D) 2000 UNITS Cap, Take 2,000 Unit by mouth daily., Disp: , Rfl:   •  docusate sodium (COLACE) 100 MG capsule, Take 1 capsule (100 mg total) by mouth 2 (two) times daily, Disp: 30 capsule, Rfl: 0  •  irbesartan-hydroCHLOROthiazide (AVALIDE) 300-12.5 MG per tablet, Take 1 tablet by mouth nightly, Disp: , Rfl:   •  spironolactone (ALDACTONE) 50 MG tablet, Take 1 tablet (50 mg total) by mouth daily. (Patient taking differently: Take 25 mg by mouth nightly  ), Disp: 30 tablet, Rfl: 6    Allergies / sensitivities and intolerance   Aspirin, Pravastatin, Levaquin [levofloxacin hemihydrate], Other, Sudafed [pseudoephedrine], and Tetanus immune globulin    Social History  The patient  reports that she has never smoked. She has never used smokeless tobacco. She reports current alcohol use of about 1.0 standard drinks of alcohol per week. She reports that she does not use drugs.    Family History  The patient's family history includes Bone cancer in her mother; Breast cancer in her sister; Breast cancer (age of onset: 80) in her mother; Cancer in her mother; Heart failure in her brother and sister; Ovarian cancer in her maternal grandmother.    Gyn History    Gynecological History   Age of menarche:: 12   Breast pain:: No     Age of menopause:: 57   Birth Control pills:: No     Birth Control pills:: No   G:: 3     P:: 2   Hormone Replacement therapy:: No     Age at first delivery:: 23      Bra size:: 42DD      Last mammogram:: 01/25/20   Marital status:: Married     Nipple discharge:: No            Physical Exam   BP 161/88 (BP Site: Left arm, Patient Position: Sitting)    Pulse 70    Temp 97.3 °F (36.3 °C) (Temporal)    Resp 18    Ht 1.626 m (5' 4")    Wt 101.7 kg (224 lb 3.2 oz)    BMI 38.48 kg/m²     WDWN female in NAD  HEENT: Eyes- Clear, no scleral icterus,      Ears/Nose- WNL  Hearing-WNL      Mouth-WNL  Neck:  No thyromegaly or masses, trachea midline  Chest: Respiratory effort normal   Palpation of the chest wall: No tenderness, asymmetry, or crepitus   Lungs-Clear to ascultation   CV:  RRR without murmurs or gallops, no pedal edema  BJM:  Gait and station normal  Ext:  No CCE,   Upper Extremity Range of Motion: WNL   Skin:  Free of significant ulcers or lesions  Neuro:  Grossly intact, no focal findings, alert and oriented, asks appropriate questions  LN:  No axillary, supraclavicular or cervical adenopathy  Breast:    Symmetry:Symmetric   Ptosis grade  3     Right breast:     Presence:Present    Skin:Skin color, texture, turgor normal. No rashes or lesions.     Nipple:normal    Tissue: Within normal limits     Left breast:     Presence:Present    Skin:Skin color, texture, turgor normal. No rashes or lesions.       Nipple:normal    Tissue: Within normal limits  Assessment    This is a 63 y.o.  Post-menopausal female patient who has a new diagnosis of ductal carcinoma in situ of the left breast.   Based on clinical and imaging assessment, the anatomic extent is Tis N0 M0, Stage 0.      I have explained that Ductal Carcinoma In situ is considered a pre-invasive cancer. It is a clonal proliferation of malignant-looking cells in the lining of a breast duct without evidence of spread outside the duct. DCIS is considered the earliest form of breast cancer. If untreated, DCIS may progress to invasive breast cancer. DCIS is highly curable with expected survival rates of 97 to 99 %.    I have also explained the possibility of upstaging to invasive ductal carcinoma on final pathologic analysis of the entire lesion after complete surgical resection. The risk of upstaging varies from 10 - 50 % depending on the characteristics of the tumor, type of initial  biopsy, aggressiveness of cells and extent of the lesion.  If invasive carcinoma is detected on final pathology, we will reassess the prognosis  and treatment recommendations.     ------ MRI in local staging ------  The patient's mammogram demonstrates dense breasts that may limit the ability to assess the true extent of breast cancer. I have discussed the role of breast MRI in order to define the loco-regional extent of her breast cancer, determine the presence of multicentric disease and evaluate the contralateral breast.  I have explained the higher incidence of false positive findings on MRI and need for MRI biopsy in case of detection of any abnormality on MRI. It is unknown whether small cancers detected on MRI have clinical significance and impact the prognosis or outcomes. We have recommended against MRI considering the details of  her case.    ------ Genetics -------    We have explained that mutations are changes in the genetic code of a gene that affect its function. Inherited gene mutations can be passed on from a parent to a child. Some inherited gene mutations increase breast cancer risk. BRCA1 and BRCA2 are the best-known genes linked to breast cancer. Only 5 - 10 % of patients with breast cancer have a familial mutation as the cause of the cancer.    I have recommended genetic consultation for measurement of genetic risk and BRCA testing due to her family history. I have explained that mutation in the BRCA1 or BRCA2 genes can lead up to 60 - 80 % risk of breast cancer and up to 20 - 44 % risk of ovarian cancer by age 70.     ------Surgical Candidacy------    The patient is a surgical candidate, but with additional risks.  These include  cardiac disease    Comorbidities include:cardiac disease      The risks and complications associated with surgery in general were discussed, including but not limited to bleeding, infection, reaction to anesthesia, deep venous thrombosis, pneumonia, pulmonary embolus and death.     Treatment     I have explained the prognostic characteristics of this tumor based on its biologic characteristics and stage.  I  have introduced the concept of a multidisciplinary approach to her treatment, including surgical and possibly medical radiation treatment. Because DCIS is not invasive, treatments are largely directed to local management.     ------Surgery ------    Amongst the options for surgery, we have discussed breast conservation surgery versus mastectomy and mastectomy   with reconstruction.  Breast conservation surgery is used in combination with radiation treatment.  I have explained that in appropriately selected patients these options provide similar outcomes in terms of local-regional control and survival of breast cancer. We take into account the size of the tumor relative to the size of the breast in order to provide the best margins and the best cosmetic outcome.     Ms. Warga is an excellent candidate for breast conservation. She is interested in this approach.        ----- Technical aspects of surgery and reconstruction options ------  In breast conservation surgery the goal is to remove the tumor with a surrounding rim of normal tissue, usually a minimum 1-2 mm distance from the tumor to the margin of resection. If the original surgery provides a positive or a narrow margin then consideration is given to a second surgery in order to obtain negative margins. Rarely, a mastectomy may be required (in a subsequent surgery) if the initial surgical procedures do not achieve negative margins. Mild pain, ecchymosis and a gentle scar are common after lumpectomy., I anticipate a moderate volume of breast tissue will be removed with the lumpectomy and it is likely to  cause distortion of the breast size and shape. I will sculpt and realign the remaining breast tissue to restore a natural appearance to the breast shape.    1. Localization of the clip-  I will do intraoperative localization with ultrasound.  The risks include bleeding, infection and need for further surgery.     2. Pain control- We have discussed pain control  options including the importance of a surgical bra.  The majority of patients do well with NSAIDS and tylenol based on ERAS concepts.  Will prescribe roxicodone as needed.  I have answered all her questions.     3. Oncoplastic tissue rearrangement- I anticipate a moderate volume of breast tissue will be removed with the lumpectomy and it is likely to  cause distortion of the breast size and shape. I will sculpt and realign the remaining breast tissue to restore a natural appearance to the breast shape.     4. Risks- Mild pain, ecchymosis and an incision related scare are common after lumpectomy.  Additional potential complications include but not exclusively anesthetic complications, infection, scar tissue, clip retention, fluid retention,  bleeding requiring surgical evacuation, DVT/PE, pneumonia, and recurrence.    ----- Lymph node management ------    We reviewed  the significance of loco-regional disease and staging.  Tumor spread to the axillary lymph nodes was discussed; an anatomic drawing was used to clarify this.     We recommended against axillary staging because her tumor is non-invasive and the patient is being treated with lumpectomy.     ------ Medical Oncology ------    In the context of her multidisciplinary care, the patient  may benefit from adjuvant medical treatment to decrease the risk of recurrence (systemic and local). Decisions about medical treatments are based on the biologic characteristics and extent of the cancer.     This DCIS  expresses receptors for estrogen. I have recommended consultation with a medical oncologist for consideration of adjuvant endocrine treatment with either Tamoxifen or an Aromatase inhibitor. This may be done once the full analysis from pathology is available.     ----- Radiotherapy ------    Radiation therapy uses high-energy rays or particles that destroy cancer cells. In DCIS, radiotherapy may decrease the risk of recurrence by approximately half with the    addition of adjuvant radiotherapy to the breast conservation surgery.  Selected cases of ductal carcinoma in situ may not experience a significant benefit from radiotherapy. The role of radiotherapy will be better defined once the full analysis from pathology is available in consultation with radiation oncology.  .    --------Medical Comorbidities----------------  History of cardiac disease    PLAN    Summary of Treatment Plan  Schedule for a left partial segmental mastectomy with ultrasound needle localization  Consultation with medical oncology to discuss the risks and benefits of  adjuvant endocrine therapy  Consultation with radiation oncology to discuss the risks and benefits of radiation therapy  Genetic counseling  I will present her case at our multidisclipinary tumor board    -----Resources------------  *The patient has been given the contact information for "Life with Cancer".  *The patient has been provided with a list of web based information sources including the NCCN guidelines (www.nccn.org) for the treatment of breast cancer.  *The patient also met with the [facility] breast navigator, for additional psychosocial support    PRESURGICAL CHECKLIST    Allergies:   Allergies   Allergen Reactions   • Aspirin Shortness Of Breath and Rash   • Pravastatin Other (See Comments)     Myalgias   • Levaquin [Levofloxacin Hemihydrate]      Joint pains   • Other Swelling     Antihistamines   • Sudafed [Pseudoephedrine] Swelling   • Tetanus Immune Globulin      Skin reaction       Antibiotics: Ancef 2 gram IV on call       Medical clearance:None    DVT Screen:  Patients undergoing breast cancer surgery are at increased risk of developing DVT's and pulmonary embolisms.      Each Risk Factor Represents 1 Point.    This patient has:  Lumpectomy/excisional biopsy/simple mastectomy without reconstruction  Obesity    Each Risk Factor Represents 2 Points  This patient has:  Age 61-74 years    Each Risk Factor Represents 3  Points  This patient has:  None    Each Risk Factor Represents 5 points  This patient has:  None    Total:  4      Based on this screen, the patient is at Moderate Risk.  We need to weigh the risks and benefits of DVT/PE prophylaxis vs bleeding.    Would recommend:  Total Risk= 0  (<0.1%)       Very low= Early ambulation    Total Risk = 1-2 (0.1%)     Low=  Sequential compression device (SCD)    Total Risk = 3-4 (0.6%) Low/ Moderate= Sequential compression device (SCD)     Total Risk = 5-6 (1.27%) Moderate =Sequential compression device (SCD)    Total Risk = 7-8 (2.5%) High= SCD and Lovenox* (40 mg SQ for 10 days)    Total Risk=>8 (11.3%) High= SCD and Lovenox* ( 40 mg SQ for 10 days)

## 2020-04-06 ENCOUNTER — Encounter (HOSPITAL_BASED_OUTPATIENT_CLINIC_OR_DEPARTMENT_OTHER): Payer: Self-pay | Admitting: Surgery

## 2020-04-06 ENCOUNTER — Ambulatory Visit (INDEPENDENT_AMBULATORY_CARE_PROVIDER_SITE_OTHER): Payer: Commercial Managed Care - POS | Admitting: Surgery

## 2020-04-06 ENCOUNTER — Encounter (HOSPITAL_BASED_OUTPATIENT_CLINIC_OR_DEPARTMENT_OTHER): Payer: Self-pay

## 2020-04-06 VITALS — BP 161/88 | HR 70 | Temp 97.3°F | Resp 18 | Ht 64.0 in | Wt 224.2 lb

## 2020-04-06 DIAGNOSIS — D0512 Intraductal carcinoma in situ of left breast: Secondary | ICD-10-CM

## 2020-04-06 NOTE — Progress Notes (Signed)
Met with patient and husband post consultation with Dr Cyndi Bender. Pathologyrevealsleft breast with Ca++ is DCIS.    DCIS, ER>90%, PR>90%      Breast MRI(not indicated mostly fatty replacement)  Genetics pending  Dr Mammie Russian sx  Dr Hetelekidis post sx        Patientwill proceed withLeft lumpectomy with [place] NL on 04/14/20, post op is 04/27/20.Discussed community resources to include services with [facility] and mentorship program with [employer]. Provided reputable websites for further information, to include NCCN and the [place] Breast Cancer Treatment Handbook. Noted we will present her case at tumor board. Encouraged her to contact me directly if she has any further questions.

## 2020-04-07 ENCOUNTER — Other Ambulatory Visit (HOSPITAL_BASED_OUTPATIENT_CLINIC_OR_DEPARTMENT_OTHER): Payer: Self-pay

## 2020-04-07 DIAGNOSIS — D0512 Intraductal carcinoma in situ of left breast: Secondary | ICD-10-CM

## 2020-04-10 ENCOUNTER — Ambulatory Visit: Payer: Commercial Managed Care - POS | Attending: Surgery

## 2020-04-10 ENCOUNTER — Encounter: Payer: Self-pay | Admitting: Surgery

## 2020-04-10 ENCOUNTER — Telehealth (INDEPENDENT_AMBULATORY_CARE_PROVIDER_SITE_OTHER): Payer: Self-pay | Admitting: MS"

## 2020-04-10 NOTE — Telephone Encounter (Signed)
Crista Nuon was referred to genetic counseling by Dr. Cyndi Bender on 04/07/2020 for a breast cancer diagnosis.    I left a voicemail with Miquel Lamson asking them to call us back so that we can begin the scheduling process.     I also sent them an email informing them about the referral.    Minus Liberty  Clinical Assistant  Ph: [phone]   Fax: [phone]

## 2020-04-10 NOTE — Pre-Procedure Instructions (Signed)
• Surgical Risk Level : (Low, Intermediate, High)  o Low    • Surgeon Testing Requirements:  o na    • Anesthesia Guideline Requirements:  o na    • Specialist Notes / Test Results / Records Requested:  o na    • Recent Hospitalization / ED Visit:   o na    • Future Plan / Upcoming Appts:   o na    • Labs/Testing @ [facility]:   o na    • Email Sent To Patient:   o Provided PSS email [email or web address] or phone [phone] to patient/family member       • NPO Instructions given to patient:    o NPO instructions reviewed: Clear liquids up to 2 hours prior to arrival time, then NPO. No solid food 8 hours prior to scheduled procedure time. Examples of clear liquids include water, apple juice, sports drinks such as Gatorade, coffee or tea without milk or cream. Sugar or sweetener may be added  o Fasting Requirements per Preoperative Fasting Guidelines for Elective Surgeries and Procedures Requiring Anesthesia Policy Revised 11/2019.  Ingested material Fasting requirement   Clear liquids/Ice Chips 2 hours prior to arrival time   Breast milk 4 hours prior to scheduled procedure time   Infant formula 6 hours prior to scheduled procedure time   Non-human milk 8 hours prior to scheduled procedure time   Solid food 8 hours prior to scheduled procedure time     o Bowel prep instructions (if applicable): na    • Faxes Sent To:   o Pharmacy- DOS medication orders (if applicable)-na      • Epic Orders Entered:   o Preop Nursing Anesthesia Orders      • Other Outlying information gathered that does not fit anywhere else  o Covid assessment done, visitor policy reviewed.    • Chart Room Handoff for Further  Follow-up if Applicable:  o na    Visitor Restriction Guidelines per [facility] Policy as of 01/31/20:  All visitors must adhere to the following:   • Exhibit no COVID-19 symptoms   • Age 16+ (internally exceptions can be made by administrative team as needed, e.g., siblings, end of life situations, etc.)   • In keeping with  the CDC’s current guidance, regardless of vaccination status, everyone in a healthcare facility must wear a mask covering their mouth and nose the entire time they are in the facility. Visitors who fail to wear a mask properly will be asked to leave.   • The following face coverings cannot be worn at any [place] location: gaiter style masks, bandanas or vented masks.   • No visitors are allowed for patients with suspected or confirmed COVID-19, except in end-of-life situations.  Hospital Inpatient:   Visitation hours: 9 a.m. - 6:30 p.m. daily   • Adult patients may have two visitors, in addition to a Designated Support Person (DSP), if applicable   • Pediatric patients may have two parents/guardians at bedside 24/7. For pediatric outpatient areas, only parents/guardians may visit  Outpatient/Ambulatory Surgery:   • Adult patients may have one visitor, in addition to their Designated Support Person (DSP) on the day of surgery.   • No family members will be allowed into Phase 1 recovery areas. Physicians will call contact person to give report of procedure.   • Family / visitor will be called by PACU staff  to review discharge instructions via phone and answer any questions.       Advise to call surgeon if need to cancel surgery arises.      Patient verbalized understanding and acceptance of above information.

## 2020-04-13 NOTE — Pre-Procedure Instructions (Signed)
Day Before Surgery Confirmation Call    Spoke to:[name] [name]    Left Message:    Confirmed surgery date, arrival time, and location.   Arrival: 1445  Surgery: 1645    NPO instructions reviewed: Clear liquids up to 2 hours prior to arrival time, then NPO. No solid food 8 hours prior to scheduled procedure time. Examples of clear liquids include water, apple juice, sports drinks such as Gatorade, coffee or tea without milk or cream. Sugar or sweetener may be added    Fasting Requirements per Preoperative Fasting Guidelines for Elective Surgeries and Procedures Requiring Anesthesia Policy Revised 11/2019  Ingested material Fasting requirement   Clear liquids/Ice Chips 2 hours prior to arrival time   Breast milk 4 hours prior to scheduled procedure time   Infant formula 6 hours prior to scheduled procedure time   Non-human milk 8 hours prior to scheduled procedure time   Solid food 8 hours prior to scheduled procedure time       Ambulatory Screening Tool:   Patient denies themselves or anyone in immediate family currently experiencing fever or any symptoms of acute respiratory illness (cough, or shortness of breath).  Patient denies travel outside of the [place] in the last month.   Patient denies being in close contact with a confirmed COVID19 patient.   Patient does not reside in a nursing home or long-term care facility.   Patient denies recent visit to ED, hospitalization or PCP visit for acute illness since PSS RN interview.     Patient instructed that if you or your family does develop new symptoms, to contact surgeons office immediately, prior to arrival at the hospital.     Visitor Restriction Guidelines per [facility] as of 01/31/20:    All visitors must adhere to the following:    Exhibit no COVID-19 symptoms    Age 63+ (internally exceptions can be made by administrative team as needed, e.g., siblings, end of life situations, etc.)    In keeping with the CDCs current guidance, regardless of vaccination status,  everyone in a healthcare facility must wear a mask covering their mouth and nose the entire time they are in the facility. Visitors who fail to wear a mask properly will be asked to leave.    The following face coverings cannot be worn at any [facility] location: gaiter style masks, bandanas or vented masks.    No visitors are allowed for patients with suspected or confirmed COVID-19, except in end-of-life situations.    Hospital Inpatient:   Visitation hours: 9 a.m. - 6:30 p.m. daily    Adult patients may have two visitors, in addition to a [employer] Person (DSP), if applicable    Pediatric patients may have two parents/guardians at bedside 24/7. For pediatric outpatient areas, only parents/guardians may visit  Outpatient/Ambulatory Surgery:    Adult patients may have one visitor, in addition to their Designated Support Person (DSP) on the day of surgery.    No family members will be allowed into Phase 1 recovery areas. Physicians will call contact person to give report of procedure.    Family / visitor will be called by PACU staff  to review discharge instructions via phone and answer any questions.       Advise to call surgeon if need to cancel surgery arises.     Patient verbalized understanding and acceptance of above information.  If a voicemail is left for the patient and any of the COVID19 ambulatory screening questions are positive, patient  is asked to call PSS ASAP.

## 2020-04-14 ENCOUNTER — Ambulatory Visit: Payer: Self-pay

## 2020-04-14 ENCOUNTER — Ambulatory Visit: Payer: Commercial Managed Care - POS

## 2020-04-14 ENCOUNTER — Ambulatory Visit
Admission: RE | Admit: 2020-04-14 | Discharge: 2020-04-14 | Disposition: A | Discharge status: Home / Self Care | Payer: Commercial Managed Care - POS | Source: Ambulatory Visit | Attending: Surgery | Admitting: Surgery

## 2020-04-14 ENCOUNTER — Encounter
Admission: RE | Disposition: A | Discharge status: Home / Self Care | Payer: Self-pay | Source: Ambulatory Visit | Attending: Surgery

## 2020-04-14 ENCOUNTER — Ambulatory Visit: Payer: Commercial Managed Care - POS | Admitting: Pain Medicine

## 2020-04-14 ENCOUNTER — Encounter: Payer: Self-pay | Admitting: Surgery

## 2020-04-14 DIAGNOSIS — Z803 Family history of malignant neoplasm of breast: Secondary | ICD-10-CM | POA: Insufficient documentation

## 2020-04-14 DIAGNOSIS — Z170 Estrogen receptor positive status [ER+]: Secondary | ICD-10-CM | POA: Insufficient documentation

## 2020-04-14 DIAGNOSIS — Z7984 Long term (current) use of oral hypoglycemic drugs: Secondary | ICD-10-CM | POA: Insufficient documentation

## 2020-04-14 DIAGNOSIS — I2510 Atherosclerotic heart disease of native coronary artery without angina pectoris: Secondary | ICD-10-CM | POA: Insufficient documentation

## 2020-04-14 DIAGNOSIS — E119 Type 2 diabetes mellitus without complications: Secondary | ICD-10-CM | POA: Insufficient documentation

## 2020-04-14 DIAGNOSIS — Z886 Allergy status to analgesic agent status: Secondary | ICD-10-CM | POA: Insufficient documentation

## 2020-04-14 DIAGNOSIS — I119 Hypertensive heart disease without heart failure: Secondary | ICD-10-CM | POA: Insufficient documentation

## 2020-04-14 DIAGNOSIS — D0512 Intraductal carcinoma in situ of left breast: Secondary | ICD-10-CM | POA: Insufficient documentation

## 2020-04-14 HISTORY — DX: Type 2 diabetes mellitus without complications: E11.9

## 2020-04-14 HISTORY — PX: BIOPSY, BREAST, TUMOR EXCISION, ULTRASOUND NEEDLE LOCALIZATION: SHX3236

## 2020-04-14 HISTORY — DX: Other specified health status: Z78.9

## 2020-04-14 LAB — GLUCOSE WHOLE BLOOD - POCT
Whole Blood Glucose POCT: 84 mg/dL (ref 70–100)
Whole Blood Glucose POCT: 68 mg/dL — ABNORMAL LOW (ref 70–100)
Whole Blood Glucose POCT: 76 mg/dL (ref 70–100)

## 2020-04-14 SURGERY — BIOPSY, BREAST, WITH NEEDLE LOCALIZATION, WITH ULTRASOUND (US) GUIDANCE
Anesthesia: Anesthesia General | Site: Breast | Laterality: Left | Wound class: Clean

## 2020-04-14 MED ORDER — PROPOFOL INFUSION 10 MG/ML
INTRAVENOUS | Status: DC | PRN
Start: 2020-04-14 — End: 2020-04-14
  Administered 2020-04-14: 50 mg via INTRAVENOUS
  Administered 2020-04-14: 120 ug/kg/min via INTRAVENOUS

## 2020-04-14 MED ORDER — HYDROMORPHONE HCL 0.5 MG/0.5 ML IJ SOLN
0.5000 mg | INTRAMUSCULAR | Status: DC | PRN
Start: 2020-04-14 — End: 2020-04-14

## 2020-04-14 MED ORDER — MIDAZOLAM HCL 1 MG/ML IJ SOLN (WRAP)
INTRAMUSCULAR | Status: AC
Start: 2020-04-14 — End: ?
  Filled 2020-04-14: qty 2

## 2020-04-14 MED ORDER — PROPOFOL 10 MG/ML IV EMUL (WRAP)
INTRAVENOUS | Status: AC
Start: 2020-04-14 — End: ?
  Filled 2020-04-14: qty 60

## 2020-04-14 MED ORDER — LACTATED RINGERS IV SOLN
INTRAVENOUS | Status: DC
Start: 2020-04-14 — End: 2020-04-14

## 2020-04-14 MED ORDER — FENTANYL CITRATE (PF) 50 MCG/ML IJ SOLN (WRAP)
25.0000 ug | INTRAMUSCULAR | Status: DC | PRN
Start: 2020-04-14 — End: 2020-04-14
  Administered 2020-04-14: 25 ug via INTRAVENOUS

## 2020-04-14 MED ORDER — BUPIVACAINE HCL (PF) 0.5 % IJ SOLN
INTRAMUSCULAR | Status: DC | PRN
Start: 2020-04-14 — End: 2020-04-14
  Administered 2020-04-14: 10 mL

## 2020-04-14 MED ORDER — DEXAMETHASONE SODIUM PHOSPHATE 4 MG/ML IJ SOLN (WRAP)
INTRAMUSCULAR | Status: DC | PRN
Start: 2020-04-14 — End: 2020-04-14
  Administered 2020-04-14: 4 mg via INTRAVENOUS

## 2020-04-14 MED ORDER — AMMONIA AROMATIC IN INHA
1.0000 | Freq: Once | RESPIRATORY_TRACT | Status: DC | PRN
Start: 2020-04-14 — End: 2020-04-14

## 2020-04-14 MED ORDER — ONDANSETRON HCL 4 MG/2ML IJ SOLN
INTRAMUSCULAR | Status: AC
Start: 2020-04-14 — End: ?
  Filled 2020-04-14: qty 2

## 2020-04-14 MED ORDER — MEPERIDINE HCL 25 MG/ML IJ SOLN
12.5000 mg | INTRAMUSCULAR | Status: DC | PRN
Start: 2020-04-14 — End: 2020-04-14

## 2020-04-14 MED ORDER — FENTANYL CITRATE (PF) 50 MCG/ML IJ SOLN (WRAP)
INTRAMUSCULAR | Status: AC
Start: 2020-04-14 — End: ?
  Filled 2020-04-14: qty 2

## 2020-04-14 MED ORDER — DEXTROSE 5 % IV SOLN
2.0000 g | INTRAVENOUS | Status: AC
Start: 2020-04-14 — End: 2020-04-14
  Administered 2020-04-14: 18:00:00 1 g via INTRAVENOUS
  Administered 2020-04-14: 18:00:00 2 g via INTRAVENOUS
  Filled 2020-04-14: qty 2000

## 2020-04-14 MED ORDER — HYDROCODONE-ACETAMINOPHEN 5-325 MG PO TABS
1.0000 | ORAL_TABLET | Freq: Once | ORAL | Status: DC | PRN
Start: 2020-04-14 — End: 2020-04-14

## 2020-04-14 MED ORDER — BUPIVACAINE HCL (PF) 0.5 % IJ SOLN
INTRAMUSCULAR | Status: AC
Start: 2020-04-14 — End: ?
  Filled 2020-04-14: qty 30

## 2020-04-14 MED ORDER — CEFAZOLIN SODIUM 1 G IJ SOLR
INTRAMUSCULAR | Status: AC
Start: 2020-04-14 — End: ?
  Filled 2020-04-14: qty 2000

## 2020-04-14 MED ORDER — DEXAMETHASONE SODIUM PHOSPHATE 4 MG/ML IJ SOLN
INTRAMUSCULAR | Status: AC
Start: 2020-04-14 — End: ?
  Filled 2020-04-14: qty 1

## 2020-04-14 MED ORDER — SODIUM CHLORIDE 0.9% BAG (IRRIGATION USE)
INTRAVENOUS | Status: DC | PRN
Start: 2020-04-14 — End: 2020-04-14
  Administered 2020-04-14: 1000 mL

## 2020-04-14 MED ORDER — SCOPOLAMINE 1 MG/3DAYS TD PT72
MEDICATED_PATCH | TRANSDERMAL | Status: DC | PRN
Start: 2020-04-14 — End: 2020-04-14
  Administered 2020-04-14: 1 via TRANSDERMAL

## 2020-04-14 MED ORDER — ONDANSETRON HCL 4 MG/2ML IJ SOLN
4.0000 mg | Freq: Once | INTRAMUSCULAR | Status: DC | PRN
Start: 2020-04-14 — End: 2020-04-14

## 2020-04-14 MED ORDER — LIDOCAINE HCL 2 % IJ SOLN
INTRAMUSCULAR | Status: DC | PRN
Start: 2020-04-14 — End: 2020-04-14
  Administered 2020-04-14: 40 mg

## 2020-04-14 MED ORDER — FENTANYL CITRATE (PF) 50 MCG/ML IJ SOLN (WRAP)
INTRAMUSCULAR | Status: DC | PRN
Start: 2020-04-14 — End: 2020-04-14
  Administered 2020-04-14: 100 ug via INTRAVENOUS

## 2020-04-14 MED ORDER — METOCLOPRAMIDE HCL 5 MG/ML IJ SOLN
10.0000 mg | Freq: Once | INTRAMUSCULAR | Status: DC | PRN
Start: 2020-04-14 — End: 2020-04-14

## 2020-04-14 MED ORDER — LIDOCAINE HCL (PF) 2 % IJ SOLN
INTRAMUSCULAR | Status: AC
Start: 2020-04-14 — End: ?
  Filled 2020-04-14: qty 5

## 2020-04-14 MED ORDER — LIDOCAINE-EPINEPHRINE 1 %-1:100000 IJ SOLN
INTRAMUSCULAR | Status: DC | PRN
Start: 2020-04-14 — End: 2020-04-14
  Administered 2020-04-14: 10 mL

## 2020-04-14 MED ORDER — FENTANYL CITRATE (PF) 50 MCG/ML IJ SOLN (WRAP)
INTRAMUSCULAR | Status: AC
Start: 2020-04-14 — End: 2020-04-14
  Administered 2020-04-14: 19:00:00 25 ug via INTRAVENOUS
  Filled 2020-04-14: qty 2

## 2020-04-14 MED ORDER — ONDANSETRON HCL 4 MG/2ML IJ SOLN
INTRAMUSCULAR | Status: DC | PRN
Start: 2020-04-14 — End: 2020-04-14
  Administered 2020-04-14: 4 mg via INTRAVENOUS

## 2020-04-14 MED ORDER — SCOPOLAMINE 1 MG/3DAYS TD PT72
MEDICATED_PATCH | TRANSDERMAL | Status: AC
Start: 2020-04-14 — End: ?
  Filled 2020-04-14: qty 1

## 2020-04-14 MED ORDER — LIDOCAINE-EPINEPHRINE 1 %-1:100000 IJ SOLN
INTRAMUSCULAR | Status: AC
Start: 2020-04-14 — End: ?
  Filled 2020-04-14: qty 20

## 2020-04-14 MED ORDER — MIDAZOLAM HCL 1 MG/ML IJ SOLN (WRAP)
INTRAMUSCULAR | Status: DC | PRN
Start: 2020-04-14 — End: 2020-04-14
  Administered 2020-04-14: 2 mg via INTRAVENOUS

## 2020-04-14 SURGICAL SUPPLY — 123 items
ADHESIVE SKIN CLOSURE DERMABOND ADVANCED (Skin Closure) ×1 IMPLANT
ADHESIVE SKIN CLOSURE DERMABOND ADVANCED .7 ML LIQUID APPLICATOR (Skin Closure) ×1 IMPLANT
ADHESIVE SKIN DERMABOND ADV (Skin Closure) ×1
ADHESIVE SKNCLS 2 OCTYL CYNCRLT .7ML (Skin Closure) ×2
APPLIER IN CLP PRM SGCLP II SUP INTLK (Procedure Accessories)
APPLIER INTERNAL CLIP L9.75 IN AUTOMATIC (Procedure Accessories) IMPLANT
APPLIER INTERNAL CLIP L9.75 IN AUTOMATIC PREMIUM SURGICLIP II SUPER (Procedure Accessories) IMPLANT
BRA 34-36 IN MEDIUM POST SURGICAL WHITE STYLE L LIGHT COMPRESSION (Patient Supply) IMPLANT
BRA LARGE ADJ SHLDR STRAP FRONT CLOSURE POST LYCRA VELCRO COTTON WHITE (Patient Supply) IMPLANT
BRA POST SRG LYCRA VLCR CTTN LG ADJ (Patient Supply) IMPLANT
BRA POST SRG LYCRA VLCR CTTN XL ADJ (Patient Supply)
BRA SURGICAL VELCRO LRG 38-40I (Patient Supply)
BRA SURGICAL VELCRO MED 34-36I (Patient Supply) IMPLANT
BRA SURGICAL VELCRO XLG 40-42I (Patient Supply)
BRA XL POST SURGICAL WHITE ADJUSTABLE (Patient Supply) IMPLANT
BRA XL POST SURGICAL WHITE ADJUSTABLE SHOULDER STRAP FRONT CLOSURE (Patient Supply) IMPLANT
CLOSURE STERI-STRIP 1X5IN (Dressing) ×1
COVER FLEXIBLE LIGHT HANDLE PLASTIC GREEN (Procedure Accessories) ×1 IMPLANT
COVER FLEXIBLE MEDLINE LIGHT HANDLE (Procedure Accessories) ×1 IMPLANT
COVER LGHT HNDL PLS LF STRL FLXB DISP (Procedure Accessories) ×2
CVR LGHTHNDL FLEX SFT 1PK (Procedure Accessories) ×1
DRAPE 96X5IN ISOSILK SMALL T TIP BAND TAPE STRIP GEL PROBE LATEX FREE (Procedure Accessories) ×1 IMPLANT
DRAPE PRB ISOSILK 96X5IN LF STRL SM T (Procedure Accessories) ×2 IMPLANT
DRESSING TEGADERM 2X2 3/4 (Dressing) ×2 IMPLANT
DRESSING TEGADERM 4X4X3/4IN (Dressing) ×1
DRESSING TRANSPARENT L4 3/4 IN X W4 IN (Dressing) ×1 IMPLANT
DRESSING TRANSPARENT L4 3/4 IN X W4 IN POLYURETHANE ADHESIVE (Dressing) ×1 IMPLANT
DRESSING TRNS PU STD TGDRM 4.75X4IN LF (Dressing) ×2
ELECTRODE ADULT PATIENT RETURN L9 FT REM POLYHESIVE ACRYLIC FOAM (Procedure Accessories) ×1 IMPLANT
ELECTRODE PATIENT RETURN L9 FT VALLEYLAB (Procedure Accessories) ×1 IMPLANT
ELECTRODE PT RTN RM PHSV ACRL FM C30- LB (Procedure Accessories) ×2
GAUZE SPONGE 4X4 NS (Dressing) ×1
GEL ULTRASOUND AQUASONIC 60GM (Procedure Accessories) ×1
GEL ULTRASOUND AQUASONIC CLEAR DOPPLER TRANSMISSION BACTERIOSTATIC (Procedure Accessories) ×1 IMPLANT
GEL US AQSN C DPLR TRNMS BCTRST FRHYD FR (Procedure Accessories) ×2 IMPLANT
GLOVE SRG 7 BGL SRG LTX STRL PF BEAD CUF (Glove) ×2
GLOVE SURG BIOGEL PF LTX SZ7.0 (Glove) ×1
GLOVE SURGICAL 7 BIOGEL SURGEONS POWDER (Glove) ×1 IMPLANT
GLOVE SURGICAL 7 BIOGEL SURGEONS POWDER FREE BEAD CUFF TEXTURE SURFACE (Glove) ×1 IMPLANT
GOWN PREVNTN+ XLARGE STRL (Gown) ×1
GOWN SRG XL ORBS LF STRL AAMI LVL 4 (Gown) ×2
GOWN SURGICAL XL BLUE AAMI LEVEL 4 BREATHABLE CSR WRAP PROTECTION (Gown) ×1 IMPLANT
GOWN SURGICAL XL MEDLINE BLUE AAMI LEVEL (Gown) ×1 IMPLANT
HANDLE LGHT LF STRL ADP LGHT CNTRL + TCH (Other) ×2
HANDLE LIGHT ADAPTIVE LIGHT CONTROL PLUS (Other) ×1 IMPLANT
HANDLE LIGHT ADAPTIVE LIGHT CONTROL PLUS TECHNOLOGY SNAP ON LENS TOUCH (Other) ×1 IMPLANT
HANDLE TRUMPF LIGHT  DISP (Other) ×1
KIT INFECTION CONTROL CUSTOM (Kits) ×3 IMPLANT
KIT INFECTION CONTROL CUSTOM IFOH03 (Kits) ×1 IMPLANT
MANIFOLD NEPTUNE II 4PORT SUCT (Filter) ×1
MANIFOLD SCT 2 STD NPTN 2 LF NS 4 PORT (Filter) ×2
MANIFOLD SUCTION 2 STANDARD 4 PORT (Filter) ×1 IMPLANT
MANIFOLD SUCTION 2 STANDARD 4 PORT NEPTUNE 2 WASTE MANAGEMENT SYSTEM (Filter) ×1 IMPLANT
MRKR SKN W RULER AND LABELS (Positioning Supplies) ×1
NEEDLE  LOC BREAST 20GX5CM (Needles) ×1 IMPLANT
NEEDLE HPO PP RW BD 22GA 1.5IN LF STRL (Needles) ×4 IMPLANT
NEEDLE HYPODERMIC L1 1/2 IN OD22 GA REGULAR WALL BD POLYPROPYLENE (Needles) ×2 IMPLANT
NEEDLE LOC BREAST 20GX5CM (Needles) ×3
NEEDLE LOCALIZATION L5 CM OD20 GA KOPANS EASY THREAD WIRE STIFFEN (Needles) ×1 IMPLANT
NEEDLE YALE SHRTBV 22GX1.5 DSP (Needles) ×2
PAD ELECTROSRG GRND REM W CRD (Procedure Accessories) ×1
PEN SRGMRK 6IN LF STRL RLR LG RSRV REG (Positioning Supplies) ×2
PEN SURGICAL MARKING MEDLINE SKIN RULER (Positioning Supplies) ×1 IMPLANT
PEN SURGICAL MARKING SKIN RULER LARGE RESERVOIR REGULAR TIP LABEL (Positioning Supplies) ×1 IMPLANT
PENCIL SMKEVC LF COAT PSHBTN (Cautery) ×2
PENCIL SMOKE EVAC 70MM PSH BTN (Cautery) ×1
PENCIL SMOKE EVACUATOR COATED PUSH (Cautery) ×1 IMPLANT
PENCIL SMOKE EVACUATOR COATED PUSH BUTTON NEPTUNE E-SEP (Cautery) ×1 IMPLANT
PROBE COVER T SHAPE LATEX FREE (Procedure Accessories) ×1
SCRUB POVIDONE IODINE SOL 4OZ (Scrub Supplies) ×1
SLN PREP POVIDONE IODINE 4OZ (Scrub Supplies) ×1
SOLN IRRISEPT WOUND DEBRIDEMNT (Solution) ×1
SOLUTION PREP ANTISEPTIC SCREW TOP (Scrub Supplies) ×2 IMPLANT
SOLUTION PREP ANTISEPTIC SCREW TOP BOTTLE LIQUID 7.5% PVP IODINE 4 OZ (Scrub Supplies) ×1 IMPLANT
SOLUTION PREP ANTISEPTIC SCRW BTTL LQD MDLN 10% PVP IODNE 4OZ DRK BRWN (Scrub Supplies) ×1 IMPLANT
SOLUTION PRP 10% PVP IOD 4OZ LF ANSEP (Scrub Supplies) ×2
SOLUTION PRP 7.5% PVP IOD 4OZ LF ANSEP (Scrub Supplies) ×2
SPONGE GAUZE L4 IN X W4 IN 12 PLY (Sponge) IMPLANT
SPONGE GAUZE L4 IN X W4 IN 12 PLY CRINKLE WEAVE FLUFF WOVEN KERLIX (Sponge) IMPLANT
SPONGE GAUZE L4 IN X W4 IN 16 PLY (Dressing) ×1 IMPLANT
SPONGE GAUZE L4 IN X W4 IN 16 PLY MAXIMUM ABSORBENT USP TYPE VII (Dressing) IMPLANT
SPONGE GAUZE STR 10'S 12PLY4X4 (Sponge)
SPONGE GZE CTTN CRTY 4X4IN LF NS 16 PLY (Dressing) ×2
SPONGE GZE KRLX 4X4IN LF NS 12 PLY CRNKL (Sponge)
SPONGE GZE PLS CTTN CRTY 4X4IN LF STRL (Sponge)
SPONGE GZE STR 2'S 12PLY 4X4 (Sponge)
STRIP SKIN CLOSURE L5 IN X W1 IN (Dressing) ×1 IMPLANT
STRIP SKIN CLOSURE L5 IN X W1 IN REINFORCE STERI-STRIP POLYESTER WHITE (Dressing) ×1 IMPLANT
STRIP SKNCLS PLSTR STRSTRP 5X1IN LF STRL (Dressing) ×2
SURGICLIP PREMIUM II M-9.75 (Procedure Accessories)
SUT VICRYL 3-0 SH 27IN (Suture) ×1
SUTURE ABS 3-0 SH PDS2 27IN MFL UD (Suture)
SUTURE ABS 3-0 SH VCL 27IN BRD COAT UD (Suture) ×2
SUTURE ABS 4-0 PS2 MNCRL MTPS 27IN MFL (Suture) ×2
SUTURE COATED VICRYL 3-0 SH L27 IN BRAID (Suture) ×1 IMPLANT
SUTURE MONOCRYL 4-0 PS-2 L27 IN (Suture) ×1 IMPLANT
SUTURE MONOCRYL 4-0 PS-2 L27 IN MONOFILAMENT UNDYED ABSORBABLE (Suture) ×1 IMPLANT
SUTURE MONOCRYL 4-0 PS2 27IN (Suture) ×1
SUTURE NABSB SLK 2-0 FS PRMHND 18IN BRD (Suture) ×4
SUTURE PDS II 3-0 SH CLEAR MON (Suture)
SUTURE PDS II 3-0 SH L27 IN MONOFILAMENT (Suture) IMPLANT
SUTURE PDS II 3-0 SH L27 IN MONOFILAMENT UNDYED ABSORBABLE (Suture) IMPLANT
SUTURE SILK 2-0 FS 18IN (Suture) ×2
SUTURE SILK PERMA HAND BLACK 2-0 FS L18 (Suture) ×2 IMPLANT
SUTURE SILK PERMA HAND BLACK 2-0 FS L18 IN BRAID NONABSORBABLE (Suture) ×1 IMPLANT
SYRINGE 10 ML GRADUATE NONPYROGENIC DEHP (Syringes, Needles) ×1 IMPLANT
SYRINGE 10 ML GRADUATE NONPYROGENIC DEHP FREE PVC FREE LOK MEDICAL (Syringes, Needles) ×1 IMPLANT
SYRINGE LUER LOCK 10CC (Syringes, Needles) ×1
SYRINGE MED 10ML LL LF STRL GRAD N-PYRG (Syringes, Needles) ×2
SYSTEM WND IRR 0.05% CHG IRRISEPT LF (Solution) ×2
SYSTEM WOUND IRRIGATION DEBRIDEMENT (Solution) ×1 IMPLANT
SYSTEM WOUND IRRIGATION DEBRIDEMENT CLEANSING IRRISEPT 0.05% (Solution) ×1 IMPLANT
TRAY DRY SKIN SCRUB (Prep) ×1
TRAY MAJOR PROC FOAKS (Pack) ×1
TRAY SKIN SCRUB L8 IN 6 WING 6 SPONGE STICK 2 TIP APPLICATOR DRY VINYL (Prep) ×1 IMPLANT
TRAY SKIN SCRUB MEDLINE L8 IN VINYL (Prep) ×1 IMPLANT
TRAY SKN SCRB VNYL CTTN 8IN LF STRL 6 (Prep) ×2
TRAY SRG MAJOR PROC IFOH (Pack) ×2 IMPLANT
TRAY SURGICAL MAJOR (Pack) ×1 IMPLANT
TUBING CONNECTING STERILE 10FT (Tubing) ×1
TUBING SCT PVC ARG 3/16IN 10FT LF STRL (Tubing) ×2
TUBING SUCTION ID3/16 IN L10 FT (Tubing) ×1 IMPLANT
TUBING SUCTION ID3/16 IN L10 FT NONCONDUCTIVE STRAIGHT MALE FEMALE (Tubing) ×1 IMPLANT

## 2020-04-14 NOTE — Interval H&P Note (Signed)
PRESURGICAL CHECKLIST    I have personally reviewed the patient's H and P and all relevant imaging. I have re- examined the patient and marked the appropriate site.  There are no changes to the patient's physical condition or planned procedure.    Exceptions:None  Cor:RRR  Lungs:Breathing non labored    I have discussed the planned procedure along with the risks and benefits with the patient.  All questions were answered.  The written consent was reviewed with the patient and obtained. We will proceed.    Henderson Baltimore, MD, FACS  [facility]    Allergies:   Allergies   Allergen Reactions    Aspirin Shortness Of Breath and Rash    Pravastatin Other (See Comments)     Myalgias    Levaquin [Levofloxacin Hemihydrate]      Joint pains    Other Swelling     Antihistamines    Sudafed [Pseudoephedrine] Swelling    Tetanus Immune Globulin      Skin reaction       Antibiotics: Ancef 2 gram IV on call     Medical clearance:None    Smoking Screen:No history    Diabetic Screen: No history    Pain Management: Counseling provided    Sleep Apnea: No history    DVT Screen:  Patients undergoing breast cancer surgery are at increased risk of developing DVT's and pulmonary embolisms.      Each Risk Factor Represents 1 Point.    This patient has:  Lumpectomy/excisional biopsy/simple mastectomy without reconstruction    Each Risk Factor Represents 2 Points  This patient has:  Malignancy (present or previous)    Each Risk Factor Represents 3 Points  This patient has:  None    Each Risk Factor Represents 5 points  This patient has:  None    Total:  3      Based on this screen, the patient is at Low Risk.  We need to weigh the risks and benefits of DVT/PE prophylaxis vs bleeding.    Would recommend:  Total Risk= 0  (<0.1%)       Very low= Early ambulation    Total Risk = 1-2 (0.1%)     Low=  Sequential compression device (SCD)    Total Risk = 3-4 (0.6%) Low/ Moderate= Sequential compression device (SCD)     Total Risk = 5-6  (1.27%) Moderate =Sequential compression device (SCD)    Total Risk = 7-8 (2.5%) High= SCD and Lovenox* (40 mg SQ for 10 days)    Total Risk=>8 (11.3%) High= SCD and Lovenox* ( 40 mg SQ for 10 days)

## 2020-04-14 NOTE — Progress Notes (Signed)
Dr Reed Pandy notified BS recheck 68 after ginger ale/crackers, pt has no sx/s of hypoglycemia. Pt drinking apple juice. Pt may be dcd per MD.

## 2020-04-14 NOTE — Op Note (Signed)
Dictated

## 2020-04-14 NOTE — Transfer of Care (Signed)
Anesthesia Transfer of Care Note    Patient: [name] [name]    Procedures performed: Procedure(s):  BIOPSY, BREAST, TUMOR EXCISION, ULTRASOUND NEEDLE LOCALIZATION    Anesthesia type: General TIVA    Patient location:Phase II PACU    Last vitals:   Vitals:    04/14/20 1520   BP: 150/77   Pulse: 73   Resp: 16   Temp: (!) 35.8 C (96.4 F)   SpO2: 99%       Post pain: Patient not complaining of pain, continue current therapy      Mental Status:awake    Respiratory Function: tolerating room air    Cardiovascular: stable    Nausea/Vomiting: patient not complaining of nausea or vomiting    Hydration Status: adequate    Post assessment: no apparent anesthetic complications    Signed by: Janan Halter  04/14/20 6:35 PM

## 2020-04-14 NOTE — Progress Notes (Signed)
BS 76, pt has no signs/symptoms of hypoglycemia, pt drinking ginger ale and eating crackers. Will reassess bs.

## 2020-04-14 NOTE — Discharge Instr - AVS First Page (Addendum)
[facility])  Henderson Baltimore, MD, [facility]   Discharge Instructions After Lumpectomy/Excisional Biopsy       Admit Date: 04/14/2020   Discharge Date: 04/14/2020     Attending: Boykin Peek, MD   Surgeon: Surgeon(s):  Edmiston, Rochele Raring, MD      Procedures:  Procedure(s) (LRB):  BIOPSY, BREAST, TUMOR EXCISION, ULTRASOUND NEEDLE LOCALIZATION (Left)      Wound Care:    You will have one or more incisions on your breast  .  All of your stitches are buried under the skin and human super glue or steri strips are used to seal the skin. You may have some bruising after surgery.  This is normal.  ? You may have either an ace-bandage around your chest or a supportive bra placed at the time of surgery. These may be removed after 48 hrs. Use a supportive (sports bra) afterwards for comfort as needed.  ? You can shower 48 hrs after your surgery and get the wounds wet. You may use soap as well.  ? There may be a small amount of drainage from your wounds, this is normal and nothing to worry about.  Place a dry gauze or bandage (available at any drug store) on the wound to absorb any drainage.    For pain:    People experience different types and amount of pain or discomfort after surgery. The goal of pain management is to assess your own level of discomfort and to use local measures or medication as it is needed. You will have better results controlling your pain before your pain is severe.  ? The less your breast moves the less it will hurt. You will have either an ace- bandage around your chest or a supportive bra placed at the time of surgery.   ? An ice pack applied to the surgery areas may be helpful to decrease discomfort and swelling. A small pillow positioned in the armpit may also decrease discomfort.  ? You may take Tylenol 650 mg every 4 -6 hours as needed for pain and/or  ? Ibuprofen (Advil or Motrin) 600 mg (200mg  x 3) for 2 or 3 days as needed. We recommend three tablets with breakfast,  lunch, and dinner (three times a day). It is important to take the Ibuprofen at the time of your meals to avoid irritation of you stomach.  ? We prefer to avoid the use of Narcotics (like Vicodin or Percocet) as these often cause a feeling of light-headedness, confusion, unusual thoughts, problems with urination; or nausea, upper stomach pain and constipation.  We will prescribe Roxicodone if you need stronger pain medication.    When to call - 24hrs-7 days/week - Office [phone]    ? If you have severe, uncontrolled pain at the biopsy site.  ? If you run at fever of 101.5?F or higher within a few days of the biopsy.  ? If you have a large amount of drainage that is soaking the bandage more than once a day.  ? If the area around your wound becomes red/inflamed or very swollen.    Please follow up in our office in one week for review of results and follow up exam.  Post Anesthesia Discharge Instructions    Although you may be awake and alert in the recovery room, small amounts of anesthetic remain in your system for about 24 hours.  You may feel tired and sleepy during this time.      You are  advised to go directly home from the hospital.    Plan to stay at home and rest for the remainder of the day.    It is advisable to have someone with you at home for 24 hours after surgery.    Do not operate a motor vehicle, or any mechanical or electrical equipment for the next 24 hours.      Be careful when you are walking around, you may become dizzy.  The effects of anesthesia and/or medications are still present and drowsiness may occur    Do not consume alcohol, tranquilizers, sleeping medications, or any other non prescribed medication for the remainder of the day.    Diet:  begin with liquids, progress your diet as tolerated or as directed by your surgeon.  Nausea and vomiting may occur in the next 24 hours.    You may remove the patch behind your ear in 48 hrs, please wash hands after the medicine is absorbed  into skin.

## 2020-04-14 NOTE — Anesthesia Preprocedure Evaluation (Addendum)
Anesthesia Evaluation    AIRWAY    Mallampati: II    TM distance: >3 FB  Neck ROM: full  Mouth Opening:full   CARDIOVASCULAR    cardiovascular exam normal       DENTAL    no notable dental hx     PULMONARY    pulmonary exam normal     OTHER FINDINGS                Relevant Problems   CARDIO   (+) Coronary artery disease   (+) Hypertension       PSS Anesthesia Comments: HTN, CAD, DM  PONV        Anesthesia Plan    ASA 2     general                     intravenous induction   Detailed anesthesia plan: general IV      Post Op:  Trial extubation is not planned.  Post op pain management: per surgeon    informed consent obtained    Plan discussed with CRNA.                   Signed by: Tye Maryland 04/14/20 3:26 PM

## 2020-04-14 NOTE — Brief Op Note (Signed)
BRIEF OP NOTE    Date Time: 04/14/20 6:25 PM    Patient Name:   [name] [name]    Date of Operation:   04/14/2020    Providers Performing:   Surgeon(s):  Ranard Harte, Rochele Raring, MD    Assistant (s):   Circulator: Tomi Bamberger, RN  Scrub Person: Jenell Milliner  First Assistant: Keturah Barre; Sander Nephew  Second Circulator: Scherry Ran, RN    Operative Procedure:   Left Lumpectomy with Ultrasound Guided Needle Localization at the  2 o'clock posiiton  Left Ultrasound Guided Needle Placement      Preoperative Diagnosis:   Pre-Op Diagnosis Codes:     * Ductal carcinoma in situ (DCIS) of left breast [D05.12]    Postoperative Diagnosis:   Post-Op Diagnosis Codes:     * Ductal carcinoma in situ (DCIS) of left breast [D05.12]    Anesthesia:   Monitor Anesthesia Care    Estimated Blood Loss:    * No values recorded between 04/14/2020  5:37 PM and 04/14/2020  6:25 PM *    Implants:   * No implants in log *    Drains:   Drains: no    Specimens:     ID Type Source Tests Collected by Time Destination   A : Left Breast 1 o'clock lesion- short superior, long lateral Lumpectomy Breast SURGICAL PATHOLOGY Boykin Peek, MD 04/14/2020 1806    B : Left breast deep margin Lumpectomy Breast SURGICAL PATHOLOGY Boykin Peek, MD 04/14/2020 1810    C : Left  Breast Superior Margin Lumpectomy Breast SURGICAL PATHOLOGY Boykin Peek, MD 04/14/2020 1812          Findings:   Open coil clip in specimen    Complications:   Non3      Signed by: Boykin Peek, MD                                                                           [facility]

## 2020-04-15 NOTE — Op Note (Signed)
Procedure Date: 04/14/2020     Patient Type: A     SURGEON: Boykin Peek MD  ASSISTANT:       ASSISTANT:  Bo Mcclintock.     PREOPERATIVE DIAGNOSIS:  Left breast ductal carcinoma in situ.     POSTOPERATIVE DIAGNOSIS:  Left breast ductal carcinoma in situ.     TITLE OF PROCEDURE:  Left lumpectomy with ultrasound-guided needle localization and left  ultrasound-guided needle placement.     ANESTHESIA:  IV sedation with local.     ESTIMATED BLOOD LOSS:  Minimal.     FLUIDS:  Crystalloids.     COMPLICATIONS:  None.     INDICATIONS FOR PROCEDURE:  This is a 64 year old who presented with DCIS in the left breast.  We are  doing this procedure to establish local control.     DESCRIPTION OF PROCEDURE:  The patient was seen in the preoperative holding area, where the planned  procedure was again discussed, along with the risks, benefits, options and  alternatives.  The films have been reviewed extensively, along with the  technical plan.  The patient was brought back to the operating room, and  placed on the table in the usual supine position.  After the appropriate  monitoring devices were applied, the patient underwent IV sedation.  DVT  prophylaxis was assured, IV antibiotics were administered and a surgical  pause was performed.  Using focus-targeted ultrasound, the location of the  open coil HydroMARK clip in the upper outer quadrant was identified.  The  patient's left breast, chest and axilla were prepped and draped in the  usual sterile manner.  Again, using focus-targeted ultrasound, the location  of the open coil HydroMARK clip was localized with two orthogonal 5 cm  Kopans wires.  Photodocumentation was obtained.  A local field block was  established using 1% lidocaine and 0.5% Marcaine.  A curvilinear incision  was made, and carried down through the skin and subcutaneous tissue, right  at the areolar margin.  The tissue surrounding both wires was dissected out  using Metzenbaum scissors and  electrocautery.  Hemostasis was achieved  using electrocautery.  The specimen was oriented.  The specimen mammogram  confirmed the presence of the open coil HydroMARK clip, as well as both  wires, as well as additional microcalcifications.  Additional superior and  deep tissue was excised.  The wound was then irrigated.  Hemostasis was  checked for and achieved using electrocautery.  The wound was then closed  in two layers with interrupted 3-0 Vicryl and subcuticular 4-0 PDS.  The  wound was dressed in the usual sterile manner.  The patient tolerated the  procedure well.  The patient was awoken and transferred to the recovery  room.           D:  04/14/2020 18:30 PM by Dr. Rochele Raring. Trinetta Alemu, MD [phone])  T:  04/15/2020 01:55 AM by NTS      (Conf: [id number]) (Doc ID: [id number])

## 2020-04-17 ENCOUNTER — Encounter: Payer: Self-pay | Admitting: Surgery

## 2020-04-17 ENCOUNTER — Telehealth: Payer: Self-pay | Admitting: Surgery

## 2020-04-17 NOTE — Telephone Encounter (Signed)
Patient stated that she had surgery last Friday & would like to know when her bandage should be removed. She is scheduled for post-op next week but discharge paperwork says she is supposed to be seen within a week.    Ph.: [phone] (M)

## 2020-04-17 NOTE — Anesthesia Postprocedure Evaluation (Signed)
Anesthesia Post Evaluation    Patient: [name] [name]    Procedure(s):  BIOPSY, BREAST, TUMOR EXCISION, ULTRASOUND NEEDLE LOCALIZATION    Anesthesia type: general    Last Vitals:   Vitals Value Taken Time   BP 144/65 04/14/20 1930   Temp 36.1 C (97 F) 04/14/20 1836   Pulse 67 04/14/20 1930   Resp 16 04/14/20 1930   SpO2 99 % 04/14/20 1930                 Anesthesia Post Evaluation:     Patient Evaluated: bedside  Patient Participation: complete - patient participated  Level of Consciousness: awake and alert    Pain Management: adequate    Airway Patency: patent    Anesthetic complications: No      PONV Status: none    Cardiovascular status: stable  Respiratory status: acceptable  Hydration status: acceptable        Signed by: Algernon Huxley, 04/17/2020 7:49 AM

## 2020-04-17 NOTE — Telephone Encounter (Signed)
Spoke with the pt, advised  she can pull off  The  dressing 48-72 after the surgery don't touch the stiches, and can getting shower. Informed her appointment is on 10/14 @10 :30. The pt verbalized understanding.

## 2020-04-18 ENCOUNTER — Encounter (HOSPITAL_BASED_OUTPATIENT_CLINIC_OR_DEPARTMENT_OTHER): Payer: Self-pay

## 2020-04-18 LAB — LAB USE ONLY - HISTORICAL SURGICAL PATHOLOGY

## 2020-04-19 ENCOUNTER — Telehealth (HOSPITAL_BASED_OUTPATIENT_CLINIC_OR_DEPARTMENT_OTHER): Payer: Self-pay

## 2020-04-19 NOTE — Telephone Encounter (Signed)
Emailed med onc scheduler to reach out to patient for appt, sx 04/14/20, she is DCIS.

## 2020-04-25 NOTE — Progress Notes (Signed)
Treatment Team  Ref Prov: Terrilee Files, MD Primary Care Physician :   Terrilee Files, MD Radiology facility: [facility])--[phone] Breast Surgeon : Henderson Baltimore MD  [phone]   Plastic Surgeon none Radiation Onc:  Miguel Dibble MD:  [phone] [facility] [place]); Medical Onc:  Laney Pastor, MD [phone] [facility] [place])      Breast Cancer Comprehensive Care Plan Summary  Date of diagnosis: 01/25/2020 Event : DCIS ECOG Performance: Grade 0  (Fully active) Genetic Testing :genetics consultation requested   Presentation : Abnormal mammogram with microcalcifications Side: left Focality :  unifocal Location: lateral region   Biologic Tumor characteristics  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  Ki-67:  DCIS - not indicated Oncotype Dx:  pending discussion with oncology   Estrogen Receptor: positive >90 % Progesterone Receptor: positive >90 % Her-2-neu Receptor: DCIS, not indicated    Staging  Tumor Size:   DCIS Nodes: clinically/US negative Systemic Metastases: clinically negative Stage: Stage 0 , Tis N0 M0   Treatment  Modality Date    Surgery   04/14/20 Left and partial mastectomy with ultrasound guided needle localization   Radiation     Endocrine     Chemotherapy Not indicated Not indicated   Biologic Not indicated Not indicated   Clinical trial           [facility]  CANCER POST OPERATIVE VISIT NOTE    Subjective:  [name] had left lumpectomy on 04/14/20 . She reports discomfort at the surgical site. Denies severe pain, breast swelling and  redness at the surgical site.   She is otherwise active . She is tolerating her diet well. Pathology results as below.    Physical Exam:  Clinically, she is doing well   Vital signs are within normal limits  The incisions are healing well with no evidence of infection.    Assessment and Plan:  Clinically, she is doing well.  We have reviewed the results of the pathology  with the patient.  I have given the patient a copy of the pathology report.  Pathology reviewed by Dr. Cyndi Bender  At this point, I would recommend that we set her up for a follow up left mammogram in 6 months.  She is also encouraged to follow up with medical and radiation oncology.  She ought to follow up in the office in 6 months.    PATHOLOGY  SURGICAL PATHOLOGY REPORT    DIAGNOSIS:       A. LEFT BREAST 1 O'CLOCK LESION, NEEDLE LOCALIZATION EXCISION:    DUCTAL CARCINOMA IN SITU, SEE SYNOPTIC REPORT       B. LEFT BREAST DEEP MARGIN:    BENIGN UNREMARKABLE MAMMARY TISSUE, NEGATIVE FOR    DUCTAL CARCINOMA IN SITU       C. LEFT BREAST SUPERIOR MARGIN:    BENIGN UNREMARKABLE MAMMARY TISSUE, NEGATIVE FOR    DUCTAL CARCINOMA IN SITU       SYNOPTIC REPORT:       1. INVASIVE OR MICROINVASIVE CARCINOMA: ABSENT       2. DUCTAL CARCINOMA IN SITU:       PROCEDURE AND LATERALITY: LEFT NEEDLE LOCALIZATION EXCISION    (LUMPECTOMY)        SPECIMEN SIZE: 5.5 CM ANTERIOR TO POSTERIOR, 4.0 CM MEDIAL TO     LATERAL, 2.0 CM SUPERIOR TO INFERIOR AND 23 GRAMS        SPECIMEN INTEGRITY: INTACT LUMPECTOMY SPECIMEN INCLUDING  TWO     ADDITIONAL SEPARATE SURGICAL MARGINS        DCIS TYPE: SOLID        NUCLEAR GRADE: GRADE 2        DCIS SIZE/EXTENT: SCATTERED FOCI OF DCIS PRESENT IN 4 OF 9 GLASS     SLIDES        SURGICAL MARGINS: UNINVOLVED, CLOSEST ANTERIOR SURGICAL MARGIN AT     GREATER THAN 3 MM CLEARANCE, REMAINING MARGINS CLEAR BY MORE THAN     5 MM        NECROSIS: PRESENT, FOCAL MINIMAL FINDING        MICROCALCIFICATIONS: NOT IDENTIFIED       ESTROGEN AND PROGESTERONE RECEPTOR STATUS: PREVIOUSLY    PERFORMED ELSEWHERE ([facility] PATH ACCESSION NUMBER [id number])    WITH THE FOLLOWING FINDINGS:     - ESTROGEN RECEPTOR: POSITIVE, >90%, STRONG INTENSITY     - PROGESTERONE RECEPTOR: POSITVE, >90%, STRONG  INTENSITY        SENTINEL LYMPH NODE: NONE SUBMITTED        AJCC TNM CANCER STAGE: pTis pNX       3. LOBULAR CARCINOMA IN SITU: ABSENT       4. ADDITIONAL PATHOLOGIC FINDINGS: RECENT BIOPSY SITE CAVITY AND FOCAL         ATYPICAL DUCTAL HYPERPLASIA       JH/da    04/18/20          COMMENT:    While no single block likely contains sufficient DCIS for ancillary    testing, block A2 contains the most DCIS.

## 2020-04-27 ENCOUNTER — Encounter (HOSPITAL_BASED_OUTPATIENT_CLINIC_OR_DEPARTMENT_OTHER): Payer: Self-pay | Admitting: Family Nurse Practitioner

## 2020-04-27 ENCOUNTER — Ambulatory Visit (INDEPENDENT_AMBULATORY_CARE_PROVIDER_SITE_OTHER): Payer: Commercial Managed Care - POS | Admitting: Family Nurse Practitioner

## 2020-04-27 VITALS — BP 143/84 | HR 70 | Temp 97.3°F | Resp 18 | Ht 64.0 in | Wt 223.8 lb

## 2020-04-27 DIAGNOSIS — D0512 Intraductal carcinoma in situ of left breast: Secondary | ICD-10-CM

## 2020-05-04 ENCOUNTER — Ambulatory Visit: Payer: Commercial Managed Care - POS | Attending: Hematology & Oncology | Admitting: Hematology & Oncology

## 2020-05-04 ENCOUNTER — Encounter: Payer: Self-pay | Admitting: Hematology & Oncology

## 2020-05-04 ENCOUNTER — Telehealth (INDEPENDENT_AMBULATORY_CARE_PROVIDER_SITE_OTHER): Payer: Self-pay | Admitting: MS"

## 2020-05-04 VITALS — BP 144/83 | HR 65 | Temp 98.4°F | Resp 20 | Wt 223.8 lb

## 2020-05-04 DIAGNOSIS — D0512 Intraductal carcinoma in situ of left breast: Secondary | ICD-10-CM

## 2020-05-04 NOTE — Progress Notes (Signed)
MEDICAL ONCOLOGY INITIAL CONSULTATION    Patient Name: [name] [name], [name] [name]  Patient DOB:   10-19-1955    Date of Service: 05/04/2020  Provider Name : Donnamarie Rossetti, MD         Breast Medical Oncology, [facility]    Physician Requesting Consult: Boykin Peek, MD    Reason for Consult/Chief Complaint: Left breast DCIS    I have reviewed her medical record, pathology, labs and imaging and summarized them here.    ONCOLOGY HISTORY:  I. 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 Biopsy 01/25/20 with DCIS, ER/PR>90%  - S/p LPM 04/14/2020: DCIS; margins clear; solid grade 2; necrosis minimally present    History of Present Illness:  [name] [name] "[name] [name]" is a 64 y.o. female who was seen today in consultation at the request of Dr. Cyndi Bender for evaluation of the following: newly diagnosed LEFT breast DCIS.    She had her screening mammogram at the end of May 2021 that flagged some calcifications in the left breast. This was confirmed in the OUQ of the left breast on diagnostic imaging. Biopsy was done showing grade 2 DCIS with necrosis. She met with Dr. Cyndi Bender and underwent a partial mastectomy on 04/14/20 that revealed more DCIS but widely clear margins.    - She is healing well from the surgery  - She will be meeting with Dr. Scheryl Marten from rad/onc in a few weeks to discuss this.  - She has no history of arthritis or bone density issues.  - She does have a strong cardiac history with CAD, HTN, DM, obesity. No hx of MI. Does follow with cardiology.    She is here today with her husband, Lyman Bishop.    Doctors  Breast Surgeon: Henderson Baltimore, MD  Rad Onc: Kerby Less, MD  PCP: Terrilee Files, MD    ROS: Please see hpi. All others reviewed and are negative.    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    stage 0    Fibroid, uterine 05/2012    H/O echocardiogram 06/28/13    EF 65%, mild LVH    Has immunity to COVID-19 virus     Pfizer x2 doses last dose 10/23/19    Hypertensive disorder 2004    controlled-145/80    Left ventricular hypertrophy 2010    s/p echo    Obesity (BMI 35.0-39.9 without comorbidity)     Ovarian cyst, right 05/2012    Pap smear for cervical cancer screening 07/25/2101    no hx of abnormal pap smear    Post-operative nausea and vomiting     needs "patch"    Type 2 diabetes mellitus, controlled     Last A1C 6.3       Past Surgical History:   Procedure Laterality Date    ABDOMINAL SURGERY  1999    BIOPSY, BREAST, TUMOR EXCISION, ULTRASOUND NEEDLE LOCALIZATION Left 04/14/2020    Procedure: BIOPSY, BREAST, TUMOR EXCISION, ULTRASOUND NEEDLE LOCALIZATION;  Surgeon: Boykin Peek, MD;  Location: [facility];  Service: General;  Laterality: Left;    CARDIAC CATHETERIZATION  2010    non-obs CAD    CYSTOSCOPY Bilateral 05/07/2018    Procedure: CYSTOSCOPY;  Surgeon: Maple Hudson,  Everardo All, MD;  Location: [facility];  Service: Gynecology Oncology;  Laterality: Bilateral;    EXCISION BIOPSY WITH NEEDLE LOCALIZATION  01/25/2020    INGUINAL HERNIA REPAIR  2004    right    LAPAROSCOPIC, HYSTERECTOMY, TOTAL, BSO Bilateral 05/07/2018    Procedure: LAPAROSCOPIC, HYSTERECTOMY, TOTAL, BSO, LYSIS OF ADHESIONS;  Surgeon: Zackery Barefoot, MD;  Location: [facility];  Service: Gynecology Oncology;  Laterality: Bilateral;    LAPAROSCOPIC, LYSIS, ADHESIONS  05/07/2018    Procedure: LAPAROSCOPIC, LYSIS, ADHESIONS;  Surgeon: Zackery Barefoot, MD;  Location:  [facility];  Service: Gynecology Oncology;;    MYOMECTOMY  04/2014    TUBAL LIGATION  1984       OB History     Gravida   2    Para   2    Term   2    Preterm        AB        Living           SAB        IAB        Ectopic        Multiple        Live Births               Obstetric  Comments   Menarche 71. Menopause 57. G3P2. No HRT.             Family History   Problem Relation Age of Onset    Breast cancer Mother 58        bilateral breast cancer, d. metastatic breast    Multiple myeloma Mother 51    Breast cancer Sister 37        DCIS    Heart failure Brother     Ovarian cancer Paternal Grandmother 57    Heart failure Sister     Bladder Cancer Brother 39    Leukemia Nephew 7        Brother's son.    Bone cancer Maternal Grandmother 9       Social History     Tobacco Use    Smoking status: Never Smoker    Smokeless tobacco: Never Used   Vaping Use    Vaping Use: Never used   Substance Use Topics    Alcohol use: Yes     Alcohol/week: 1.0 standard drink     Types: 1 Glasses of wine per week     Comment: only on occasion    Drug use: No     Social History     Social History Narrative    Not on file       Allergies   Allergen Reactions    Aspirin Shortness Of Breath and Rash    Pravastatin Other (See Comments)     Myalgias    Levaquin [Levofloxacin Hemihydrate]      Joint pains    Other Swelling     Antihistamines    Sudafed [Pseudoephedrine] Swelling    Tetanus Immune Globulin      Skin reaction       Current Outpatient Medications on File Prior to Visit   Medication Sig Dispense Refill    ascorbic acid (VITAMIN C) 1000 MG tablet Take 1,000 mg by mouth      carvedilol (COREG) 25 MG tablet Take 2 tablets (50 mg total) by mouth 2 (two) times daily with meals. (Patient taking differently: Take 25 mg by mouth 2 (two) times daily  with meals   ) 120 tablet 2    Cholecalciferol (VITAMIN D) 2000 UNITS Cap Take 2,000 Unit by mouth daily.      docusate sodium (COLACE) 100 MG capsule Take 1 capsule (100 mg total) by mouth 2 (two) times daily 30 capsule 0    irbesartan-hydroCHLOROthiazide (AVALIDE) 300-12.5 MG per tablet Take 1 tablet by mouth nightly      Januvia 50 MG tablet 50 mg daily         melatonin 3 mg tablet 1 tablet at bedtime as needed      Multiple  Vitamins-Minerals (multivitamin with minerals) tablet Take 1 tablet by mouth daily      spironolactone (ALDACTONE) 50 MG tablet Take 1 tablet (50 mg total) by mouth daily. (Patient taking differently: Take 25 mg by mouth nightly   ) 30 tablet 6     No current facility-administered medications on file prior to visit.       Physical Exam:  Vitals:    05/04/20 1110   BP: 144/83   BP Site: Right arm   Patient Position: Sitting   Cuff Size: Large   Pulse: 65   Resp: 20   Temp: 98.4 F (36.9 C)   TempSrc: Oral   SpO2: 100%   Weight: 101.5 kg (223 lb 12.8 oz)     Body mass index is 38.42 kg/m.  Body surface area is 2.14 meters squared.  ECOG 0       CONSTITUTIONAL: This is a well developed, well-nourished female in NAD, appears stated age.   HENT: NC/AT, MMM, no oral lesions.   EYES: EOMI, sclera anicteric.   NECK: Supple, ROM normal, non-tender.  CARDIAC: Regular rate and rhythm. Normal S1, S2. No murmurs, rubs or gallops.   PULMONARY: Lungs are CTAB without crackles/wheezes.  BREAST: Right breast is benign, left breast with well healing peri-areolar scar, no concern for infection  LYMPH: No cervical, occipital, supraclavicular LAD.  ABDOMINAL: soft, NT, ND. No HSM. NABS. No guarding, rebound.   EXTREMITIES: Full ROM in all extremities. No c/c/e.  SKIN: Skin is warm and dry, not diaphoretic. No ulcerations or erosions.   NEUROLOGIC EXAM: A&OX3. CN II-XII intact. No focal deficits. Gait and posture wnl.    PSYCHIATRIC: Appropriate mood and affect    Labs:  Reviewed in epic today.    Studies/Pathology:  Reviewed in epic today.  12/12/19: B/l screening mammo; Left calcifications. BIRADS 0     01/11/20: Left dx mammo: Left UOQ calcifications. Biopsy recommended.     01/25/20: Left breast biopsy: DCIS intermediate nuclear grade, with necrosis and calcifications; ER>90%, PR>90%    04/14/20: LPM: DCIS; margins clear; solid grade 2; necrosis minimally present    Assessment/Plan:    Zanylah Hardie is a 64 y.o. female with  newly diagnosed left breast DCIS here for medical oncology consultation.     I explained that DCIS is a stage 0 breast cancer that lacks invasion. Therefore by definition the cancer cells cannot spread to the lymph nodes, other parts of the breast or other parts of the body. It is standard of care to fully excise DCIS as these cancer cells can progress to true invasive carcinoma over time. Radiation may be considered after lumpectomy surgery in patients to reduce the risk of another breast event (another DCIS or invasive breast cancer) in the ipsilateral breast. Benefit of radiation is determined by the radiation oncologist and may involve the use of a genomic test. There is never a role  for chemotherapy in DCIS.     Chemoprevention for 5 years with endocrine therapy may be considered in DCIS that is ER/PR positive to reduce the risk of another breast event in the future in the ipsilateral breast or the contralateral breast. This is optional for patients as we are not trying to prevent metastatic disease but rather decrease the risk of another breast event. The recommendation to use this and the drug recommended is made on a case by case basis. Tamoxifen can be used in women of any menopausal status and AIs can be used in postmenopausal women. In the women with ER/PR pos DCIS in the NSABP B-24 trial, tamoxifen was shown to decrease rate of developing another DCIS by almost 35% and an invasive breat cancer by over 45% for a total decrease rate of developing any breast event of over 40% (32% decrease for ipsilateral recurrence and a 50% decrease for contralateral occurrence). In NSABP B-35 trial, an AI had a similar rate of decrease as compared to tamoxifen in postmenopausal women >= 21 years old. However in those postmenopausal women < 60, the AI had a further decrease of risk of second breast event by 45% as compared to tamoxifen. The difference in side effects needs to be considered. Side effects of tamoxifen can  include menopausal symptoms such as hot flashes, night sweats, vaginal dryness/irritation/discharge and decreased libido. It may also cause mood changes, weight changes, and hair thinning/loss. There is a roughly 0.5% increase in the incidence of uterine cancer, however this is more prevalent when given to women > 21-34 years old. There is also a slight 0.5-1.0% increase in the incidence of VTE with use of tamoxifen. Side effects of AIs may include myalgias and arthralgias that can range from minimal to fairly severe requiring cessation of medication, worsening bone density/increased risk of osteoporotic fractures, and menopausal symptoms like tamoxifen.     We work closely with a patient's surgical oncologist as well as radiation oncologist to devise the best plan for the patient. I have reviewed her medical record, pathology, labs and imaging and summarized them here.    1. 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 Healing well.  - Radiation can be used to decreased risk of a second left breast event. She will meet with rad/onc to determine the overall benefit of this.  - Chemoprevention is an option. We reviewed the options of tamoxifen and letrozole as detailed above. Treatment for 5 years can decrease risk of left breast event by roughly 1/3 and a right breast event by roughly 1/2. We will get a DEXA to help determine her baseline bone risk. We will NOT start a medication now, but first meet with rad/onc to discuss that first and then move forward.    Orders Placed This Encounter   Procedures    Dxa Bone Density Axial Skeleton     Standing Status:   Future     Number of Occurrences:   1     Standing Expiration Date:   05/04/2021     Order Specific Question:   Reason for Exam:     Answer:   assessment of postmenpausal bone loss     Order Specific Question:   Release to patient     Answer:    Immediate       Return in about 2 months (around 07/04/2020) for Dr.  Virl Son.    We discussed in depth: recommended diagnostic studies , test results , prognosis , treatment options , possible side effects , instructions to patient and/or family , educations to patient and/or family. All questions were answered. Thank you for this referral.    Burt Knack, MD  Breast Medical Oncology   [facility]  Main number: [phone]  [facility]: [phone]  Fax: [phone]            Answers for HPI/ROS submitted by the patient on 05/04/2020  fever: No  chills: No  Night sweats: Yes  fatigue: No  Unexpected weight loss: No  Unexpected weight gain: No  Appetite change: No  Frequent thirst: No  Double vision: No  Eye discharge: No  eye pain: No  Eye redness: No  hearing loss: No  Ringing in ears: No  ear pain: No  congestion: No  Sinus pain: No  Nose bleeds: No  sore throat: No  trouble swallowing: No  Voice change: No  Metallic or sour taste in mouth: No  difficulty breathing: No  Pain with breathing: No  Chronic cough: No  Bloody sputum: No  wheezing: No  Snoring: No  chest pain: No  palpitations: No  leg swelling: No  Pain walking: No  leg pain: No  Pacemaker problems: No  Irregular heartbeat: No  nausea: No  vomiting: No  diarrhea: No  abdominal pain: No  Bloody stool: No  constipation: Yes  heartburn: No  Regurgitation: No  Cold intolerance: No  Heat intolerance: No  Hot flashes: Yes  Hair loss: Yes  Difficulty urinating: No  urinary pain: No  Urinary frequency: No  bladder incontinence: No  Blood in urine: No  vaginal bleeding: No  vaginal discharge: No  pelvic pain: No  arthralgias: No  back pain: No  flank pain: No  myalgias: No  neck pain: No  Neck stiffness: No  muscle weakness: No  itching: No  rash: No  Wound: No  Skin changes: No  dizziness: No  headaches: No  seizures: No  numbness: No  tingling: No  tremors: No  weakness: No  memory loss: No  confusion: No  speech difficulty:  No  Bruise or bleeds easily: No  nervous/anxious: No  Depressed mood: No  Feeling agitated: No  Panic attack: No

## 2020-05-04 NOTE — Telephone Encounter (Signed)
Carling Liberman was referred to genetic counseling by Dr. Cyndi Bender and Dr. Virl Son on 04/07/2020 and 05/04/2020 for a DCIS diagnosis.    I left a voicemail with Helia Haese asking them to call us back so that we can begin the scheduling process.     I also sent them an email informing them about the referral.    Minus Liberty  Clinical Assistant  Ph: [phone]   Fax: [phone]

## 2020-05-12 DIAGNOSIS — Z1231 Encounter for screening mammogram for malignant neoplasm of breast: Secondary | ICD-10-CM | POA: Diagnosis not present

## 2020-05-15 ENCOUNTER — Ambulatory Visit
Admission: RE | Admit: 2020-05-15 | Discharge: 2020-05-15 | Disposition: A | Discharge status: Home / Self Care | Payer: Commercial Managed Care - POS | Source: Ambulatory Visit | Attending: Surgery | Admitting: Surgery

## 2020-05-15 DIAGNOSIS — D0512 Intraductal carcinoma in situ of left breast: Secondary | ICD-10-CM | POA: Insufficient documentation

## 2020-05-16 ENCOUNTER — Ambulatory Visit: Payer: Self-pay | Admitting: Radiation Oncology

## 2020-05-16 ENCOUNTER — Inpatient Hospital Stay: Admission: RE | Admit: 2020-05-16 | Payer: Self-pay | Source: Ambulatory Visit

## 2020-05-16 DIAGNOSIS — D0512 Intraductal carcinoma in situ of left breast: Secondary | ICD-10-CM

## 2020-05-16 NOTE — Progress Notes (Signed)
05/16/20 0900   Comfort Alteration   Karnofsky Performance Score 100%-normal, no complaints   Fatigue 1   Pain Score 1   Pain Loc BREAST   Pain Intervention 1-over-the-counter medications   Effectiveness of Pain Intervention 4-pain relieved 100%   Emotional Alteration   Coping 0-effective       Review of Systems   Constitutional: Negative.    HENT: Negative.    Eyes: Negative.    Respiratory: Negative.    Cardiovascular: Negative.    Gastrointestinal: Negative.    Genitourinary: Negative.    Musculoskeletal: Negative.    Skin: Negative.    Neurological: Negative.    Endo/Heme/Allergies: Negative.    Psychiatric/Behavioral: Negative.

## 2020-05-16 NOTE — Progress Notes (Signed)
RADIATION ONCOLOGY CONSULT NOTE    [name] [name]    DOB: 1956/05/10    MR: [id number]      Date of Service: 05/16/2020                At the request of Dr. 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      This 64 y.o.-year-old woman had a screening mammogram on 12/02/2019 at [facility] that revealed questionable calcifications in the left breast.  Additional views on 01/11/2020 revealed faint punctate calcifications with possible branching in the upper outer left breast measuring up to 1.2 cm.  Stereotactic biopsy on 01/25/2020 revealed grade 2 DCIS, ER/PR more than 90%.    Dr. Cyndi Bender took the patient to the operating room on 04/14/2020 for a left partial mastectomy.  At 1:00 in the left breast was an area of grade 2 DCIS seen as scattered foci in 4 of 9 glass slides.  The closest margin was 3 mm anteriorly.  Minimal necrosis was seen and there were no calcifications.  There was no LCIS.  Additional tissue from the deep margin and superior margin was negative for DCIS.  There was no invasive disease.    Postoperatively, she has been doing well.  She met with Dr. Burt Knack to discuss the role of endocrine therapy.    She is seen today to discuss treatment recommendations further.    She has no complaints today.     PAST MEDICAL HISTORY:    Past Medical History:   Diagnosis Date    Coronary artery disease, non-occlusive 2010    nonobstructive s/p cath 2010    Fibroid, uterine 05/2012    H/O echocardiogram 06/28/13    EF 65%, mild LVH    H/O mammogram 03/15/2102     normal     Has immunity to COVID-19 virus     Pfizer x2 doses last dose 10/23/19    Hypertensive disorder 2004    controlled-145/80    Left ventricular hypertrophy 2010    s/p echo    Malignant neoplasm 01/25/2020    Malignant neoplasm of breast     Ovarian cyst, right 05/2012    Pap  smear for cervical cancer screening 07/25/2101    no hx of abnormal pap smear    Post-operative nausea and vomiting     needs "patch"    Type 2 diabetes mellitus, controlled     Last A1C 6.3       Past Surgical History:   Procedure Laterality Date    ABDOMINAL SURGERY  1999    BIOPSY, BREAST, TUMOR EXCISION, ULTRASOUND NEEDLE LOCALIZATION Left 04/14/2020    Procedure: BIOPSY, BREAST, TUMOR EXCISION, ULTRASOUND NEEDLE LOCALIZATION;  Surgeon: Boykin Peek, MD;  Location: [facility];  Service: General;  Laterality: Left;    CARDIAC CATHETERIZATION  2010    non-obs CAD    CYSTOSCOPY Bilateral 05/07/2018    Procedure: CYSTOSCOPY;  Surgeon: Zackery Barefoot, MD;  Location: [facility];  Service: Gynecology Oncology;  Laterality: Bilateral;    EXCISION BIOPSY WITH NEEDLE LOCALIZATION  01/25/2020    HYSTERECTOMY  05/08/2019    INGUINAL HERNIA REPAIR  2004    right    LAPAROSCOPIC, HYSTERECTOMY, TOTAL, BSO Bilateral 05/07/2018    Procedure: LAPAROSCOPIC, HYSTERECTOMY, TOTAL, BSO, LYSIS OF ADHESIONS;  Surgeon: Zackery Barefoot, MD;  Location: [facility];  Service: Gynecology Oncology;  Laterality: Bilateral;    LAPAROSCOPIC, LYSIS, ADHESIONS  05/07/2018    Procedure: LAPAROSCOPIC, LYSIS, ADHESIONS;  Surgeon: Zackery Barefoot, MD;  Location: [facility];  Service: Gynecology Oncology;;    MYOMECTOMY  04/2014    TUBAL LIGATION  1984       She has no history of prior radiation therapy, lupus or scleroderma.  No history of pacemaker or other implanted electronic device.    MEDICATIONS:    Current Outpatient Medications   Medication Sig Dispense Refill    carvedilol (COREG) 25 MG tablet Take 2 tablets (50 mg total) by mouth 2 (two) times daily with meals. (Patient taking differently: Take 25 mg by mouth 2 (two) times daily with meals   ) 120 tablet 2    Cholecalciferol (VITAMIN D) 2000 UNITS Cap Take 2,000 Unit by mouth daily.      Januvia 50 MG tablet 50 mg daily         melatonin 3 mg tablet 1 tablet  at bedtime as needed      Multiple Vitamins-Minerals (multivitamin with minerals) tablet Take 1 tablet by mouth daily      spironolactone (ALDACTONE) 50 MG tablet Take 1 tablet (50 mg total) by mouth daily. (Patient taking differently: Take 25 mg by mouth nightly   ) 30 tablet 6    ascorbic acid (VITAMIN C) 1000 MG tablet Take 1,000 mg by mouth      docusate sodium (COLACE) 100 MG capsule Take 1 capsule (100 mg total) by mouth 2 (two) times daily 30 capsule 0    irbesartan-hydroCHLOROthiazide (AVALIDE) 300-12.5 MG per tablet Take 1 tablet by mouth nightly       No current facility-administered medications for this visit.        ALLERGIES:    Aspirin, Pravastatin, Levaquin [levofloxacin hemihydrate], Other, Sudafed [pseudoephedrine], and Tetanus immune globulin     FAMILY HISTORY:    Family History   Problem Relation Age of Onset    Breast cancer Mother 45        Passed away from metastatic breast cancer    Breast cancer Sister 61        DCIS    Heart failure Brother     Ovarian cancer Paternal Grandmother     Heart failure Sister     Bladder Cancer Brother     Leukemia Nephew 7        Brother's son.        BREAST CANCER RISK FACTORS:    Menarche age 64, G65 P2 with first delivery at age 27.  Menopause in 2013.  She was never on exogenous estrogens.  No history of prior breast biopsies or surgeries.    REVIEW OF SYSTEMS:    A 12-point review of systems form is reviewed with the patient and is negative.  The full ROS is documented in the patient's chart.    Pain score 0     SOCIAL HISTORY:    She is married and lives near [place] with her husband.  She is retired from a [employer] job.  She does not smoke and drinks alcohol occasionally.     PHYSICAL EXAMINATION:    Well-developed black female in no apparent distress.    BP 135/58    Pulse 68    Temp 97.2 F (36.2 C) (Temporal)    Resp 18    LMP 02/13/2012   There is no palpable supraclavicular or axillary lymphadenopathy  bilaterally.  The  breasts are large and moderately pendulous.  The patient is status post left breast surgery and the surgical scar is healing well.  There is a little bit of induration in the left breast surgical area, but otherwise the bilateral breasts are without masses or erythema.      Gait is normal and KPS is 100.  No lower extremity edema.      IMPRESSION:    A 64 y.o.-year-old female status post excision of an area of grade 2 DCIS from the left breast with negative resection margins, positive.    RECOMMENDATION:      Over the course of an hour I reviewed with the patient the differences between DCIS and invasive ductal carcinoma.  I emphasized that there was no invasive disease found on her biopsy or surgical specimen.  I explained that everything we do for DCIS is done to try to prevent an invasive recurrence in the future.  We reviewed the various local therapy options for women with DCIS including mastectomy, excision with radiation therapy, and excision alone.  Survival differences have not been found among these different options.  However, the risk of local recurrence can vary.  The risk of local recurrence after mastectomy for patients with pure DCIS is 1 to 2%.  The risk of recurrence with excision and radiation therapy is generally 10% or less, and about half of these recurrences are invasive while the other half are DCIS.  Excision alone can be associated with a risk of recurrence ranging from <10% - 35%, and again about half of these recurrences are invasive.    We discussed the role in process of adjuvant radiation therapy.  Patients treated with adjuvant whole breast irradiation typically develop breast erythema and/or hyperpigmentation, and fatigue during the  treatment.  There can be some mild breast pain, swelling or a feeling of warmth. Most patients experience resolution of these side effects within a few months of completing treatment.  However, some patients are left with hyperpigmentation and/or  erythema, or persistent breast pain.  Changes in the breast contour, skin thickening and some swelling  or shrinkage of the breast can occur.  More significant changes like fibrosis and telangiectasias are less common.  We discussed the low risk  of radiation pneumonitis, and secondary malignancy. We discussed the low risk of cardiac toxicity, and the potential use of deep inspiration breath hold for patients treated to the left breast to try to lower this even further.    We discussed the option of accelerated partial breast radiation therapy.  With this technique, only the surgical bed and the margins are treated typically every other day for 5 fractions.  Side effects are generally less, and most patients experience excellent local control and cosmetic outcome.  All of her questions were answered.    We discussed the development and role of the Prelude Dx test for DCIS.  We reviewed the limitations of this test, but that it sometimes can be helpful to guide whether adjuvant RT should be added.The patient is interested in having this test done, and it will be ordered.  I will discuss treatment recommendations with her further when we have these results.    If she opts to proceed with radiation therapy, we will want to get a repeat mammogram before radiation therapy begins.  If radiation therapy is needed, I am hopeful she will be a good candidate for accelerated partial breast radiation therapy.      Printed information  was provided to the patient , and she has my card if she has any further questions.  I will call her after the results from the Prelude DX test are back.              Miguel Dibble, MD  [facility]            60 minutes were spent in this visit.           This note was generated by a voice recognition system and may contain inherent errors or omissions not intended by the user. Grammatical errors, random word insertions, deletions, pronoun errors and incomplete sentences are  occasional consequences of this technology due to software limitations. Not all errors are caught or corrected. If there are questions or concerns about the content of this note or information contained within the body of this dictation they should be addressed directly with the author for clarification.

## 2020-05-31 ENCOUNTER — Ambulatory Visit
Admission: RE | Admit: 2020-05-31 | Discharge: 2020-05-31 | Disposition: A | Discharge status: Home / Self Care | Payer: Self-pay | Source: Ambulatory Visit

## 2020-05-31 ENCOUNTER — Other Ambulatory Visit: Payer: Self-pay

## 2020-05-31 DIAGNOSIS — Z1231 Encounter for screening mammogram for malignant neoplasm of breast: Secondary | ICD-10-CM

## 2020-06-01 ENCOUNTER — Ambulatory Visit
Admission: RE | Admit: 2020-06-01 | Discharge: 2020-06-01 | Disposition: A | Discharge status: Home / Self Care | Payer: Commercial Managed Care - POS | Source: Ambulatory Visit | Attending: Hematology & Oncology | Admitting: Hematology & Oncology

## 2020-06-01 ENCOUNTER — Telehealth: Payer: Self-pay | Admitting: Nurse Practitioner

## 2020-06-01 DIAGNOSIS — D0512 Intraductal carcinoma in situ of left breast: Secondary | ICD-10-CM | POA: Insufficient documentation

## 2020-06-01 NOTE — Telephone Encounter (Signed)
Radiation Oncology Telephone Note        Patient Name: [name] [name]    MRN: [id number]      Provider:  Hansel Starling, NP-C  Radiation Oncology Attending: Dr. Francena Hanly Hetelekidis       Per Dr. Scheryl Marten' request, spoke with [name] regarding her Prelude DX results.  The Prelude score returned at 0.8, which correlates with a 8% risk of local recurrence at 10 years.  With the addition of radiation, the risk of local recurrence would be 7%.  Given the low risk of recurrence, the benefit of adjuvant radiation thearpy is low and Dr. Scheryl Marten did not recommend adjuvant radiation therapy.  She was advised to follow up with her medical oncologist to diccuss pros and cons of endocrine therapy.  She also needs to follow up with her breast surgeon and continue with yearly mammograms.  She was very pleased and appreciative of the call.  She was advised to call our clinic if she has any further questions or concerns.  She verbalized understanding.                             Hansel Starling, MSN, AGNP-C, OCN       [facility]       [facility]       [id number]       [place], [place] [address]       T [phone]   F [phone]       [facility]

## 2020-06-14 ENCOUNTER — Ambulatory Visit: Payer: Commercial Managed Care - POS | Admitting: Hematology & Oncology

## 2020-06-14 DIAGNOSIS — Z6826 Body mass index (BMI) 26.0-26.9, adult: Secondary | ICD-10-CM | POA: Diagnosis not present

## 2020-06-14 DIAGNOSIS — Z01419 Encounter for gynecological examination (general) (routine) without abnormal findings: Secondary | ICD-10-CM | POA: Diagnosis not present

## 2020-06-27 ENCOUNTER — Telehealth (INDEPENDENT_AMBULATORY_CARE_PROVIDER_SITE_OTHER): Payer: Self-pay | Admitting: MS"

## 2020-06-27 NOTE — Telephone Encounter (Signed)
I reminded [name] [name] of appointment- voicemail.    Lauraine Rinne  Clinical Assistant  Ph: [phone]   Fax: [phone]

## 2020-06-28 ENCOUNTER — Other Ambulatory Visit (INDEPENDENT_AMBULATORY_CARE_PROVIDER_SITE_OTHER): Payer: Self-pay | Admitting: MS"

## 2020-06-28 ENCOUNTER — Encounter (INDEPENDENT_AMBULATORY_CARE_PROVIDER_SITE_OTHER): Payer: Self-pay | Admitting: MS"

## 2020-06-28 ENCOUNTER — Telehealth (INDEPENDENT_AMBULATORY_CARE_PROVIDER_SITE_OTHER): Payer: Commercial Managed Care - POS | Admitting: MS"

## 2020-06-28 DIAGNOSIS — Z170 Estrogen receptor positive status [ER+]: Secondary | ICD-10-CM

## 2020-06-28 DIAGNOSIS — C50912 Malignant neoplasm of unspecified site of left female breast: Secondary | ICD-10-CM

## 2020-06-28 DIAGNOSIS — Z8052 Family history of malignant neoplasm of bladder: Secondary | ICD-10-CM

## 2020-06-28 DIAGNOSIS — Z803 Family history of malignant neoplasm of breast: Secondary | ICD-10-CM

## 2020-06-28 DIAGNOSIS — Z8041 Family history of malignant neoplasm of ovary: Secondary | ICD-10-CM

## 2020-06-28 NOTE — Progress Notes (Signed)
Patient Name: [name] [name]  DOB: Oct 16, 1955      [name] [name] is a 64 y.o. female who was referred to [facility] due to a personal and/or family history of cancer.      Personal History of Cancer:          Family History of Cancer:  Family History   Problem Relation Age of Onset    Breast cancer Mother 96        bilateral breast cancer, d. metastatic breast    Multiple myeloma Mother 4    Breast cancer Sister 63        DCIS    Heart failure Brother     Ovarian cancer Paternal Grandmother 39    Heart failure Sister     Bladder Cancer Brother 18    Leukemia Nephew 7        Brother's son.    Bone cancer Maternal Grandmother 90             Hereditary Cancer Risk Assessment:          Cancer Screening and Risk Reduction Recommendations:          Information for Relatives:          Cancer Risk Management and/or Support Referrals:              [facility]        CC:No referring provider defined for this encounter.

## 2020-06-28 NOTE — Progress Notes (Signed)
Patient Name: [name] [name]  DOB: 11-06-1955    This visit is being conducted via video and telephone. Verbal consent has been obtained from the patient to conduct a video and telephone visit to minimize exposure to COVID-19: yes.  Time spent in discussion: 45 minutes      Myna Freimark is a 64 y.o. female referred by Dr. No ref. provider found for genetic counseling regarding her personal and family history of cancer. After discussion of the risks, benefits, and limitations of genetic testing, Rayola Everhart elected to pursue the 85-gene CustomNext Cancer panel from [facility]. I will call her in 2-3 weeks with the results of her testing.     Reason for Referral  Nitya Cauthon is a 64 y.o. female of African American ancestry who is referred by Dr. Cyndi Bender  for evaluation of a personal history of breast cancer, for a discussion regarding genetic testing and for recommendations for ongoing cancer surveillance and prevention.      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 She underwent a lumpectomy on 04/14/2020. She is meeting with Dr. Virl Son tomorrow to discuss taking Tamoxifen.     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 She also underwent a tubal ligation.     Lambert Keto relevant gynecologic history is as follows: She is post-menopausal, G3P2, FLB at 58. She reports no prior use of oral contraceptive pills and no prior use of HRT.     [name] reports performing the following cancer screening:    Breast: annual mammogram   Ovaries: no frequent screening   Colon: colonoscopy every 5 years, last one at the age of 15   Skin: no prior screening    Jrue Yambao understands that genetic testing may inform her risk of additional  cancer(s). She is aware of possible changes to her medical management, including preventative surgeries,  if testing were to show a mutation in a high-risk gene.     Family History  Arlene Brickel Baldo's family history is significant for the following:    Family History   Problem Relation Age of Onset    Breast cancer Mother 24        bilateral breast cancer, d. metastatic breast    Multiple myeloma Mother 78    Breast cancer Sister 60        DCIS    Ovarian cancer Paternal Grandmother 6    Bladder Cancer Brother 50    Leukemia Nephew 7        Brother's son.    Bone cancer Maternal Grandmother 90     Please see scanned pedigree under media tab for additional details of the family structure.    Assessment   Lenette Rau Cooler's personal and family history warrants consideration of an inherited susceptibility to cancer.  Suggestive features include:   (a) her diagnosis of breast cancer;  (b) her paternal family history of ovarian cancer   (c) her sister's breast cancer diagnosis and her mother's bilateral breast cancers.     Approximately 5-10% of breast cancer cases are attributable to hereditary risk. Features suggestive of inherited risk include young age at diagnosis (typically <50y), triple negative pathology, or if an individual is affected with breast cancer and has additional close  relative(s) diagnosed with breast cancer and/or other related cancers (including ovarian, pancreatic, or aggressive prostate). Marguerite Olea Joness personal history of breast cancer and reported family history causes [place] to consider the possibility for her to carry a mutation in a breast cancer susceptibility gene.     We discussed that there are different types of breast cancer susceptibility genes, including high risk (i.e. BRCA1 and BRCA2, PALB2), moderate risk (ATM, CHEK2) and newly described. Individuals that have a mutation in a gene in the latter two categories are more difficult to predict at the present time, given  that mutations in these genes present with a variable clinical picture in affected families.     Breast Cancer Susceptibility Gene / Condition   Lifetime Risk of Breast   Cancer  Other associated cancer risks / features    BRCA1, BRCA2     Hereditary Breast and Ovarian Cancer (HBOC) syndrome  50-60%     BRCA1 mutations often associated with triple negative pathology  Ovarian, pancreatic, female breast, melanoma, prostate    CDH1     Hereditary Diffuse Gastric Cancer (HDGC) syndrome  39-55%     Often lobular pathology  Diffuse gastric cancer    PALB2  Up to 53%     Associated with triple negative pathology  Pancreatic cancer    PTEN     PTEN-Hamartoma Tumor Syndrome  25-50%  Uterine, thyroid, kidney, colorectal cancer, other features    TP53       Burgess Amor syndrome  Significantly increased (over 50%)       Often diagnosed in 20s-30s, can be associated with triple positive (ER PR + Her 2 + ) pathology  Sarcoma, brain, adrenocortical, leukemia, others    ATM 24-48%  Pancreatic, prostate cancer    CHEK2  18-36% (frameshift mutations)      **Risk is mutation-specific  Colon, prostate cancer    STK11     Peutz-Jeghers syndrome  ~40-60% Breast, colon, small bowel, pancreatic    BARD1, FANCC, NBN, NF1, XRCC2  Increased (lower penetrance)   --    BRIP1, RAD51C, RAD51D  Increased    Associated with triple negative pathology  Ovarian cancer      Dorethia Jeanmarie appears to be an appropriate candidate for genetic testing for hereditary cancer risk. she appears to meet current NCCN testing criteria for high penetrance breast and/or ovarian cancer susceptibility genes, based upon the reported personal and family history of breast and ovarian cancer. A multi-gene panel is recommended to assess the potential for Shital Crayton to carry a mutation in one of several known cancer susceptibility genes. The identification of a mutation would guide the most appropriate cancer screening and prevention recommendations for Tongela Encinas and her family members.     Content of discussion  We discussed the difference between sporadic versus hereditary cancers and the relative frequencies of these.      We reviewed the availability of multi-gene panels and the varying levels of cancer risk associated with different genes. Broadly, we consider some genes to be "high risk" while others cause a more "moderate" level of risk.  Lastly, there are some "newly described" genes for which the level of cancer risk is not yet well understood.    We reviewed possible genetic test results, including positive, negative and inconclusive. Much of our discussion focused on the medical implications of a positive genetic test result, including risk(s) for possibly more than one type of cancer and the options for  management of increased cancer risks including elevated surveillance, risk-reducing surgery and chemoprevention.  I explained that effective screening and/or risk-reducing options may not exist for all of the increased cancer risks associated with the hereditary cancer susceptibility genes.     [name] understood the limitations in some of the currently available data, that recommendations for cancer screening and/or risk reduction are tailored to both the genetic test result and the personal/family history, and that her age and other factors need to be taken into account when trying to determine the degree of benefit that she may derive from risk-reducing surgery.       We discussed the fact that "negative" test results are of limited value unless a mutation has already been identified in the family. There is a significant population risk for cancer, and there may be unidentified genes responsible for the observed personal and family history.  Increased cancer surveillance (or prevention interventions) may be appropriate even after negative results, based upon empiric estimates of risk. There is a chance that a mutation in a particular  cancer susceptibility gene may not be detected, even if one is present, due to either technical limitations or laboratory error.     I also explained that the test result may identify a variant of uncertain significance.  These "variants" may or may not be associated with an increased cancer risk, and the optimal management of families transmitting such variants has not been defined.  The majority of variants of uncertain significance are ultimately reclassified as benign (not associated with an increased cancer risk).  Screening and prevention recommendations are therefore based on someone's personal and family histories.    We specifically discussed the 85-gene CustomNext Cancer panel with RNA from [facility].   With a panel there is an approximately 25% likelihood of finding a variant of unknown significance in one or more of the genes included.      We discussed the fact that genetic testing would be recommended for her immediate relatives (siblings and children) if her test result is positive.  In this case, each of them would have a 50% likelihood of sharing the mutation. In most cases, genetic testing for cancer susceptibility is not recommended for individuals under age 48 years unless the result will alter their medical management before this age.  Family members who choose not to be tested may be able to infer their mutation status from that of others tested.      I also discussed our policies for documentation of genetic test results in the medical record and the theoretical possibility of employment and insurance discrimination based on genetic test results.    Psychosocial Considerations  Genetic testing may have significant psychological implications for both individuals and families.  During the session, Manya Balash demonstrated understanding of the material presented. She raised appropriate questions, which were answered to the best of my ability.     Plan for genetic testing  Jami Bogdanski is aware that genetic testing may indicate an increased risk for cancers unrelated to her personal and/or family history. [name] indicated intention to follow medical management guidelines provided on the basis of genetic testing and/or her personal/family history, as appropriate. She reports intention to share the results of genetic testing and relevant management guidelines with her relatives as recommended.     After this discussion, Mackenzie Groom demonstrated appropriate understanding of the material covered and elected to pursue genetic testing. Linnet Bottari will be securely  e-mailed the appropriate consent document(s) for her digital signature. Concurrently, we will submit the test requisition form to Marguerite Olea Olivo's referring provider for their review and signature. She elected to undergo a blood draw and is scheduled for collection tomorrow at the [facility].  [name] expressed understanding of and agreement to this plan.     Expected turn-around-time for the test is 2 to 3 weeks from sample submission.    The insurance and billing process was reviewed.  Patient is aware there may be additional delays in the testing process given current events.      Screening recommendations and plan for follow-up  I will discuss screening and prevention recommendations when genetic test results are available to inform that discussion.     Conclusion   It was a pleasure to meet with [name] and I remain available for any additional questions that arise while we wait for genetic test results. I will contact [name] by phone when her results are available.  As has been discussed with the patient, if she is unavailable and can't be reached on multiple occasions, I will disclose the result directly to the referring physician.    Lorra Hals, [facility], CGC  Licensed and Interior and spatial designer [facility]  A Division of [facility]  Tel: [phone]  Fax: [phone]  Email: [email or web address]

## 2020-06-29 ENCOUNTER — Ambulatory Visit: Payer: Commercial Managed Care - POS | Attending: Hematology & Oncology | Admitting: Hematology & Oncology

## 2020-06-29 ENCOUNTER — Other Ambulatory Visit: Payer: Commercial Managed Care - POS

## 2020-06-29 ENCOUNTER — Encounter: Payer: Self-pay | Admitting: Hematology & Oncology

## 2020-06-29 VITALS — BP 148/80 | HR 70 | Temp 98.0°F | Resp 18 | Ht 64.0 in | Wt 221.1 lb

## 2020-06-29 DIAGNOSIS — Z170 Estrogen receptor positive status [ER+]: Secondary | ICD-10-CM

## 2020-06-29 DIAGNOSIS — D0512 Intraductal carcinoma in situ of left breast: Secondary | ICD-10-CM

## 2020-06-29 LAB — GENETICS COUNSELING TEST COLLECTION KIT (FOR ISCI ONLY)

## 2020-07-17 ENCOUNTER — Encounter (INDEPENDENT_AMBULATORY_CARE_PROVIDER_SITE_OTHER): Payer: Self-pay | Admitting: MS"

## 2020-07-17 NOTE — Progress Notes (Signed)
Genetic Testing Result Disclosure     Patient Name: Tessia Kassin  Patient Date of Birth: 08-16-1955  Date: 07/17/2020  Length of Conversation: 5 minutes     I called [name] on 07/17/2020 on behalf of my colleague Lorra Hals [place], CGC to discuss the results of genetic testing performed to investigate her personal and family history of cancer. [name] elected to pursue the 85-gene CustomNext-Cancer with RNAinsight panel from [facility].  Zyona Pettaway Colantuono's results were NEGATIVE and therefore DO NOT indicate an inherited predisposition for cancer. We discussed that [name] and her relatives are recommended to be managed based on the reported personal and/or family history and to share this history with their doctors.     Follow-up will include:  1. Norwood Levo will review Estefany Goebel Uriegas's results at case conference to finalize screening recommendations  2. Norwood Levo will write a detailed summary note that includes interpretation of the test result, screening recommendations for Beauty Pless and/or her relatives, etc.  3. Elide Stalzer will be sent a copy of her genetic test results, Dina's initial consult note, and Dina's final note in the mail.  4. These records will also be sent to Marguerite Olea Benavidez's referring provider and her PCP.     Bevelyn Buckles, MS, CGC  Licensed and Certified Genetic Counselor  [facility]  [facility]  Cancer Genetics Program  Tel: [phone]  Email: [email or web address]  Fax: [phone]

## 2020-07-28 ENCOUNTER — Encounter (INDEPENDENT_AMBULATORY_CARE_PROVIDER_SITE_OTHER): Payer: Self-pay | Admitting: MS"

## 2020-08-07 NOTE — Progress Notes (Signed)
Patient Name: [name] [name]  DOB: Jan 21, 1956      [name] [name] is a 65 y.o. female, who was referred based on a personal and family history of cancer. An 85-gene CustomNext Cancer panel with RNA was ordered from [employer] [employer] at our initial meeting.  Her  results were negative, and therefore do not indicate an inherited predisposition for cancer.  The interpretation of the patient's genetic testing result is provided in detail below.       TEST PERFORMED:  [name] [name] pursued  an 85-gene CustomNext Cancer panel with RNA from [facility]:     Genes Analyzed (85 total): AIP, ALK, APC, ATM, AXIN2, BAP1, BARD1, BLM, BMPR1A, BRCA1, BRCA2, BRIP1, CDC73, CDH1, CDK4, CDKN1B, CDKN2A, CHEK2, CTNNA1, DICER1, FANCC, FH, FLCN, GALNT12, KIF1B, LZTR1, MAX, MEN1, MET, MLH1, MRE11A, MSH2, MSH3, MSH6, MUTYH, NBN, NF1, NF2, NTHL1, PALB2, PHOX2B, PMS2, POT1, PRKAR1A, PTCH1, PTEN, RAD50, RAD51C, RAD51D, RB1, RECQL, RET, SDHA, SDHAF2, SDHB, SDHC, SDHD, SMAD4, SMARCA4, SMARCB1, SMARCE1, STK11, SUFU, TMEM127, TP53, TSC1, TSC2, VHL and XRCC2 (sequencing and deletion/duplication); EGFR, EGLN1, FAM175A, HOXB13, KIT, MITF, MLH3, PALLD, PDGFRA, POLD1, POLE, RINT1, RPS20 and TERT (sequencing only); EPCAM and GREM1 (deletion/duplication only). RNA data is routinely analyzed for use in variant interpretation for all genes.      My colleague [name] [name] contacted [name] [name] to review the results of this test by phone.    TEST RESULTS:  The results were negative, meaning that no mutations were identified in any of the genes analyzed.  Further, no variants of unknown significance were identified.           RESULT INTERPRETATION:  This negative result significantly reduces the likelihood that [name] [name]'s breast cancer developed due to an inherited gene mutation. However, I explained to [name] [name] that this negative result unfortunately does not completely rule out this possibility:    1. There  could be a mutation in one of the genes that the test did not detect, although the likelihood of this is low;  2. There could be a mutation in a gene that was not analyzed, either known or unknown;  3. It is possible that there is a hereditary risk present in other relatives that [name] [name] did not inherit.   4. Lastly, [name] [name]'s personal  history of cancer may have developed due to a combination of genetic and other environmental factors that cannot be specifically defined at this time.      However, because [name] [name]'s personal and/or family history is still suggestive of an inherited susceptibility to cancer, I remain concerned despite her negative genetic test result. [name] [name]'s paternall family history of ovarian cancer diagnosed in [name] [name]'s grandmother in the context of a limited family structure may be suggestive of a remaining hereditary predisposition to cancer in her relatives that she has not inherited. On her maternal side, her mother's bilateral breast cancer may be suggestive of remaining inherited cancer risk on this side as well.     While the likelihood is extremely small, there is always the possibility of human or laboratory error in performing a genetic test. The laboratories we utilize take every effort to maximize the accuracy of the tests performed. Since genetic test results have important implications for medical management, patients may wish to consider having the test repeated by submitting a fresh sample. Repeat testing may be particularly useful for unaffected individuals who are considering risk-reducing surgery based on  positive results, or for individuals who test negative for a known mutation in the family.     Some individuals have different genetic variants within or between tissues (i.e., blood/saliva versus skin). The test may not detect genetic variants that are only present in some cells or tissues.    CANCER RISK(S),  SCREENING AND/OR PREVENTION RECOMMENDATIONS for [name] [name] is aware that, even though a mutation was not identified on her genetic test, she may still be at increased risk for developing additional cancer for the reasons noted above. At this time we recommend that cancer screening and prevention be based on [name] [name]'s personal and family history of cancer.      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 She underwent a lumpectomy on 04/14/2020.     She underwent a total hysterectomy with bilateral salpingo-oophorectomy (TH/BSO) at the age of 19 indicated by having a uterine leiomyoma and a right sided ovarian cystadenofibroma. She also underwent a tubal ligation.     [name] [name] relevant gynecologic history is as follows: She is post-menopausal, G3P2, FLB at 89. She reports no prior use of oral contraceptive pills and no prior use of HRT.     [name] [name] reports performing the following cancer screening:    Breast: annual mammogram   Ovaries: no frequent screening   Colon: colonoscopy every 5 years, last one at the age of 9   Skin: no prior screening    FAMILY HISTORY   Family History   Problem Relation Age of Onset    Breast cancer Mother 83        bilateral breast cancer, d. metastatic breast    Multiple myeloma Mother 48    Breast cancer Sister 75        DCIS    Heart failure Brother     Ovarian cancer Paternal Grandmother 67    Heart failure Sister     Bladder Cancer Brother 41    Leukemia Nephew 7        Brother's son.    Bone cancer Maternal Grandmother 47       The following recommendations were reviewed with colleagues at our cancer genetics case conference. [name] [name] should review these recommendations in detail with her physicians to finalize a cancer screening plan.  All recommendations should be considered with regard to her personal  history and current health. Recommendations may change as personal and/or family histories of cancer change, as additional genetic test results become available for [name] [name] or relative(s) or as medical science advances. It is very important to note that surveillance (or cancer screening), no matter how well or carefully performed, does not guarantee that cancer will be detected at a stage that is curable or that will require minimal treatment.     BREAST   A woman's risk for developing another primary breast cancer depends on several factors, including her current age and her age at first breast cancer diagnosis, menopausal status and other reproductive factors, the treatment she received for her first breast cancer and certain lifestyle choices such as tobacco and alcohol use.     [name] [name]'s genetic test result does not clearly suggest that she has inherited an increased risk for another primary breast cancer. I encouraged [name] [name] to discuss her estimated risk for another primary breast cancer based  on other factors with her physicians.    [name] [name] underwent a lumpectomy and should continue to follow the screening regimen for remaining breast tissue as determined by her physician(s).       OVARIES and UTERUS   Shawnika Pepin has previously had her uterus and ovaries surgically removed and should continue to follow up as recommended by her physician(s).     COLON   The general population (average) lifetime risk for colorectal cancer is approximately 4.5%. Studies have shown that a family history of colorectal cancer increases a person's risk.  The number of affected relatives, their degree of relationship and their age(s) at diagnosis should all be considered.    [name] [name] reports no personal history of colon polyps or family history of colon cancer.  Kaysa Roulhac should continue to undergo screening as directed by her gastroenterologist. her last  colonoscopy was at age 89, and Nevelyn Mellott plans to follow up as recommended on a 5 year interval.    SKIN   Given the high frequency of skin cancer in the general population, it is important that [name] [name] perform skin exams and also have her doctor perform these exams on a regular basis.    LIFESTYLE MODIFIERS: In addition to family history, lifestyle factors may also influence cancer risk. These include diet, exercise and exposure to tobacco and alcohol. It is therefore important that [name] [name] maintain a well-balanced diet, try to exercise on a regular basis, limit alcohol consumption, and also avoid tobacco use.      ADDITIONAL GENETIC TESTING RECOMMENDATIONS FOR [name] [name]:  Maddelyn Rocca is not a clear candidate for additional genetic testing at this time given that she received an expanded multi-gene panel. However, she will remain a reasonable candidate for additional testing in the future as more cancer predisposition genes are identified.    Genetic testing is recommended for Sharonann Malbrough Greenawalt's relative(s) - please see below.    INFORMATION FOR Shadana Pry Finlayson's RELATIVES:    Based on this test result, Brianda Beitler is not expected to have passed down a detectable mutation in any of the genes analyzed to her children.      Given remaining concern about possible hereditary risk as noted above, I recommend consideration of additional genetic testing for Wylee Ogden Bango's relatives. The best candidate(s) for genetic testing in the family are her siblings, particularly her affected siblings, and any paternal relatives.  The rationale for this would be to evaluate the possibility of a definable gene mutation present in other relatives that Marshae Azam has not inherited.  Identification of a mutation in a relative would not only provide useful information for that particular relative (and others), it may allow [place] to reinterpret Landon Truax  Spargur's negative genetic test result with significantly more reassurance than is appropriate at this time.      All of Biance Moncrief Swickard's relatives should pursue cancer screening as recommended by their physicians based on their personal histories and - at a minimum - the general population recommendations.  I further recommend that they review the family history of cancer, Alicya Bena Bresee's genetic test results and have a discussion with their physicians about whether additional increased cancer screening and/or prevention is recommended.  Relatives may benefit from individualized genetic counseling to discuss personalized screening recommendations.      FOLLOW UP:  Although Lakechia Nay Barse's test results were negative, we discussed the fact  that it is normal to still have questions as to why she and/or her family members may have developed cancer.  If she and/or her physicians have other questions about her genetic testing and the material that was discussed we would be more than happy to have additional conversation(s).    1. Because information about cancer genetics is constantly changing and evolving, I encouraged Joelle Roswell to contact me via phone or e-mail every 12 months to determine if there may be any new major updates or advances that are relevant for her. Ideally, she should also make [place] aware of any changes in her address or phone number so we can reach her in the future.  2. [name] [name] should let us know if there are any changes to her personal and/or family histories, including if anyone else in the family pursues genetic testing for hereditary cancer risk (no matter what the result) as these could influence the current risk assessment.  As noted above, genetic testing is recommended for relative(s).  3. It is often important to try to review medical, pathology and/or death certificates from family members that have been diagnosed with cancer, as this information may have  an impact on the risk assessment provided to [name] [name]. We would be interested in particular in reviewing records from [name] Olea Baade's affected relatives.  As discussed, we can provide [name] [name] with medical record request forms for her family members' records if she and/or they (their next of kin) are interested in and willing to participate in this process.  4. As the field of cancer genetics is rapidly advancing, Alichia Alridge and/or another family member may wish to consider storing genetic material (DNA) for the possibility of future genetic testing.  We recommend contacting [facility], a DNA banking facility at [email or web address] or [phone] for more information.  We are available if Abir Eroh has any general questions about DNA banking.    A copy of the test results was provided to [name] [name] and was also scanned into her [facility] electronic medical record as discussed. Copies can be sent to non-[place] physician(s) as requested.      It was a pleasure to provide genetic counseling to [name] [name].  I look forward to staying in touch in the future and I remain available for any questions that arise.      Lorra Hals, [facility], CGC  Licensed and Interior and spatial designer [facility]  A Division of [facility]  Tel: [phone]  Fax: [phone]  Email: [email or web address]

## 2020-08-15 ENCOUNTER — Encounter (HOSPITAL_BASED_OUTPATIENT_CLINIC_OR_DEPARTMENT_OTHER): Payer: Self-pay

## 2020-09-05 ENCOUNTER — Encounter: Payer: Self-pay | Admitting: Hematology & Oncology

## 2020-09-05 NOTE — Progress Notes (Signed)
MEDICAL ONCOLOGY PROGRESS NOTE    Patient Name: [name] [name]  Patient DOB:   May 29, 1956    Date of Service: 06/29/2020  Provider Name : Donnamarie Rossetti, MD         Breast Medical Oncology, [facility]    Reason for Visit: Left breast DCIS    I have reviewed her medical record, pathology, labs and imaging and summarized them here.    ONCOLOGY HISTORY:  I. 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 Biopsy 01/25/20 with DCIS, ER/PR>90%  - S/p LPM 04/14/2020: DCIS; margins clear; solid grade 2; necrosis minimally present  - Prelude test with 0.8; low risl. Risk of a local recurrence in 10 years is 8% and with radiation is 7%. No radiation was done.    History of Present Illness:  [name] [name] "[name]" is a 65 y.o. female with Left breast DCIS here for medical oncology f/up.   - Since her last visit she had her rad/onc consultation. Her Prelude returned as low risk with minimal benefit to radiation. Therefore radiation was ultimately NOT recommended.  - She has met with genetics and that testing is currently pending.  - She is here today to consider chemoprevention.    - She has no history of arthritis or bone density issues.  - She does have a strong cardiac history with CAD, HTN, DM, obesity. No hx of MI. Does follow with cardiology.    Doctors  Breast Surgeon: Henderson Baltimore, MD  Rad Onc: Kerby Less, MD  PCP: Terrilee Files, MD    ROS: Please see hpi. All others reviewed and are negative.    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    stage 0    Fibroid, uterine 05/2012    H/O echocardiogram 06/28/13    EF 65%, mild LVH    Has immunity to COVID-19 virus     Pfizer x2 doses last dose 10/23/19    Hypertensive disorder 2004     controlled-145/80    Left ventricular hypertrophy 2010    s/p echo    Obesity (BMI 35.0-39.9 without comorbidity)     Ovarian cyst, right 05/2012    Pap smear for cervical cancer screening 07/25/2101    no hx of abnormal pap smear    Post-operative nausea and vomiting     needs "patch"    Type 2 diabetes mellitus, controlled     Last A1C 6.3       Past Surgical History:   Procedure Laterality Date    ABDOMINAL SURGERY  1999    BIOPSY, BREAST, TUMOR EXCISION, ULTRASOUND NEEDLE LOCALIZATION Left 04/14/2020    Procedure: BIOPSY, BREAST, TUMOR EXCISION, ULTRASOUND NEEDLE LOCALIZATION;  Surgeon: Boykin Peek, MD;  Location: [facility];  Service: General;  Laterality: Left;    CARDIAC CATHETERIZATION  2010    non-obs CAD    CYSTOSCOPY Bilateral 05/07/2018    Procedure: CYSTOSCOPY;  Surgeon: Zackery Barefoot, MD;  Location: [facility];  Service: Gynecology Oncology;  Laterality: Bilateral;    EXCISION BIOPSY WITH NEEDLE LOCALIZATION  01/25/2020    INGUINAL HERNIA REPAIR  2004    right    LAPAROSCOPIC, HYSTERECTOMY, TOTAL, BSO Bilateral 05/07/2018  Procedure: LAPAROSCOPIC, HYSTERECTOMY, TOTAL, BSO, LYSIS OF ADHESIONS;  Surgeon: Zackery Barefoot, MD;  Location: [facility];  Service: Gynecology Oncology;  Laterality: Bilateral;    LAPAROSCOPIC, LYSIS, ADHESIONS  05/07/2018    Procedure: LAPAROSCOPIC, LYSIS, ADHESIONS;  Surgeon: Zackery Barefoot, MD;  Location: [facility];  Service: Gynecology Oncology;;    MYOMECTOMY  04/2014    TUBAL LIGATION  1984       OB History     Gravida   2    Para   2    Term   2    Preterm        AB        Living           SAB        IAB        Ectopic        Multiple        Live Births               Obstetric Comments   Menarche 51. Menopause 57. G3P2. No HRT.             Family History   Problem Relation Age of Onset    Breast cancer Mother 37        bilateral breast cancer, d. metastatic breast    Multiple myeloma Mother 44    Breast cancer Sister 54        DCIS     Heart failure Brother     Ovarian cancer Paternal Grandmother 31    Heart failure Sister     Bladder Cancer Brother 57    Leukemia Nephew 7        Brother's son.    Bone cancer Maternal Grandmother 30       Social History     Tobacco Use    Smoking status: Never Smoker    Smokeless tobacco: Never Used   Vaping Use    Vaping Use: Never used   Substance Use Topics    Alcohol use: Yes     Alcohol/week: 1.0 standard drink     Types: 1 Glasses of wine per week     Comment: only on occasion    Drug use: No     Social History     Social History Narrative    Not on file       Allergies   Allergen Reactions    Aspirin Shortness Of Breath and Rash    Pravastatin Other (See Comments)     Myalgias    Levaquin [Levofloxacin Hemihydrate]      Joint pains    Other Swelling     Antihistamines    Sudafed [Pseudoephedrine] Swelling    Tetanus Immune Globulin      Skin reaction       Current Outpatient Medications on File Prior to Visit   Medication Sig Dispense Refill    ascorbic acid (VITAMIN C) 1000 MG tablet Take 1,000 mg by mouth      carvedilol (COREG) 25 MG tablet Take 2 tablets (50 mg total) by mouth 2 (two) times daily with meals. (Patient taking differently: Take 25 mg by mouth 2 (two) times daily with meals   ) 120 tablet 2    Cholecalciferol (VITAMIN D) 2000 UNITS Cap Take 2,000 Unit by mouth daily.      Januvia 50 MG tablet 50 mg daily         melatonin 3 mg tablet 1 tablet  at bedtime as needed      Multiple Vitamins-Minerals (multivitamin with minerals) tablet Take 1 tablet by mouth daily      olmesartan-hydrochlorothiazide (BENICAR HCT) 20-12.5 MG per tablet       spironolactone (ALDACTONE) 50 MG tablet Take 1 tablet (50 mg total) by mouth daily. (Patient taking differently: Take 25 mg by mouth nightly   ) 30 tablet 6    docusate sodium (COLACE) 100 MG capsule Take 1 capsule (100 mg total) by mouth 2 (two) times daily 30 capsule 0    irbesartan-hydroCHLOROthiazide (AVALIDE) 300-12.5 MG per  tablet Take 1 tablet by mouth nightly       No current facility-administered medications on file prior to visit.       Physical Exam:  Vitals:    06/29/20 1001   BP: 148/80   BP Site: Right arm   Patient Position: Sitting   Cuff Size: Large   Pulse: 70   Resp: 18   Temp: 98 F (36.7 C)   TempSrc: Oral   SpO2: 99%   Weight: 100.3 kg (221 lb 1.6 oz)   Height: 1.626 m (5\' 4" )     Body mass index is 37.95 kg/m.  Body surface area is 2.13 meters squared.  ECOG 0       CONSTITUTIONAL: This is a well developed, overweight, female in NAD, appears stated age.   HENT: NC/AT, MMM, no oral lesions.   EYES: EOMI, sclera anicteric.   NECK: Supple, ROM normal, non-tender.  CARDIAC: Regular rate and rhythm. Normal S1, S2. No murmurs, rubs or gallops.   PULMONARY: Lungs are CTAB without crackles/wheezes.  BREAST: Large breast volume bilaterally; right breast is benign, left breast with fully healed incision.   LYMPH: No cervical, occipital, supraclavicular LAD.  ABDOMINAL: soft, NT, ND. No HSM. NABS. No guarding, rebound.   EXTREMITIES: Full ROM in all extremities. No c/c/e.  SKIN: Skin is warm and dry, not diaphoretic. No ulcerations or erosions.   NEUROLOGIC EXAM: A&OX3. CN II-XII intact. No focal deficits. Gait and posture wnl.    PSYCHIATRIC: Appropriate mood and affect    Labs:  Reviewed in epic today.    Studies/Pathology:  Reviewed in epic today.  12/12/19: B/l screening mammo; Left calcifications. BIRADS 0     01/11/20: Left dx mammo: Left UOQ calcifications. Biopsy recommended.     01/25/20: Left breast biopsy: DCIS intermediate nuclear grade, with necrosis and calcifications; ER>90%, PR>90%    04/14/20: LPM: DCIS; margins clear; solid grade 2; necrosis minimally present    06/01/20: DEXA: T score 0.8, normal    Assessment/Plan:    Kaytee Taliercio is a 65 y.o. female with left breast DCIS here for medical oncology f/up.     Chemoprevention for 5 years with endocrine therapy may be considered in DCIS that is ER/PR positive  to reduce the risk of another breast event in the future in the ipsilateral breast or the contralateral breast. This is optional for patients as we are not trying to prevent metastatic disease but rather decrease the risk of another breast event. The recommendation to use this and the drug recommended is made on a case by case basis. Tamoxifen can be used in women of any menopausal status and AIs can be used in postmenopausal women. In the women with ER/PR pos DCIS in the NSABP B-24 trial, tamoxifen was shown to decrease rate of developing another DCIS by almost 35% and an invasive breat cancer by over 45% for a total  decrease rate of developing any breast event of over 40% (32% decrease for ipsilateral recurrence and a 50% decrease for contralateral occurrence). In NSABP B-35 trial, an AI had a similar rate of decrease as compared to tamoxifen in postmenopausal women >= 48 years old. However in those postmenopausal women < 60, the AI had a further decrease of risk of second breast event by 45% as compared to tamoxifen. The difference in side effects needs to be considered. Side effects of tamoxifen can include menopausal symptoms such as hot flashes, night sweats, vaginal dryness/irritation/discharge and decreased libido. It may also cause mood changes, weight changes, and hair thinning/loss. There is a roughly 0.5% increase in the incidence of uterine cancer, however this is more prevalent when given to women > 54-34 years old. There is also a slight 0.5-1.0% increase in the incidence of VTE with use of tamoxifen. Side effects of AIs may include myalgias and arthralgias that can range from minimal to fairly severe requiring cessation of medication, worsening bone density/increased risk of osteoporotic fractures, and menopausal symptoms like tamoxifen.     1. 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   - Radiation was NOT indicated given her low prelude  score and low benefit of a local recurrence (10 year risk of 8% without radiation and 7% with radiation)  - Chemoprevention was again discussed today.  --- Based on the prelude score (in which only 1/4 roughly took endorine therapy), I estimated that her benefit with chemoprevention would be to lower her risk from 8-10% to 4-5%.  --- Based on this, I estimated that the right breast would have a risk of 5% with no pills and down to 3% with chemoprevention.  --- Per the [facility] DCIS nomogram, her 5 year frisk of a local recurrence with nothing was 12% and 10 yar was 18% and this would go to 6% and 9% with endocrine therapy. However her prelude score sugguests these numbers are too high.  --- Ultimately, for a benefit of a few percentage points, the patient has opted AGAINST starting endocrine therapy, which is very reasonable.  - Did agree that lifestyle interventions like diet, weight loss and exercise would be very helpful for reducing risk of a breast event and helping her overall health.    B) Genetics: Testing pending  C) Follow up:  - No medical oncology follow up is indicated  - Patient will continue to follow up with surgery and defer imagine gto them.      Return for None, discharged from oncology clinic.    All questions were answered.     Burt Knack, MD  Breast Medical Oncology   [facility]  Main number: [phone]  [facility]: [phone]  Fax: [phone]

## 2020-09-26 ENCOUNTER — Telehealth (HOSPITAL_BASED_OUTPATIENT_CLINIC_OR_DEPARTMENT_OTHER): Payer: Self-pay

## 2020-09-26 ENCOUNTER — Telehealth: Payer: Self-pay | Admitting: Surgery

## 2020-09-26 NOTE — Telephone Encounter (Signed)
Called patient regarding her bill question and explain to her the billing process and she understood.

## 2020-10-20 NOTE — Progress Notes (Signed)
[facility] Breast Surgery Breast Cancer Follow Up    Treatment Team  Ref Prov:Lin, Luiz Ochoa, MD Primary Care Physician :  Terrilee Files, MD Radiology facility:[facility])--[phone] Breast Surgeon :Henderson Baltimore MD  [phone]   Plastic Surgeonnone Radiation Onc:  Miguel Dibble MD: [phone] [facility] [place]); Medical Onc:  Dr. Burt Knack      Breast Cancer Comprehensive Care Plan Summary  Date of diagnosis:01/25/2020 Event : DCIS ECOG Performance:Grade 0 (Fully active) Genetic Testing: negative   Presentation :Abnormal mammogram with microcalcifications Side:left Focality: unifocal Location:lateral region   Biologic Tumor characteristics  Tumor:Ductal Carcinoma In-situ Grade:nuclear grade II Ki-67:DCIS - not indicated Oncotype Dx:  Not indicated   Estrogen Receptor:positive >90 % Progesterone Receptor:positive >90 % Her-2-neu Receptor: DCIS, not indicated    Staging  Tumor Size:   DCIS Nodes: clinically/US negative Systemic Metastases: clinically negative Stage: Stage 0 , Tis N0 M0   Treatment  Modality Date    Surgery   04/14/20 Left and partial mastectomy with ultrasound guided needle localization   Radiation  not recommended due to low Prelude   Endocrine  opted against due to minimal benefit after discussion with Dr. Virl Son   Chemotherapy Not indicated Not indicated   Biologic Not indicated Not indicated   Clinical trial         HPI   CC: Follow up left breast cancer    [name] is a 65 y.o. female patient here for follow up of her history of left breast cancer originally diagnosed in July 2021.  She is currently on Serial imaging    The patient is asymptomatic and denies any breast pain , palpable masses, skin or nipple changes or nipple discharge.     Recent Imaging:  None since surgery.     The following portions of the patient's history were reviewed and updated as appropriate: allergies, current medications, past family  history, past medical history, past social history, past surgical history and problem list.     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    Family History     Family History   Problem Relation Age of Onset   . Breast cancer Mother 27        bilateral breast cancer, d. metastatic breast   . Multiple myeloma Mother 34   . Breast cancer Sister 42        DCIS   . Heart failure Brother    . Ovarian cancer Paternal Grandmother 29   . Heart failure Sister    . Bladder Cancer Brother 54   . Leukemia Nephew 7        Brother's son.   . Bone cancer Maternal Grandmother 90        Review of Systems   ROS:  Constitutional:  night sweats  HEENT:  negative  Cardiovascular:  negative  Respiratory:  negative  Gastrointestinal:  negative  Genitourinary:  negative  Musculoskeletal:  negative  Skin:  negative  Neurologic:  negative  Psychiatric:  negative  Endocrine:  negative  Hematologic:  negative  Physical Exam   BP 155/83 (BP Site: Left arm, Patient Position: Sitting)   Pulse 64   Temp 97.6 F (36.4 C) (Temporal)   Ht 1.626 m (5\' 4" )   Wt 97.1 kg (214 lb)   LMP 02/13/2012   BMI 36.73 kg/m     WDWN female in NAD  HEENT:  Clear, no scleral icterus, neck supple  Neck:  No thyromegaly or masses, trachea midline  Chest: Respiratory effort normal  BJM:  Gait and station normal  Ext:  No CCE  Skin:  Free of significant ulcers or lesions  Neuro:  Grossly intact, no focal findings, alert and oriented, asks appropriate questions  LN:  No axillary,  supraclavicular or cervical adenopathy  Breast:  Symmetric .    Right breast: No skin or nipple changes, no nipple retraction or discharge . No palpable masses    Left breast : No skin or nipple changes, no nipple retraction or discharge . No palpable masses. Well healed incision.     Rads   Radiological imaging and reports reviewed as above.     Assessment/Plan   1. Personal history of breast carcinoma    Patient is doing well. She currently has no evidence of disease .  Her physical and emotional recovery is good. Cosmetic outcome is good .  I have recommended continued treatment with Serial imaging    Surveillance / Folllow up recommendations:    After lumpectomy, we recommend mammograms once a year. The purpose is to monitor for local recurrence and detection of new primary.    Patient is advised to report these symptoms : new lumps, bone pain, chest pain, shortness of breath or difficulty breathing, abdominal pain, or persistent headaches.     The following tests are not recommended for routine breast cancer follow-up: breast MRI, FDG-PET scans, complete blood cell counts, automated chemistry studies, chest x-rays, bone scans, liver ultrasound, and tumor markers (CA 15-3, CA 27.29, CEA).     Nutrition  I have also discussed life-style choices to decrease risk of breast cancer such as:   Healthy eating habits, meals high in fruits, vegetables, nuts and complex carbohydrates, low in animal fats.  Limit the amount of alcohol ingested   Avoid  Hormone Replacement Therapy.    Exercise  Continue to exercise, aerobic 30 - 40 minutes , 3 -4 times / week    Follow up  I have provided the patient with the following educational materials:  Other- treatment summary  Left mammogram due now  Bilateral mammogram this summer  Follow up visit with me  in 6 months, sooner if concerns  Follow up with PCP.  Patient agrees with plan.

## 2020-10-23 ENCOUNTER — Encounter (HOSPITAL_BASED_OUTPATIENT_CLINIC_OR_DEPARTMENT_OTHER): Payer: Self-pay | Admitting: Family Nurse Practitioner

## 2020-10-23 ENCOUNTER — Ambulatory Visit (INDEPENDENT_AMBULATORY_CARE_PROVIDER_SITE_OTHER): Payer: Commercial Managed Care - POS | Admitting: Family Nurse Practitioner

## 2020-10-23 VITALS — BP 155/83 | HR 64 | Temp 97.6°F | Ht 64.0 in | Wt 214.0 lb

## 2020-10-23 DIAGNOSIS — Z853 Personal history of malignant neoplasm of breast: Secondary | ICD-10-CM

## 2020-11-23 ENCOUNTER — Other Ambulatory Visit (INDEPENDENT_AMBULATORY_CARE_PROVIDER_SITE_OTHER): Payer: Self-pay | Admitting: Family Nurse Practitioner

## 2020-11-23 ENCOUNTER — Other Ambulatory Visit (HOSPITAL_BASED_OUTPATIENT_CLINIC_OR_DEPARTMENT_OTHER): Payer: Self-pay | Admitting: Family Nurse Practitioner

## 2020-11-23 DIAGNOSIS — Z853 Personal history of malignant neoplasm of breast: Secondary | ICD-10-CM

## 2020-12-05 ENCOUNTER — Other Ambulatory Visit (INDEPENDENT_AMBULATORY_CARE_PROVIDER_SITE_OTHER): Payer: Self-pay | Admitting: Family Nurse Practitioner

## 2020-12-07 ENCOUNTER — Encounter (HOSPITAL_BASED_OUTPATIENT_CLINIC_OR_DEPARTMENT_OTHER): Payer: Self-pay | Admitting: Family Nurse Practitioner

## 2021-02-20 ENCOUNTER — Other Ambulatory Visit: Payer: Self-pay

## 2021-02-21 ENCOUNTER — Encounter: Payer: Self-pay | Admitting: Family Medicine

## 2021-02-21 ENCOUNTER — Ambulatory Visit: Payer: BC Managed Care – PPO | Admitting: Family Medicine

## 2021-02-21 VITALS — BP 120/70 | HR 84 | Resp 16 | Ht 63.0 in | Wt 135.2 lb

## 2021-02-21 DIAGNOSIS — R7303 Prediabetes: Secondary | ICD-10-CM | POA: Diagnosis not present

## 2021-02-21 DIAGNOSIS — E785 Hyperlipidemia, unspecified: Secondary | ICD-10-CM | POA: Diagnosis not present

## 2021-02-21 DIAGNOSIS — R0789 Other chest pain: Secondary | ICD-10-CM

## 2021-02-21 DIAGNOSIS — N644 Mastodynia: Secondary | ICD-10-CM | POA: Diagnosis not present

## 2021-02-21 DIAGNOSIS — E559 Vitamin D deficiency, unspecified: Secondary | ICD-10-CM | POA: Diagnosis not present

## 2021-02-21 DIAGNOSIS — R634 Abnormal weight loss: Secondary | ICD-10-CM

## 2021-02-21 DIAGNOSIS — R109 Unspecified abdominal pain: Secondary | ICD-10-CM

## 2021-02-21 LAB — TSH: TSH: 1.09 u[IU]/mL (ref 0.35–5.50)

## 2021-02-21 LAB — COMPREHENSIVE METABOLIC PANEL
ALT: 31 U/L (ref 0–35)
AST: 16 U/L (ref 0–37)
Albumin: 4.5 g/dL (ref 3.5–5.2)
Alkaline Phosphatase: 59 U/L (ref 39–117)
BUN: 21 mg/dL (ref 6–23)
CO2: 27 mEq/L (ref 19–32)
Calcium: 9.9 mg/dL (ref 8.4–10.5)
Chloride: 103 mEq/L (ref 96–112)
Creatinine, Ser: 0.94 mg/dL (ref 0.40–1.20)
GFR: 63.99 mL/min (ref >60.00)
Glucose, Bld: 94 mg/dL (ref 70–99)
Potassium: 4 mEq/L (ref 3.5–5.1)
Sodium: 141 mEq/L (ref 135–145)
Total Bilirubin: 0.6 mg/dL (ref 0.2–1.2)
Total Protein: 7.3 g/dL (ref 6.0–8.3)

## 2021-02-21 LAB — CBC
HCT: 39.6 % (ref 36.0–46.0)
Hemoglobin: 13.3 g/dL (ref 12.0–15.0)
MCHC: 33.5 g/dL (ref 30.0–36.0)
MCV: 92.5 fl (ref 78.0–100.0)
Platelets: 308 10*3/uL (ref 150.0–400.0)
RBC: 4.28 Mil/uL (ref 3.87–5.11)
RDW: 14.1 % (ref 11.5–15.5)
WBC: 3.6 10*3/uL — ABNORMAL LOW (ref 4.0–10.5)

## 2021-02-21 LAB — VITAMIN D 25 HYDROXY (VIT D DEFICIENCY, FRACTURES): VITD: 12.6 ng/mL — ABNORMAL LOW (ref 30.00–100.00)

## 2021-02-21 LAB — LIPID PANEL
Cholesterol: 410 mg/dL — ABNORMAL HIGH (ref 0–200)
HDL: 61.2 mg/dL (ref >39.00)
LDL Cholesterol: 332 mg/dL — ABNORMAL HIGH (ref 0–99)
NonHDL: 348.8
Total CHOL/HDL Ratio: 7
Triglycerides: 83 mg/dL (ref 0.0–149.0)
VLDL: 16.6 mg/dL (ref 0.0–40.0)

## 2021-02-21 LAB — HEMOGLOBIN A1C: Hgb A1c MFr Bld: 6 % (ref 4.6–6.5)

## 2021-02-21 NOTE — Progress Notes (Signed)
Chief Complaint  Patient presents with   losing weight   Breast Pain    She has appt with her gyn today.   Abdominal Pain   HPI: [name] is a 65 y.o. female, who is here today with above complaints.  She was last seen on 11/17/19 for tick bite. She is concerned about unintentional wt loss.  Noted 2 weeks ago, a months ago at work she was 153 Lb,same scale yesterday 139 Lb, 141 Lb Monday. She has been weighing on sister's scale and work. She is losing wt around her buttocks and breast. She is back to smaller size clothes, same wt she was before COVID 19 pandemia. She was working from home until 12/2020. Her wt was 145 Lb last visit.  Negative for fever but she has had some chills.  Until a month ago she was snacking on fruit (bananas and grapes) and cheese. Appetite has decreased. Her dogs have been sick, so she has been cleaning more and she is walking them around her back yard (an acre) x 3 during lunch.  A month ago, her son moved with her and brought a new wife from [place]. Her daughter in law is cooking and she does not like it.  2-3 weeks ago of lower abdominal pain (RLQ and suprapubic) exacerbated by food/drink intake . Cramp that last a few seconds. It is not radiated. Negative for changes in bowel movements, she has one daily, last one today.Hard stools, "small and big balls", no blood or mucus. She does not feel like she is emptying. No urinary symptoms. She does not think she is drinking enough fluids. She has not tried OTC medications. Nausea once. Concerned about possible liver disease. Negative for high alcohol intake.  Reporting that 4 months ago she had diarrhea and one episode of stool incontinence. Colonoscopy in 04/2012, 10 years f/u was recommended.  Lab Results  Component Value Date   WBC 5.8 03/08/2017   HGB 12.4 03/08/2017   HCT 36.7 03/08/2017   MCV 91.5 03/08/2017   PLT 282 03/08/2017   Lab Results  Component Value Date    CREATININE 0.85 04/07/2019   BUN 10 04/07/2019   NA 138 04/07/2019   K 4.4 04/07/2019   CL 99 04/07/2019   CO2 30 04/07/2019   Prediabetes:Negative for polydipsia,polyuria, or polyphagia.  Lab Results  Component Value Date   HGBA1C 6.1 04/07/2019   Lab Results  Component Value Date   TSH 0.97 09/19/2016   She has an appt with her gyn today to address breast tenderness. Right-sided chest pain, she can pinpoint. She "always" has had this pain, dull, constant. She has not identified exacerbating or alleviating factors. She touches area frequently trying to find areas of pain.  Negative for exertional CP,palpitations, SOB,or diaphoresis. No hx of trauma.  Vit D deficiency:She is on OTC vit D supplementation. Last 25 OH vit D 11.3 in 03/2019.  HLD: She is on non pharmacologic treatment. She did not try Lovastatin as recommended.  Component     Latest Ref Rng & Units 04/07/2019  Cholesterol     0 - 200 mg/dL 222 (H)  Triglycerides     0.0 - 149.0 mg/dL 85.0  HDL Cholesterol     >39.00 mg/dL 71.30  VLDL     0.0 - 40.0 mg/dL 17.0  LDL (calc)     0 - 99 mg/dL 134 (H)  Total CHOL/HDL Ratio      3  NonHDL  150.77   Review of Systems  Constitutional:  Positive for activity change and appetite change. Negative for fatigue.  HENT:  Negative for mouth sores, nosebleeds, sore throat and trouble swallowing.   Eyes:  Negative for redness and visual disturbance.  Respiratory:  Negative for cough and wheezing.   Cardiovascular:  Negative for leg swelling.  Gastrointestinal:  Negative for abdominal distention and vomiting.  Endocrine: Negative for cold intolerance, heat intolerance, polydipsia, polyphagia and polyuria.  Genitourinary:  Negative for decreased urine volume, dysuria and hematuria.  Musculoskeletal:  Negative for gait problem and myalgias.  Skin:  Negative for pallor and rash.  Neurological:  Negative for syncope, weakness and headaches.  Hematological:  Negative  for adenopathy. Does not bruise/bleed easily.  Psychiatric/Behavioral:  Negative for confusion. The patient is nervous/anxious.   Rest see pertinent positives and negatives per HPI.  Current Outpatient Medications on File Prior to Visit  Medication Sig Dispense Refill   VITAMIN D PO Take by mouth.     No current facility-administered medications on file prior to visit.   Past Medical History:  Diagnosis Date   Clotting disorder ([place]) 2000   lower leg from injury   History of tobacco abuse    Hyperlipidemia    Osteopenia    Allergies  Allergen Reactions   Sulfa Antibiotics Itching   Sulfonamide Derivatives     REACTION: Hives   Penicillins Rash   Social History   Socioeconomic History   Marital status: Divorced    Spouse name: Not on file   Number of children: Not on file   Years of education: Not on file   Highest education level: Not on file  Occupational History   Not on file  Tobacco Use   Smoking status: Former   Smokeless tobacco: Never  Substance and Sexual Activity   Alcohol use: No    Alcohol/week: 0.0 standard drinks   Drug use: No   Sexual activity: Yes    Birth control/protection: Surgical  Other Topics Concern   Not on file  Social History Narrative   Former Smoker   Alcohol use-no     Occupation:  [employer]     3 daughters   1 son   Divorced  (husband had drug problem)    Social Determinants of Health   Financial Resource Strain: Not on file  Food Insecurity: Not on file  Transportation Needs: Not on file  Physical Activity: Not on file  Stress: Not on file  Social Connections: Not on file   Vitals:   02/21/21 0701  BP: 120/70  Pulse: 84  Resp: 16  SpO2: 98%   Wt Readings from Last 3 Encounters:  02/21/21 135 lb 4 oz (61.3 kg)  11/17/19 145 lb 8 oz (66 kg)  04/07/19 150 lb 6 oz (68.2 kg)   Body mass index is 23.96 kg/m.  Physical Exam Vitals and nursing note reviewed.  Constitutional:      General: She is not in acute  distress.    Appearance: She is well-developed.  HENT:     Head: Normocephalic and atraumatic.     Mouth/Throat:     Mouth: Mucous membranes are moist.     Pharynx: Oropharynx is clear.  Eyes:     Conjunctiva/sclera: Conjunctivae normal.  Neck:     Thyroid: No thyroid mass or thyroid tenderness.  Cardiovascular:     Rate and Rhythm: Normal rate and regular rhythm.     Pulses:  Dorsalis pedis pulses are 2+ on the right side and 2+ on the left side.     Heart sounds: No murmur heard. Pulmonary:     Effort: Pulmonary effort is normal. No respiratory distress.     Breath sounds: Normal breath sounds.  Chest:     Chest wall: No deformity or tenderness.    Abdominal:     Palpations: Abdomen is soft. There is no hepatomegaly or mass.     Tenderness: There is no abdominal tenderness.  Lymphadenopathy:     Cervical: No cervical adenopathy.  Skin:    General: Skin is warm.     Findings: No erythema or rash.  Neurological:     General: No focal deficit present.     Mental Status: She is alert and oriented to person, place, and time.     Cranial Nerves: No cranial nerve deficit.     Gait: Gait normal.  Psychiatric:     Comments: Well groomed, good eye contact.   ASSESSMENT AND PLAN:  [name] was seen today for losing weight, breast pain and abdominal pain.  Diagnoses and all orders for this visit: Orders Placed This Encounter  Procedures   Comprehensive metabolic panel   Lipid panel   VITAMIN D 25 Hydroxy (Vit-D Deficiency, Fractures)   TSH   Hemoglobin A1c   CBC   Lab Results  Component Value Date   HGBA1C 6.0 02/21/2021   Lab Results  Component Value Date   WBC 3.6 (L) 02/21/2021   HGB 13.3 02/21/2021   HCT 39.6 02/21/2021   MCV 92.5 02/21/2021   PLT 308.0 02/21/2021   Lab Results  Component Value Date   CREATININE 0.94 02/21/2021   BUN 21 02/21/2021   NA 141 02/21/2021   K 4.0 02/21/2021   CL 103 02/21/2021   CO2 27 02/21/2021   Lab Results   Component Value Date   ALT 31 02/21/2021   AST 16 02/21/2021   ALKPHOS 59 02/21/2021   BILITOT 0.6 02/21/2021   Lab Results  Component Value Date   CHOL 410 (H) 02/21/2021   HDL 61.20 02/21/2021   LDLCALC 332 (H) 02/21/2021   LDLDIRECT 224.3 06/08/2007   TRIG 83.0 02/21/2021   CHOLHDL 7 02/21/2021   Weight loss, non-intentional 10 Lb lost sine her last visit, 11/2019. Based on information provided today it seems like she has decreased calorie intake (fruit and cheese) and she is more active, walking her dogs daily during lunch; so these changes could be contributing to her wt loss. Other possible etiologies discussed. Recommend weighing in the same scale at home in the morning weekly. Further recommendations according to lab results.  Prediabetes Continue a healthy life style for diabetes prevention.  Vitamin D deficiency Continue same dose of vit D supplementation. Further recommendations according to 25 OH vit D results.  Hyperlipidemia, unspecified hyperlipidemia type She is not on pharmacologic treatment. Low fat diet to continue. Further recommendations will be given according to 10 years CVD risk score and lipid panel numbers.  Chest wall pain Chronic. Musculoskeletal pain, reassured. Avoid pressing on chest wall. Monitor for new symptoms.  Abdominal pain, unspecified abdominal location Examination today and hx do not suggest a serious process. Could be caused by constipation. Increased fiber and fluid intake. OTC Miralax daily as needed may help.  I spent a total of 45 minutes in both face to face and non face to face activities for this visit on the date of this encounter. During this time history  was obtained and documented, examination was performed, prior labs reviewed, and assessment/plan discussed.  Return if symptoms worsen or fail to improve, for depending of lab results..   Sajid Ruppert G. [place], MD  [facility]. [place]  office.

## 2021-02-21 NOTE — Patient Instructions (Addendum)
A few things to remember from today's visit:   Weight loss, non-intentional - Plan: TSH, CBC  Prediabetes - Plan: Hemoglobin A1c  Vitamin D deficiency - Plan: VITAMIN D 25 Hydroxy (Vit-D Deficiency, Fractures)  Hyperlipidemia, unspecified hyperlipidemia type - Plan: Comprehensive metabolic panel, Lipid panel  If you need refills please call your pharmacy. Do not use My Chart to request refills or for acute issues that need immediate attention.   Continue monitoring wt at home, morning. Keep appt with gyn. I think wt loss is from increasing physical activity and decreasing calorie intake.  Please be sure medication list is accurate. If a new problem present, please set up appointment sooner than planned today.

## 2021-02-24 MED ORDER — ATORVASTATIN CALCIUM 40 MG PO TABS: 40.0000 mg | ORAL_TABLET | Freq: Every day | ORAL | 3 refills | Status: DC

## 2021-02-24 MED ORDER — VITAMIN D (ERGOCALCIFEROL) 1.25 MG (50000 UNIT) PO CAPS: ORAL_CAPSULE | ORAL | 0 refills | Status: DC

## 2021-03-13 DIAGNOSIS — N644 Mastodynia: Secondary | ICD-10-CM | POA: Diagnosis not present

## 2021-03-13 LAB — HM MAMMOGRAPHY

## 2021-03-16 ENCOUNTER — Encounter: Payer: Self-pay | Admitting: Family Medicine

## 2021-04-26 ENCOUNTER — Ambulatory Visit (HOSPITAL_BASED_OUTPATIENT_CLINIC_OR_DEPARTMENT_OTHER): Payer: Commercial Managed Care - POS | Admitting: Surgery

## 2021-05-15 ENCOUNTER — Encounter (HOSPITAL_BASED_OUTPATIENT_CLINIC_OR_DEPARTMENT_OTHER): Payer: Self-pay | Admitting: Family Nurse Practitioner

## 2021-05-15 NOTE — Progress Notes (Signed)
[facility] Breast Cancer Follow Up    Treatment Team  Ref Prov: Terrilee Files, MD Primary Care Physician :   Terrilee Files, MD Radiology facility: [facility])--[phone] Breast Surgeon : Henderson Baltimore MD  [phone]   Plastic Surgeon none Radiation Onc:  Miguel Dibble MD:  [id number] Morene Antu Heber); Medical Onc:  Dr. Burt Knack        Breast Cancer Comprehensive Care Plan Summary  Date of diagnosis: 01/25/2020 Event : DCIS ECOG Performance: Grade 0  (Fully active) Genetic Testing : negative   Presentation : Abnormal mammogram with microcalcifications Side: left Focality :  unifocal Location: lateral region   Biologic Tumor characteristics  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  Ki-67:  DCIS - not indicated Oncotype Dx:  Not indicated   Estrogen Receptor: positive >90 % Progesterone Receptor: positive >90 % Her-2-neu Receptor: DCIS, not indicated     Staging  Tumor Size:   DCIS Nodes: clinically/US negative Systemic Metastases: clinically negative Stage: Stage 0 , Tis N0 M0   Treatment  Modality Date     Surgery    04/14/20 Left and partial mastectomy with ultrasound guided needle localization   Radiation    not recommended due to low Prelude   Endocrine    opted against due to minimal benefit after discussion with Dr. Virl Son   Chemotherapy Not indicated Not indicated   Biologic Not indicated Not indicated   Clinical trial            HPI   CC: Follow up left breast cancer    [name] [name] is a 65 y.o. female patient here for follow up of her history of left breast cancer originally diagnosed in July 2021.  She is currently on Serial imaging    The patient is asymptomatic and denies any breast pain , palpable masses, skin or nipple changes or nipple discharge.     Recent Imaging:  BDM 11/23/20: Cluster of heterogeneous microcalcifications in the central left breast  in patient status post left mastectomy. Recommend stereotactic biopsy  for  further evaluation. BIRADS4    Left stereo bx 12/05/20:  Hyalinized fibroadenoma with  calcifications.    The following portions of the patient's history were reviewed and updated as appropriate: allergies, current medications, past family history, past medical history, past social history, past surgical history and problem list.     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    Family History     Family History   Problem Relation Age of Onset    Breast cancer Mother 33        bilateral breast cancer, d. metastatic breast    Multiple myeloma Mother 73    Breast cancer Sister 66        DCIS    Heart failure Brother     Ovarian cancer Paternal Grandmother 26    Heart failure Sister     Bladder Cancer Brother 16    Leukemia Nephew 7        Brother's son.    Bone cancer Maternal Grandmother 90        Review  of Systems   ROS:  Constitutional:  night sweats  HEENT:  negative  Cardiovascular:  negative  Respiratory:  negative  Gastrointestinal:  negative  Genitourinary:  negative  Musculoskeletal:  negative  Skin:  negative  Neurologic:  negative  Psychiatric:  negative  Endocrine:  negative  Hematologic:  negative                     Physical Exam   BP 145/78 (BP Site: Right arm, Patient Position: Sitting)   Pulse 71   Temp 97.3 F (36.3 C) (Temporal)   Resp 16   Ht 1.626 m (5\' 4" )   Wt 93 kg (205 lb)   LMP 02/13/2012   SpO2 99%   BMI 35.19 kg/m     WDWN female in NAD  HEENT:  Clear, no scleral icterus, neck supple  Neck:  No thyromegaly or masses, trachea  midline  Chest: Respiratory effort normal  BJM:  Gait and station normal  Ext:  No CCE  Skin:  Free of significant ulcers or lesions  Neuro:  Grossly intact, no focal findings, alert and oriented, asks appropriate questions  LN:  No axillary, supraclavicular or cervical adenopathy  Breast:  Symmetric .    Right breast: No skin or nipple changes, no nipple retraction or discharge . No palpable masses    Left breast : No skin or nipple changes, no nipple retraction or discharge . No palpable masses. Well healed incision.     Rads   Radiological imaging and reports reviewed as above.     Assessment/Plan   Personal history of breast carcinoma    Patient is doing well. She currently has no evidence of disease .  Her physical and emotional recovery is good. Cosmetic outcome is good .  I have recommended continued treatment with Serial imaging    Surveillance / Folllow up recommendations:    After lumpectomy, we recommend mammograms once a year. The purpose is to monitor for local recurrence and detection of new primary.    Patient is advised to report these symptoms : new lumps, bone pain, chest pain, shortness of breath or difficulty breathing, abdominal pain, or persistent headaches.     The following tests are not recommended for routine breast cancer follow-up: breast MRI, FDG-PET scans, complete blood cell counts, automated chemistry studies, chest x-rays, bone scans, liver ultrasound, and tumor markers (CA 15-3, CA 27.29, CEA).     Nutrition  I have also discussed life-style choices to decrease risk of breast cancer such as:   Healthy eating habits, meals high in fruits, vegetables, nuts and complex carbohydrates, low in animal fats.  Limit the amount of alcohol ingested   Avoid  Hormone Replacement Therapy.    Exercise  Continue to exercise, aerobic 30 - 40 minutes , 3 -4 times / week    Follow up  Left mammogram now  Bilateral mammogram in May 2023.   Follow up visit with me  in 6 months, sooner if  concerns  Follow up with PCP.  Patient agrees with plan.

## 2021-05-16 DIAGNOSIS — Z1231 Encounter for screening mammogram for malignant neoplasm of breast: Secondary | ICD-10-CM | POA: Diagnosis not present

## 2021-05-16 DIAGNOSIS — S92501A Displaced unspecified fracture of right lesser toe(s), initial encounter for closed fracture: Secondary | ICD-10-CM | POA: Diagnosis not present

## 2021-05-16 LAB — HM MAMMOGRAPHY

## 2021-05-17 ENCOUNTER — Encounter (HOSPITAL_BASED_OUTPATIENT_CLINIC_OR_DEPARTMENT_OTHER): Payer: Self-pay | Admitting: Family Nurse Practitioner

## 2021-05-17 ENCOUNTER — Ambulatory Visit (HOSPITAL_BASED_OUTPATIENT_CLINIC_OR_DEPARTMENT_OTHER): Payer: No Typology Code available for payment source | Admitting: Family Nurse Practitioner

## 2021-05-17 VITALS — BP 145/78 | HR 71 | Temp 97.3°F | Resp 16 | Ht 64.0 in | Wt 205.0 lb

## 2021-05-17 DIAGNOSIS — Z853 Personal history of malignant neoplasm of breast: Secondary | ICD-10-CM

## 2021-05-17 DIAGNOSIS — R928 Other abnormal and inconclusive findings on diagnostic imaging of breast: Secondary | ICD-10-CM

## 2021-05-18 ENCOUNTER — Encounter: Payer: Self-pay | Admitting: Family Medicine

## 2021-05-23 DIAGNOSIS — S92501D Displaced unspecified fracture of right lesser toe(s), subsequent encounter for fracture with routine healing: Secondary | ICD-10-CM | POA: Diagnosis not present

## 2021-06-13 DIAGNOSIS — S92501D Displaced unspecified fracture of right lesser toe(s), subsequent encounter for fracture with routine healing: Secondary | ICD-10-CM | POA: Diagnosis not present

## 2021-07-11 DIAGNOSIS — S92501D Displaced unspecified fracture of right lesser toe(s), subsequent encounter for fracture with routine healing: Secondary | ICD-10-CM | POA: Diagnosis not present

## 2021-07-24 ENCOUNTER — Other Ambulatory Visit (INDEPENDENT_AMBULATORY_CARE_PROVIDER_SITE_OTHER): Payer: Self-pay | Admitting: Family Nurse Practitioner

## 2021-07-30 DIAGNOSIS — Z01419 Encounter for gynecological examination (general) (routine) without abnormal findings: Secondary | ICD-10-CM | POA: Diagnosis not present

## 2021-07-30 DIAGNOSIS — Z6822 Body mass index (BMI) 22.0-22.9, adult: Secondary | ICD-10-CM | POA: Diagnosis not present

## 2021-07-30 DIAGNOSIS — M816 Localized osteoporosis [Lequesne]: Secondary | ICD-10-CM | POA: Diagnosis not present

## 2021-07-30 DIAGNOSIS — N958 Other specified menopausal and perimenopausal disorders: Secondary | ICD-10-CM | POA: Diagnosis not present

## 2021-09-20 DIAGNOSIS — M810 Age-related osteoporosis without current pathological fracture: Secondary | ICD-10-CM | POA: Diagnosis not present

## 2021-10-25 DIAGNOSIS — E559 Vitamin D deficiency, unspecified: Secondary | ICD-10-CM | POA: Diagnosis not present

## 2021-10-25 DIAGNOSIS — M810 Age-related osteoporosis without current pathological fracture: Secondary | ICD-10-CM | POA: Diagnosis not present

## 2021-10-31 NOTE — Progress Notes (Signed)
? ? ?ACUTE VISIT ?Chief Complaint  ?Patient presents with  ? tick bite  ?  Happened on Monday, on her foot. Now has a red bump.  ? ?HPI: ?[name] is a 66 y.o. female, who is here today complaining of pruritis lesions on right foot noted after removing a "tiny" tick 5 days ago. ?She is not sure for how long tick was on, it was not engorged or embedded. Her daughter removed the while tick ?Lesion has not changed and symptoms are stable. ?Problem is constant. ?She has not had similar rash before. ? ?She applied topical alcohol. ?Negative for fever,chills, arthralgias,or joint edema/erythema.. ? ?Review of Systems  ?Constitutional:  Negative for activity change and chills.  ?HENT:  Negative for mouth sores, nosebleeds and sore throat.   ?Respiratory:  Negative for cough, shortness of breath and wheezing.   ?Gastrointestinal:  Negative for abdominal pain, nausea and vomiting.  ?Skin:  Negative for wound.  ?Neurological:  Negative for weakness and numbness.  ?Rest see pertinent positives and negatives per HPI. ? ?Current Outpatient Medications on File Prior to Visit  ?Medication Sig Dispense Refill  ? atorvastatin (LIPITOR) 40 MG tablet Take 1 tablet (40 mg total) by mouth daily. 90 tablet 3  ? VITAMIN D PO Take by mouth.    ? Vitamin D, Ergocalciferol, (DRISDOL) 1.25 MG (50000 UNIT) CAPS capsule 1 cap weekly for 8 weeks then every 2 weeks. 12 capsule 0  ? ?No current facility-administered medications on file prior to visit.  ? ?Past Medical History:  ?Diagnosis Date  ? Clotting disorder ([place]) 2000  ? lower leg from injury  ? History of tobacco abuse   ? Hyperlipidemia   ? Osteopenia   ? ?Allergies  ?Allergen Reactions  ? Sulfa Antibiotics Itching  ? Sulfonamide Derivatives   ?  REACTION: Hives  ? Penicillins Rash  ? ? ?Social History  ? ?Socioeconomic History  ? Marital status: Divorced  ?  Spouse name: Not on file  ? Number of children: Not on file  ? Years of education: Not on file  ? Highest education  level: Not on file  ?Occupational History  ? Not on file  ?Tobacco Use  ? Smoking status: Former  ? Smokeless tobacco: Never  ?Substance and Sexual Activity  ? Alcohol use: No  ?  Alcohol/week: 0.0 standard drinks  ? Drug use: No  ? Sexual activity: Yes  ?  Birth control/protection: Surgical  ?Other Topics Concern  ? Not on file  ?Social History Narrative  ? Former Smoker  ? Alcohol use-no    ? Occupation:  [employer]    ? 3 daughters  ? 1 son  ? Divorced  (husband had drug problem)   ? ?Social Determinants of Health  ? ?Financial Resource Strain: Not on file  ?Food Insecurity: Not on file  ?Transportation Needs: Not on file  ?Physical Activity: Not on file  ?Stress: Not on file  ?Social Connections: Not on file  ? ?Vitals:  ? 11/02/21 1239  ?BP: 120/80  ?Pulse: 71  ?Resp: 16  ?SpO2: 99%  ? ?Wt Readings from Last 3 Encounters:  ?11/02/21 130 lb 2 oz (59 kg)  ?02/21/21 135 lb 4 oz (61.3 kg)  ?11/17/19 145 lb 8 oz (66 kg)  ? ?Body mass index is 23.05 kg/m?. ? ?Physical Exam ?Vitals and nursing note reviewed.  ?Constitutional:   ?   General: She is not in acute distress. ?   Appearance: She is well-developed.  ?  HENT:  ?   Head: Normocephalic and atraumatic.  ?Eyes:  ?   Conjunctiva/sclera: Conjunctivae normal.  ?Cardiovascular:  ?   Rate and Rhythm: Normal rate and regular rhythm.  ?   Pulses:     ?     Dorsalis pedis pulses are 2+ on the right side.  ?Pulmonary:  ?   Effort: Pulmonary effort is normal. No respiratory distress.  ?   Breath sounds: Normal breath sounds.  ?Lymphadenopathy:  ?   Cervical: No cervical adenopathy.  ?Skin: ?   General: Skin is warm.  ?   Findings: Rash present. No erythema.  ?   Comments: On right foot 2 papular not erythematous lesions. There is no tenderness or induration. ?See picture.  ?Neurological:  ?   Mental Status: She is alert and oriented to person, place, and time.  ?Psychiatric:     ?   Speech: Speech normal.  ?   Comments: Well groomed, good eye contact.  ? ? ?ASSESSMENT  AND PLAN: ? ?Ms.[name] was seen today for tick bite. ? ?Diagnoses and all orders for this visit: ? ?Tick bite of right foot, initial encounter ?We discussed criteria for abx prophylaxis against Lyme disease, based on hx it does not seem like there is a risk.  ?Recommend single dose of Doxycycline 200 mg. ?Monitor for new symptoms. ?I do not think blood work is needed today. ? ?-     doxycycline (VIBRA-TABS) 100 MG tablet; Take 2 tablets (200 mg total) by mouth once for 1 dose. ? ?Localized skin eruption ?She has 2 papular lesions, not the typical presentation for tick bites. ?Recommend topical steroid, small amount bid x 14 days. ?Monitor for worsening rash or new lesions. ? ?-     triamcinolone cream (KENALOG) 0.1 %; Apply 1 application. topically 2 (two) times daily for 14 days. ? ?Return if symptoms worsen or fail to improve. ? ?Timoth Schara G. [place], MD ? ?[place]. ?[place] office. ? ? ?

## 2021-11-02 ENCOUNTER — Ambulatory Visit (INDEPENDENT_AMBULATORY_CARE_PROVIDER_SITE_OTHER): Payer: Self-pay | Admitting: Family Medicine

## 2021-11-02 ENCOUNTER — Encounter: Payer: Self-pay | Admitting: Family Medicine

## 2021-11-02 VITALS — BP 120/80 | HR 71 | Resp 16 | Ht 63.0 in | Wt 130.1 lb

## 2021-11-02 DIAGNOSIS — W57XXXA Bitten or stung by nonvenomous insect and other nonvenomous arthropods, initial encounter: Secondary | ICD-10-CM

## 2021-11-02 DIAGNOSIS — R21 Rash and other nonspecific skin eruption: Secondary | ICD-10-CM

## 2021-11-02 DIAGNOSIS — S90861A Insect bite (nonvenomous), right foot, initial encounter: Secondary | ICD-10-CM

## 2021-11-02 MED ORDER — DOXYCYCLINE HYCLATE 100 MG PO TABS: 200.0000 mg | ORAL_TABLET | Freq: Once | ORAL | 0 refills | Status: AC

## 2021-11-02 MED ORDER — TRIAMCINOLONE ACETONIDE 0.1 % EX CREA: 1.0000 "application " | TOPICAL_CREAM | Freq: Two times a day (BID) | CUTANEOUS | 0 refills | Status: AC

## 2021-11-02 NOTE — Patient Instructions (Addendum)
A few things to remember from today's visit: ? ?Tick bite of right foot, initial encounter - Plan: doxycycline (VIBRA-TABS) 100 MG tablet, triamcinolone cream (KENALOG) 0.1 % ? ?If you need refills please call your pharmacy. ?Do not use My Chart to request refills or for acute issues that need immediate attention. ?  ?2 tab of antibiotic once. ?Monitor for charges. ? ?Please be sure medication list is accurate. ?If a new problem present, please set up appointment sooner than planned today. ?Tick Bite Information, Adult ?Ticks are insects that draw blood for food. Most ticks live in shrubs and grassy and wooded areas. They climb onto people and animals that brush against the leaves and grasses that they rest on. Then they bite, attaching themselves to the skin. Most ticks are harmless, but some ticks may carry germs that can spread to a person through a bite and cause a disease. To reduce your risk of getting a disease from a tick bite, make sure you: ?Take steps to prevent tick bites. ?Check for ticks after being outdoors where ticks live. ?Watch for symptoms of disease if a tick attached to you or if you suspect a tick bite. ?How can I prevent tick bites? ?Take these steps to help prevent tick bites when you go outdoors in an area where ticks live: ?Use insect repellent ?Use insect repellent that has DEET (20% or higher), picaridin, or IR3535 in it. Follow the instructions on the label. Use these products on: ?Bare skin. ?The top of your boots. ?Your pant legs. ?Your sleeve cuffs. ?For insect repellent that contains permethrin, follow the instructions on the label. Use these products on: ?Clothing. ?Boots. ?Outdoor gear. ?Tents. ?When you are outside ?Wear protective clothing. Long sleeves and long pants offer the best protection from ticks. ?Wear light-colored clothing so you can see ticks more easily. ?Tuck your pant legs into your socks. ?If you go walking on a trail, stay in the middle of the trail so your  skin, hair, and clothing do not touch the bushes. ?Avoid walking through areas with long grass. ?Check for ticks on your clothing, hair, and skin often while you are outside, and check again before you go inside. Make sure to check the scalp, neck, armpits, waist, groin, and joint areas. These are the spots where ticks attach themselves most often. ?When you go indoors ?Check your clothing for ticks. Tumble dry clothes in a dryer on high heat for at least 10 minutes. If clothes are damp, additional time may be needed. If clothes require washing, use hot water. ?Examine gear and pets. ?Shower soon after being outdoors. ?Check your body for ticks. Conduct a full body check using a mirror. ?What is the proper way to remove a tick? ?If you find a tick on your body, remove it as soon as possible. Removing a tick sooner can prevent germs from passing to your body. Do not remove the tick with your bare fingers. To remove a tick that is crawling on your skin but has not bitten, use either of these methods: ?Go outdoors and brush the tick off. ?Remove the tick with tape or a lint roller. ?To remove a tick that is attached to your skin: ?Wash your hands. If you have latex gloves, put them on. ?Use fine-tipped tweezers, curved forceps, or a tick-removal tool to gently grasp the tick as close to your skin and the tick's head as possible. ?Gently pull with a steady, upward, even pressure until the tick lets go. ?When removing  the tick: ?Take care to keep the tick's head attached to its body. ?Do not twist or jerk the tick. This can make the tick's head or mouth parts break off and remain in the skin. ?Do not squeeze or crush the tick's body. This could force disease-carrying fluids from the tick into your body. ?Do not try to remove a tick with heat, alcohol, petroleum jelly, or fingernail polish. Using these methods can cause the tick to salivate and regurgitate into your bloodstream, increasing your risk of getting a  disease. ?What should I do after removing a tick? ?Dispose of the tick. Do not crush a tick with your fingers. ?Clean the bite area and your hands with soap and water, rubbing alcohol, or an iodine scrub. ?If an antiseptic cream or ointment is available, apply a small amount to the bite site. ?Wash and disinfect any instruments that you used to remove the tick. ?How should I dispose of a tick? ?To dispose of a live tick, use one of these methods: ?Place it in rubbing alcohol. ?Place it in a sealed bag or container. ?Wrap it tightly in tape. ?Flush it down the toilet. ?Contact a health care provider if: ?You have symptoms of a disease after a tick bite. Symptoms of a tick-borne disease can occur from moments after the tick bites to 30 days after a tick is removed. Symptoms include: ?Fever or chills. ?Any of these signs in the bite area: ?A red rash that makes a circle (bull's-eye rash) in the bite area. ?Redness and swelling. ?Headache. ?Muscle, joint, or bone pain. ?Abnormal tiredness. ?Numbness in your legs or difficulty walking or moving your legs. ?Tender, swollen lymph glands. ?A part of a tick breaks off and gets stuck in your skin. ?Get help right away if: ?You are not able to remove a tick. ?You experience muscle weakness or paralysis. ?Your symptoms get worse or you experience new symptoms. ?You find an engorged tick on your skin and you are in an area where disease from ticks is a high risk. ?Summary ?Ticks may carry germs that can spread to a person through a bite and cause a disease. ?Wear protective clothing and use insect repellent to prevent tick bites. Follow the instructions on the label. ?If you find a tick on your body, remove it as soon as possible. If the tick is attached, do not try to remove with heat, alcohol, petroleum jelly, or fingernail polish. ?Remove the attached tick using fine-tipped tweezers, curved forceps, or a tick-removal tool. Gently pull with steady, upward, even pressure until  the tick lets go. Do not twist or jerk the tick. Do not squeeze or crush the tick's body. ?If you have symptoms of a disease after being bitten by a tick, contact a health care provider. ?This information is not intended to replace advice given to you by your health care provider. Make sure you discuss any questions you have with your health care provider. ?Document Revised: 06/28/2019 Document Reviewed: 06/28/2019 ?Elsevier Patient Education ? [place]. ? ? ? ? ? ? ? ?

## 2021-11-13 ENCOUNTER — Other Ambulatory Visit: Payer: Self-pay | Admitting: Family Medicine

## 2021-11-13 DIAGNOSIS — E559 Vitamin D deficiency, unspecified: Secondary | ICD-10-CM

## 2021-11-24 ENCOUNTER — Encounter (HOSPITAL_COMMUNITY): Payer: Self-pay | Admitting: *Deleted

## 2021-11-24 ENCOUNTER — Ambulatory Visit (HOSPITAL_COMMUNITY)
Admission: EM | Admit: 2021-11-24 | Discharge: 2021-11-24 | Disposition: A | Discharge status: Home / Self Care | Payer: Medicare Other | Attending: Emergency Medicine | Admitting: Emergency Medicine

## 2021-11-24 DIAGNOSIS — S90861A Insect bite (nonvenomous), right foot, initial encounter: Secondary | ICD-10-CM | POA: Diagnosis not present

## 2021-11-24 DIAGNOSIS — R519 Headache, unspecified: Secondary | ICD-10-CM | POA: Diagnosis not present

## 2021-11-24 DIAGNOSIS — W57XXXA Bitten or stung by nonvenomous insect and other nonvenomous arthropods, initial encounter: Secondary | ICD-10-CM | POA: Diagnosis not present

## 2021-11-24 DIAGNOSIS — Z79899 Other long term (current) drug therapy: Secondary | ICD-10-CM | POA: Diagnosis not present

## 2021-11-24 DIAGNOSIS — J0100 Acute maxillary sinusitis, unspecified: Secondary | ICD-10-CM | POA: Diagnosis not present

## 2021-11-24 DIAGNOSIS — Z20822 Contact with and (suspected) exposure to covid-19: Secondary | ICD-10-CM | POA: Diagnosis not present

## 2021-11-24 NOTE — ED Triage Notes (Signed)
Pt reports tick bite to right foot approx 1 month ago; states saw PCP - started an abx 1 wk following removal of tick by family member.  ?C/O right-sided facial pain, jaw, pain, ear pain, "gum pain" with swelling x approx 4 days. Pt has appt with dentist, but wants to ensure it isn't related to tick bite. No known fevers. No obvious rash. ?

## 2021-11-24 NOTE — ED Provider Notes (Signed)
?[facility] ? ? ? ?CSN: [id number] ?Arrival date & time: 11/24/21  1007 ? ? ?  ? ?History   ?Chief Complaint ?Chief Complaint  ?Patient presents with  ? Tick Bite  ? Facial Pain  ? ? ?HPI ?Leslie Boyer is a 66 y.o. female.  ? ?Patient presents with right-sided facial pain, mild sore throat, right-sided ear pain, nasal congestion, rhinorrhea and right-sided lymph node swelling for 4 days.  Tolerating food and liquids.  No known sick contacts.  Has not attempted treatment of symptoms.  Has upcoming dental appointment for evaluation of partial lower denture plate, endorses that she is lost weight over the last year and it is not fitting appropriately.  Denies seasonal allergies.  Denies fever, chills, body aches cough, shortness of breath, wheezing, headache.  Patient endorses that she was bit by a tick 1 month ago, took 200 mg of doxycycline once prophylactically and has been using triamcinolone cream over the affected area.  Endorses area has scabbed but still has a uncomfortable sensation at times.  Unsure if the above symptoms are all related. ? ? ?Past Medical History:  ?Diagnosis Date  ? Clotting disorder ([place]) 2000  ? lower leg from injury  ? History of tobacco abuse   ? Hyperlipidemia   ? Osteopenia   ? ? ?Patient Active Problem List  ? Diagnosis Date Noted  ? Chronic fatigue, unspecified 03/21/2017  ? Bilateral leg cramps 09/18/2016  ? Varicose veins 02/07/2014  ? Dyspepsia 07/27/2012  ? ARM PAIN, RIGHT 02/09/2009  ? Vitamin D deficiency 11/17/2008  ? Cholelithiasis without cholecystitis 11/17/2008  ? OVARIAN CYST, LEFT 11/17/2008  ? ANEMIA 06/12/2007  ? TOBACCO ABUSE 06/12/2007  ? Hyperlipidemia 06/08/2007  ? ? ?Past Surgical History:  ?Procedure Laterality Date  ? MULTIPLE TOOTH EXTRACTIONS  11/2011  ? TOTAL ABDOMINAL HYSTERECTOMY    ? ? ?OB History   ?No obstetric history on file. ?  ? ? ? ?Home Medications   ? ?Prior to Admission medications   ?Medication Sig Start Date End Date Taking?  Authorizing Provider  ?atorvastatin (LIPITOR) 40 MG tablet Take 1 tablet (40 mg total) by mouth daily. 02/24/21   Martinique, Betty G, MD  ?VITAMIN D PO Take by mouth.    [provider]  ?Vitamin D, Ergocalciferol, (DRISDOL) 1.25 MG (50000 UNIT) CAPS capsule 1 CAP WEEKLY FOR 8 WEEKS THEN EVERY 2 WEEKS. 11/14/21   Martinique, Betty G, MD  ? ? ?Family History ?Family History  ?Problem Relation Age of Onset  ? Ovarian cancer Mother   ?     2008  ? Osteoporosis Mother   ? Dementia Father   ? ? ?Social History ?Social History  ? ?Tobacco Use  ? Smoking status: Former  ?  Types: Cigarettes  ? Smokeless tobacco: Never  ?Vaping Use  ? Vaping Use: Never used  ?Substance Use Topics  ? Alcohol use: Yes  ?  Comment: occasionally  ? Drug use: No  ? ? ? ?Allergies   ?Sulfa antibiotics, Sulfonamide derivatives, and Penicillins ? ? ?Review of Systems ?Review of Systems ?Defer to HPI ? ? ?Physical Exam ?Triage Vital Signs ?ED Triage Vitals [11/24/21 1030]  ?Enc Vitals Group  ?   BP 136/88  ?   Pulse Rate 61  ?   Resp 18  ?   Temp 97.7 ?F (36.5 ?C)  ?   Temp Source Oral  ?   SpO2 99 %  ?   Weight   ?  Height   ?   Head Circumference   ?   Peak Flow   ?   Pain Score 2  ?   Pain Loc   ?   Pain Edu?   ?   Excl. in [place]?   ? ?No data found. ? ?Updated Vital Signs ?BP 136/88   Pulse 61   Temp 97.7 ?F (36.5 ?C) (Oral)   Resp 18   SpO2 99%  ? ?Visual Acuity ?Right Eye Distance:   ?Left Eye Distance:   ?Bilateral Distance:   ? ?Right Eye Near:   ?Left Eye Near:    ?Bilateral Near:    ? ?Physical Exam ?Constitutional:   ?   Appearance: Normal appearance.  ?HENT:  ?   Head: Normocephalic.  ?   Right Ear: Tympanic membrane, ear canal and external ear normal.  ?   Left Ear: Tympanic membrane, ear canal and external ear normal.  ?   Nose: Congestion and rhinorrhea present.  ?   Right Sinus: Maxillary sinus tenderness present. No frontal sinus tenderness.  ?   Left Sinus: No maxillary sinus tenderness or frontal sinus tenderness.  ?    Mouth/Throat:  ?   Mouth: Mucous membranes are moist.  ?   Pharynx: Oropharynx is clear.  ?Eyes:  ?   Extraocular Movements: Extraocular movements intact.  ?Pulmonary:  ?   Effort: Pulmonary effort is normal.  ?Feet:  ?   Comments: 2 scabbed less than 0.5 cm lesions noted to the dorsum of the right midfoot, no swelling, tenderness or erythema noted, no drainage noted ?Neurological:  ?   Mental Status: She is alert and oriented to person, place, and time. Mental status is at baseline.  ?Psychiatric:     ?   Mood and Affect: Mood normal.     ?   Behavior: Behavior normal.  ? ? ? ?UC Treatments / Results  ?Labs ?(all labs ordered are listed, but only abnormal results are displayed) ?Labs Reviewed - No data to display ? ?EKG ? ? ?Radiology ?No results found. ? ?Procedures ?Procedures (including critical care time) ? ?Medications Ordered in UC ?Medications - No data to display ? ?Initial Impression / Assessment and Plan / UC Course  ?I have reviewed the triage vital signs and the nursing notes. ? ?Pertinent labs & imaging results that were available during my care of the patient were reviewed by me and considered in my medical decision making (see chart for details). ? ?Acute nonrecurrent maxillary sinusitis ?Tick bite of right foot, initial encounter ? ?Symptomology is consistent with a sinus infection most likely flared by a virus or pollen exposure, discussed with patient, recommended use of over-the-counter Flonase, Mucinex and antihistamine for outpatient management, may also attempt saline irrigation, Tylenol ibuprofen and warm compresses for additional support, may follow-up with urgent care or PCP if symptoms continue to persist past 10 days or worsening at any point, low suspicion that the above symptoms are related to tick bite however as patient endorses a uncomfortable sensation still present 1 month later, will run titers for Lyme disease, [facility] spotted fever and Ehrlichia, with follow-up with PCP for  any concerning values, patient may follow-up with urgent care as needed ?Final Clinical Impressions(s) / UC Diagnoses  ? ?Final diagnoses:  ?None  ? ?Discharge Instructions   ?None ?  ? ?ED Prescriptions   ?None ?  ? ?PDMP not reviewed this encounter. ?  ?Hans Eden, NP ?11/24/21 1513 ? ?

## 2021-11-24 NOTE — Discharge Instructions (Signed)
Your symptoms today are consistent with a sinus infection, sinus infections are typically caused by viruses or flared by allergies , typically they will resolve on their own and we use supportive care to manage the discomfort, I do not believe your symptoms today are related to your tick bites ? ?Tick bites appear to be healed properly, there is no current signs of infection ? ?Blood work has been obtained to check for Lyme disease, [facility] fever Ehrlichia, you will be notified of any concerning values and told how to move forward ? ?Therefore please begin the following ? ?Take Flonase every morning, this medication is a steroid nasal spray which helps to loosen secretions out of the sinus passageway as well as to reduce the amount of secretions present ? ?Take Mucinex, this medication helps to thin secretions allowing them to drain ? ?Take an antihistamine such as Claritin or Zyrtec, this medication reduces the amount of secretions that the body will produce ? ?If symptoms have not improved after consistent use of the medicine in 10 days she may follow-up with urgent care or your primary doctor for relation ? ?You may follow-up with urgent care as needed for persisting symptoms ? ?

## 2021-11-26 LAB — LYME DISEASE SEROLOGY W/REFLEX: Lyme Total Antibody EIA: NEGATIVE

## 2021-11-27 LAB — ROCKY MTN SPOTTED FVR ABS PNL(IGG+IGM)
RMSF IgG: NEGATIVE
RMSF IgM: 0.35 index (ref 0.00–0.89)

## 2021-11-27 LAB — EHRLICHIA ANTIBODY PANEL
E chaffeensis (HGE) Ab, IgG: NEGATIVE
E chaffeensis (HGE) Ab, IgM: NEGATIVE
E. Chaffeensis (HME) IgM Titer: NEGATIVE
E.Chaffeensis (HME) IgG: NEGATIVE

## 2022-01-07 NOTE — Progress Notes (Deleted)
[facility] Breast [facility] Breast Cancer Follow Up    Treatment Team  Ref Prov: Terrilee Files, MD Primary Care Physician :   Terrilee Files, MD Radiology facility: [facility])--[phone] Breast Surgeon : Henderson Baltimore MD  [phone]   Plastic Surgeon none Radiation Onc:  Miguel Dibble MD:  [phone] [address] [place]); Medical Onc:  Dr. Burt Knack        Breast Cancer Comprehensive Care Plan Summary  Date of diagnosis: 01/25/2020 Event : DCIS ECOG Performance: Grade 0  (Fully active) Genetic Testing : negative   Presentation : Abnormal mammogram with microcalcifications Side: left Focality :  unifocal Location: lateral region   Biologic Tumor characteristics  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  Ki-67:  DCIS - not indicated Oncotype Dx:  Not indicated   Estrogen Receptor: positive >90 % Progesterone Receptor: positive >90 % Her-2-neu Receptor: DCIS, not indicated     Staging  Tumor Size:   DCIS Nodes: clinically/US negative Systemic Metastases: clinically negative Stage: Stage 0 , Tis N0 M0   Treatment  Modality Date     Surgery    04/14/20 Left and partial mastectomy with ultrasound guided needle localization   Radiation    not recommended due to low Prelude   Endocrine    opted against due to minimal benefit after discussion with Dr. Virl Son   Chemotherapy Not indicated Not indicated   Biologic Not indicated Not indicated   Clinical trial            HPI   CC: Follow up left breast cancer    [name] is a 66 y.o. female patient here for follow up of her history of left breast cancer originally diagnosed in July 2021.  She is currently on Serial imaging    The patient is asymptomatic and denies any breast pain , palpable masses, skin or nipple changes or nipple discharge.     Recent Imaging:  BDM 11/23/20: Cluster of heterogeneous microcalcifications in the central left breast  in patient status post left mastectomy. Recommend stereotactic biopsy  for  further evaluation. BIRADS4    Left stereo bx 12/05/20:  Hyalinized fibroadenoma with  calcifications.    LDM 07/24/21: Interval left breast biopsy. Annual bilateral mammography will be due in  May 2023. BIRADS2    The following portions of the patient's history were reviewed and updated as appropriate: allergies, current medications, past family history, past medical history, past social history, past surgical history and problem list.     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    Family History     Family History   Problem Relation Age of Onset    Breast cancer Mother 69        bilateral breast cancer, d. metastatic breast    Multiple myeloma Mother 95    Cancer Mother     Breast cancer Sister 8        DCIS    Heart failure Brother     Ovarian cancer Paternal Grandmother 52    Heart failure Sister     Bladder Cancer Brother 29    Leukemia Nephew  7        Brother's son.    Bone cancer Maternal Grandmother 90        Review of Systems   ROS:  Constitutional:  night sweats  HEENT:  negative  Cardiovascular:  negative  Respiratory:  negative  Gastrointestinal:  negative  Genitourinary:  negative  Musculoskeletal:  negative  Skin:  negative  Neurologic:  negative  Psychiatric:  negative  Endocrine:  negative  Hematologic:  negative                     Physical Exam   LMP 02/13/2012     WDWN female in NAD  HEENT:  Clear, no scleral icterus, neck supple  Neck:  No thyromegaly or masses, trachea midline  Chest: Respiratory effort normal  BJM:  Gait and station  normal  Ext:  No CCE  Skin:  Free of significant ulcers or lesions  Neuro:  Grossly intact, no focal findings, alert and oriented, asks appropriate questions  LN:  No axillary, supraclavicular or cervical adenopathy  Breast:  Symmetric .    Right breast: No skin or nipple changes, no nipple retraction or discharge . No palpable masses    Left breast : No skin or nipple changes, no nipple retraction or discharge . No palpable masses. Well healed incision.     Rads   Radiological imaging and reports reviewed as above.     Assessment/Plan   Personal history of breast carcinoma    Patient is doing well. She currently has no evidence of disease .  Her physical and emotional recovery is good. Cosmetic outcome is good .  I have recommended continued treatment with Serial imaging    Surveillance / Folllow up recommendations:    After lumpectomy, we recommend mammograms once a year. The purpose is to monitor for local recurrence and detection of new primary.    Patient is advised to report these symptoms : new lumps, bone pain, chest pain, shortness of breath or difficulty breathing, abdominal pain, or persistent headaches.     The following tests are not recommended for routine breast cancer follow-up: breast MRI, FDG-PET scans, complete blood cell counts, automated chemistry studies, chest x-rays, bone scans, liver ultrasound, and tumor markers (CA 15-3, CA 27.29, CEA).     Nutrition  I have also discussed life-style choices to decrease risk of breast cancer such as:   Healthy eating habits, meals high in fruits, vegetables, nuts and complex carbohydrates, low in animal fats.  Limit the amount of alcohol ingested   Avoid  Hormone Replacement Therapy.    Exercise  Continue to exercise, aerobic 30 - 40 minutes , 3 -4 times / week    Follow up  Bilateral mammogram now  Follow up visit with me  in 6 months, sooner if concerns  Follow up with PCP.  Patient agrees with plan.

## 2022-01-08 ENCOUNTER — Other Ambulatory Visit (INDEPENDENT_AMBULATORY_CARE_PROVIDER_SITE_OTHER): Payer: Self-pay | Admitting: Family Nurse Practitioner

## 2022-01-10 ENCOUNTER — Ambulatory Visit (HOSPITAL_BASED_OUTPATIENT_CLINIC_OR_DEPARTMENT_OTHER): Payer: No Typology Code available for payment source | Admitting: Family Nurse Practitioner

## 2022-01-10 DIAGNOSIS — Z853 Personal history of malignant neoplasm of breast: Secondary | ICD-10-CM

## 2022-02-18 DIAGNOSIS — L821 Other seborrheic keratosis: Secondary | ICD-10-CM | POA: Diagnosis not present

## 2022-02-18 DIAGNOSIS — L815 Leukoderma, not elsewhere classified: Secondary | ICD-10-CM | POA: Diagnosis not present

## 2022-03-07 ENCOUNTER — Encounter: Payer: Self-pay | Admitting: Family

## 2022-03-07 ENCOUNTER — Ambulatory Visit (INDEPENDENT_AMBULATORY_CARE_PROVIDER_SITE_OTHER): Payer: Medicare Other | Admitting: Family

## 2022-03-07 VITALS — BP 146/90 | HR 78 | Temp 97.3°F | Ht 63.0 in | Wt 124.6 lb

## 2022-03-07 DIAGNOSIS — M7989 Other specified soft tissue disorders: Secondary | ICD-10-CM

## 2022-03-07 NOTE — Progress Notes (Signed)
   Patient ID: Leeanne Mannan, female    DOB: October 24, 1955, 66 y.o.   MRN: [id number]  Chief Complaint  Patient presents with   Leg Swelling    Pt c/o leg and feet swelling and tingling, Present for about a week. Pt states she has strange sensations in the bottom of her feet. Pt states her legs start to swell through out the day.     HPI: Bilateral leg swelling:  right > left, she had 2 broken little toes end of last year in right foot.  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 Denies pain, but legs feel uncomfortable, tight. Denies any redness or warmth, no crampy sensation.   Assessment & Plan:  1. Leg swelling no swelling noted in feet, starts about 2 inches above ankle area, non- pitting, no erythema or warmth. Advised pt to wear mild compression socks during the day, drink at least 2 L water qd and eat a low sodium diet. F/U with PCP if sx are not improving.   Subjective:    Outpatient Medications Prior to Visit  Medication Sig Dispense Refill   atorvastatin (LIPITOR) 40 MG tablet Take 1 tablet (40 mg total) by mouth daily. 90 tablet 3   VITAMIN D PO Take by mouth. (Patient not taking: Reported on 03/07/2022)     Vitamin D, Ergocalciferol, (DRISDOL) 1.25 MG (50000 UNIT) CAPS capsule 1 CAP WEEKLY FOR 8 WEEKS THEN EVERY 2 WEEKS. (Patient not taking: Reported on 03/07/2022) 12 capsule 0   No facility-administered medications prior to visit.   Past Medical History:  Diagnosis Date   Clotting disorder ([place]) 2000   lower leg from injury   History of tobacco abuse    Hyperlipidemia    Osteopenia    Past Surgical History:  Procedure Laterality Date   MULTIPLE TOOTH EXTRACTIONS  11/2011   TOTAL ABDOMINAL HYSTERECTOMY     Allergies  Allergen Reactions   Sulfa Antibiotics Itching   Sulfonamide Derivatives     REACTION: Hives   Penicillins Rash      Objective:    Physical Exam Vitals and nursing note reviewed.  Constitutional:      Appearance: Normal appearance.   Cardiovascular:     Rate and Rhythm: Normal rate and regular rhythm.  Pulmonary:     Effort: Pulmonary effort is normal.     Breath sounds: Normal breath sounds.  Musculoskeletal:        General: Normal range of motion.     Right lower leg: 1+ Edema (lower leg to just above ankle) present.     Left lower leg: Edema (lower leg to just above ankle) present.  Skin:    General: Skin is warm and dry.  Neurological:     Mental Status: She is alert.  Psychiatric:        Mood and Affect: Mood normal.        Behavior: Behavior normal.    BP (!) 146/90 (BP Location: Left Arm, Patient Position: Sitting, Cuff Size: Large)   Pulse 78   Temp (!) 97.3 F (36.3 C) (Temporal)   Ht '5\' 3"'$  (1.6 m)   Wt 124 lb 9.6 oz (56.5 kg)   SpO2 98%   BMI 22.07 kg/m  Wt Readings from Last 3 Encounters:  03/07/22 124 lb 9.6 oz (56.5 kg)  11/02/21 130 lb 2 oz (59 kg)  02/21/21 135 lb 4 oz (61.3 kg)       Jeanie Sewer, NP

## 2022-03-07 NOTE — Patient Instructions (Addendum)
It was very nice to see you today!    As discussed, look for mild compression socks to wear during the day to decrease the swelling in your legs. These can be found on [employer], [employer] or [place].  Be sure to drink at least 2 Liters = 8 cups of water daily. Eat a low sodium diet including beverages.   Follow up with your primary provider if the swelling is not improving.       PLEASE NOTE:  If you had any lab tests please let us know if you have not heard back within a few days. You may see your results on MyChart before we have a chance to review them but we will give you a call once they are reviewed by [place]. If we ordered any referrals today, please let us know if you have not heard from their office within the next week.

## 2022-03-15 DIAGNOSIS — E559 Vitamin D deficiency, unspecified: Secondary | ICD-10-CM | POA: Diagnosis not present

## 2022-03-15 DIAGNOSIS — M810 Age-related osteoporosis without current pathological fracture: Secondary | ICD-10-CM | POA: Diagnosis not present

## 2022-03-19 ENCOUNTER — Ambulatory Visit: Payer: Medicare Other

## 2022-03-19 ENCOUNTER — Telehealth: Payer: Self-pay | Admitting: Family Medicine

## 2022-03-19 NOTE — Telephone Encounter (Signed)
Pt called to say she received a text saying she had an appt on 9/523 at [facility].  When Pt arrived to [facility], she was told she had an appt with the RN to get a second TB.  Pt was very upset because this appointment was not made by her.  Pt stated she went to [facility], as per text, and missed half a day of work.   Pt is very concerned about HIPAA. Pt wants to make sure her chart is not connected to anyone else's chart in any way.   Please advise.

## 2022-03-19 NOTE — Telephone Encounter (Signed)
Not sure what to do with this.Marland Kitchen it was scheduled by [facility].

## 2022-03-21 NOTE — Telephone Encounter (Addendum)
I have spoken with patient in regard. I have explained this was a scheduling error and have apologized.   I have asked patient to give me a call if she has any further questions or concerns.

## 2022-03-22 DIAGNOSIS — E559 Vitamin D deficiency, unspecified: Secondary | ICD-10-CM | POA: Diagnosis not present

## 2022-03-22 DIAGNOSIS — M810 Age-related osteoporosis without current pathological fracture: Secondary | ICD-10-CM | POA: Diagnosis not present

## 2022-03-25 ENCOUNTER — Ambulatory Visit: Payer: Medicare Other | Admitting: Podiatry

## 2022-03-25 ENCOUNTER — Ambulatory Visit (INDEPENDENT_AMBULATORY_CARE_PROVIDER_SITE_OTHER): Payer: Medicare Other

## 2022-03-25 DIAGNOSIS — M79671 Pain in right foot: Secondary | ICD-10-CM

## 2022-03-25 DIAGNOSIS — M7751 Other enthesopathy of right foot: Secondary | ICD-10-CM | POA: Diagnosis not present

## 2022-03-25 MED ORDER — MELOXICAM 15 MG PO TABS: 15.0000 mg | ORAL_TABLET | Freq: Every day | ORAL | 2 refills | Status: DC

## 2022-03-25 NOTE — Progress Notes (Signed)
Subjective:   Patient ID: Leslie Boyer, female   DOB: 66 y.o.   MRN: [id number]   HPI Patient presents stating she has developed discomfort in her right second metatarsal phalangeal joint and its been mild but it is bothersome and she was just concerned and wants to have it evaluated.  Patient does not smoke likes to be active   Review of Systems  All other systems reviewed and are negative.       Objective:  Physical Exam Vitals and nursing note reviewed.  Constitutional:      Appearance: She is well-developed.  Pulmonary:     Effort: Pulmonary effort is normal.  Musculoskeletal:        General: Normal range of motion.  Skin:    General: Skin is warm.  Neurological:     Mental Status: She is alert.     Neurovascular status intact muscle strength found to be adequate range of motion adequate with discomfort in the second MPJ of a mild nature with very minimal swelling no pain in the third or fourth MPJ.  Digits have previous fracture but they have healed well with good digital perfusion     Assessment:  In laboratory capsulitis of the second MPJ right low-grade nature currently     Plan:  H&P done reviewed condition and x-ray and I went ahead and applied a metatarsal pad to offload weight off the lesser metatarsals instructed on supportive shoes and reappoint if symptoms continue and may require other treatment.  Placed on Mobic 15 mg daily  X-rays were negative for signs of fracture or bony condition associated with this

## 2022-04-02 NOTE — Progress Notes (Unsigned)
[facility] Breast [facility] Breast Cancer Follow Up    Treatment Team  Ref Prov: Terrilee Files, MD Primary Care Physician :   Terrilee Files, MD Radiology facility: [facility])--[phone] Breast Surgeon : Henderson Baltimore MD  [phone]   Plastic Surgeon none Radiation Onc:  Miguel Dibble MD:  [phone] More[facility]); Medical Onc:  Dr. Burt Knack        Breast Cancer Comprehensive Care Plan Summary  Date of diagnosis: 01/25/2020 Event : DCIS ECOG Performance: Grade 0  (Fully active) Genetic Testing : negative   Presentation : Abnormal mammogram with microcalcifications Side: left Focality :  unifocal Location: lateral region   Biologic Tumor characteristics  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  Ki-67:  DCIS - not indicated Oncotype Dx:  Not indicated   Estrogen Receptor: positive >90 % Progesterone Receptor: positive >90 % Her-2-neu Receptor: DCIS, not indicated     Staging  Tumor Size:   DCIS Nodes: clinically/US negative Systemic Metastases: clinically negative Stage: Stage 0 , Tis N0 M0   Treatment  Modality Date     Surgery    04/14/20 Left and partial mastectomy with ultrasound guided needle localization   Radiation    not recommended due to low Prelude   Endocrine    opted against due to minimal benefit after discussion with Dr. Virl Son   Chemotherapy Not indicated Not indicated   Biologic Not indicated Not indicated   Clinical trial            HPI   CC: Follow up left breast cancer    [name] is a 66 y.o. female patient here for follow up of her history of left breast cancer originally diagnosed in July 2021.  She is currently on Serial imaging    The patient is asymptomatic and denies any breast pain , palpable masses, skin or nipple changes or nipple discharge.     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     Recent Imaging:  BDM 11/23/20: Cluster of heterogeneous microcalcifications in the central left  breast  in patient status post left mastectomy. Recommend stereotactic biopsy for  further evaluation. BIRADS4    Left stereo bx 12/05/20:  Hyalinized fibroadenoma with  calcifications.    LDM 07/24/21: Interval left breast biopsy. Annual bilateral mammography will be due in  May 2023. BIRADS2    BDM 01/08/22: No mammographic evidence for malignancy. BIRADS2    The following portions of the patient's history were reviewed and updated as appropriate: allergies, current medications, past family history, past medical history, past social history, past surgical history and problem list.     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    Family History     Family History   Problem Relation Age of Onset    Breast cancer Mother 21        bilateral breast cancer, d. metastatic breast    Multiple myeloma Mother 65    Cancer Mother     Breast cancer Sister 36        DCIS    Heart failure Brother  Ovarian cancer Paternal Grandmother 9    Heart failure Sister     Bladder Cancer Brother 54    Leukemia Nephew 7        Brother's son.    Bone cancer Maternal Grandmother 90        Review of Systems   ROS:  Constitutional:  night sweats  HEENT:  negative  Cardiovascular:  negative  Respiratory:  negative  Gastrointestinal:  negative  Genitourinary:  negative  Musculoskeletal:  negative  Skin:  negative  Neurologic:  negative  Psychiatric:  negative  Endocrine:  negative  Hematologic:  negative                     Physical Exam   BP 157/87 (BP Site: Left arm, Patient Position:  Sitting)   Pulse 70   Ht 1.626 m (5\' 4" )   Wt 95.3 kg (210 lb)   LMP 02/13/2012   SpO2 96%   BMI 36.05 kg/m     WDWN female in NAD  HEENT:  Clear, no scleral icterus, neck supple  Neck:  No thyromegaly or masses, trachea midline  Chest: Respiratory effort normal  BJM:  Gait and station normal  Ext:  No CCE  Skin:  Free of significant ulcers or lesions  Neuro:  Grossly intact, no focal findings, alert and oriented, asks appropriate questions  LN:  No axillary, supraclavicular or cervical adenopathy  Breast:  Symmetric .    Right breast: No skin or nipple changes, no nipple retraction or discharge . No palpable masses    Left breast : No skin or nipple changes, no nipple retraction or discharge . No palpable masses. Well healed incision.     Rads   Radiological imaging and reports reviewed as above.     Assessment/Plan   Personal history of breast carcinoma    Patient is doing well. She currently has no evidence of disease .  Her physical and emotional recovery is good. Cosmetic outcome is good .  I have recommended continued treatment with Serial imaging    Surveillance / Folllow up recommendations:    After lumpectomy, we recommend mammograms once a year. The purpose is to monitor for local recurrence and detection of new primary.    Patient is advised to report these symptoms : new lumps, bone pain, chest pain, shortness of breath or difficulty breathing, abdominal pain, or persistent headaches.     The following tests are not recommended for routine breast cancer follow-up: breast MRI, FDG-PET scans, complete blood cell counts, automated chemistry studies, chest x-rays, bone scans, liver ultrasound, and tumor markers (CA 15-3, CA 27.29, CEA).     Nutrition  I have also discussed life-style choices to decrease risk of breast cancer such as:   Healthy eating habits, meals high in fruits, vegetables, nuts and complex carbohydrates, low in animal fats.  Limit the amount of alcohol ingested   Avoid  Hormone  Replacement Therapy.    Exercise  Continue to exercise, aerobic 30 - 40 minutes , 3 -4 times / week    Follow up  BDM in June 2024  Follow up visit with me  in 12 months, sooner if concerns  Follow up with PCP.  Patient agrees with plan.

## 2022-04-04 ENCOUNTER — Encounter (HOSPITAL_BASED_OUTPATIENT_CLINIC_OR_DEPARTMENT_OTHER): Payer: Self-pay | Admitting: Family Nurse Practitioner

## 2022-04-04 ENCOUNTER — Ambulatory Visit (INDEPENDENT_AMBULATORY_CARE_PROVIDER_SITE_OTHER): Payer: No Typology Code available for payment source | Admitting: Family Nurse Practitioner

## 2022-04-04 VITALS — BP 157/87 | HR 70 | Ht 64.0 in | Wt 210.0 lb

## 2022-04-04 DIAGNOSIS — Z853 Personal history of malignant neoplasm of breast: Secondary | ICD-10-CM

## 2022-04-12 ENCOUNTER — Other Ambulatory Visit: Payer: Self-pay | Admitting: Family Medicine

## 2022-04-12 DIAGNOSIS — E785 Hyperlipidemia, unspecified: Secondary | ICD-10-CM

## 2022-04-14 DEATH — deceased

## 2022-05-02 ENCOUNTER — Telehealth: Payer: Self-pay

## 2022-05-02 NOTE — Progress Notes (Signed)
This encounter was created in error - please disregard.

## 2022-05-02 NOTE — Telephone Encounter (Signed)
Unsuccessful attempt to reach patient on preferred number listed in notes for scheduled AWV. Mailbox full unable to leave message.

## 2022-05-22 DIAGNOSIS — Z1231 Encounter for screening mammogram for malignant neoplasm of breast: Secondary | ICD-10-CM | POA: Diagnosis not present

## 2022-08-19 DIAGNOSIS — Z01419 Encounter for gynecological examination (general) (routine) without abnormal findings: Secondary | ICD-10-CM | POA: Diagnosis not present

## 2022-08-19 DIAGNOSIS — Z6820 Body mass index (BMI) 20.0-20.9, adult: Secondary | ICD-10-CM | POA: Diagnosis not present

## 2022-08-20 ENCOUNTER — Telehealth: Payer: Self-pay | Admitting: Family Medicine

## 2022-08-20 NOTE — Telephone Encounter (Signed)
Left message for patient to call back and schedule Medicare Annual Wellness Visit (AWV) either virtually or in office. Left  my Herbie Drape number [phone]   due 04/14/2022 awvi per [place] please schedule with Nurse Health Adviser   45 min for awv-i  in office appointments 30 min for awv-s & awv-i phone/virtual appointments

## 2022-08-21 DIAGNOSIS — K080 Exfoliation of teeth due to systemic causes: Secondary | ICD-10-CM | POA: Diagnosis not present

## 2022-11-18 ENCOUNTER — Telehealth: Payer: Self-pay | Admitting: Family Medicine

## 2022-11-18 NOTE — Telephone Encounter (Signed)
Contacted Leslie Boyer to schedule their annual wellness visit. Appointment made for 11/22/22.  Leslie Boyer AWV direct phone # [phone]

## 2022-11-22 ENCOUNTER — Ambulatory Visit (INDEPENDENT_AMBULATORY_CARE_PROVIDER_SITE_OTHER): Payer: Medicare Other

## 2022-11-22 VITALS — Ht 63.0 in | Wt 124.0 lb

## 2022-11-22 DIAGNOSIS — Z1211 Encounter for screening for malignant neoplasm of colon: Secondary | ICD-10-CM

## 2022-11-22 DIAGNOSIS — Z0000 Encounter for general adult medical examination without abnormal findings: Secondary | ICD-10-CM

## 2022-11-22 NOTE — Patient Instructions (Addendum)
Leslie Boyer , Thank you for taking time to come for your Medicare Wellness Visit. I appreciate your ongoing commitment to your health goals. Please review the following plan we discussed and let me know if I can assist you in the future.   These are the goals we discussed:  Goals       No current goals (pt-stated)        This is a list of the screening recommended for you and due dates:  Health Maintenance  Topic Date Due   DTaP/Tdap/Td vaccine (2 - Tdap) 01/08/2019   COVID-19 Vaccine (1) 12/08/2022*   Pneumonia Vaccine (1 of 2 - PCV) 11/22/2023*   Colon Cancer Screening  11/22/2023*   Hepatitis C Screening: USPSTF Recommendation to screen - Ages 18-79 yo.  11/22/2023*   Flu Shot  02/13/2023   Mammogram  05/17/2023   Medicare Annual Wellness Visit  11/22/2023   DEXA scan (bone density measurement)  Completed   Zoster (Shingles) Vaccine  Completed   HPV Vaccine  Aged Out  *Topic was postponed. The date shown is not the original due date.    Advanced directives: Advance directive discussed with you today. Even though you declined this today, please call our office should you change your mind, and we can give you the proper paperwork for you to fill out.   Conditions/risks identified: None  Next appointment: Follow up in one year for your annual wellness visit    Preventive Care 65 Years and Older, Female Preventive care refers to lifestyle choices and visits with your health care provider that can promote health and wellness. What does preventive care include? A yearly physical exam. This is also called an annual well check. Dental exams once or twice a year. Routine eye exams. Ask your health care provider how often you should have your eyes checked. Personal lifestyle choices, including: Daily care of your teeth and gums. Regular physical activity. Eating a healthy diet. Avoiding tobacco and drug use. Limiting alcohol use. Practicing safe sex. Taking low-dose aspirin  every day. Taking vitamin and mineral supplements as recommended by your health care provider. What happens during an annual well check? The services and screenings done by your health care provider during your annual well check will depend on your age, overall health, lifestyle risk factors, and family history of disease. Counseling  Your health care provider may ask you questions about your: Alcohol use. Tobacco use. Drug use. Emotional well-being. Home and relationship well-being. Sexual activity. Eating habits. History of falls. Memory and ability to understand (cognition). Work and work Astronomer. Reproductive health. Screening  You may have the following tests or measurements: Height, weight, and BMI. Blood pressure. Lipid and cholesterol levels. These may be checked every 5 years, or more frequently if you are over 82 years old. Skin check. Lung cancer screening. You may have this screening every year starting at age 13 if you have a 30-pack-year history of smoking and currently smoke or have quit within the past 15 years. Fecal occult blood test (FOBT) of the stool. You may have this test every year starting at age 69. Flexible sigmoidoscopy or colonoscopy. You may have a sigmoidoscopy every 5 years or a colonoscopy every 10 years starting at age 64. Hepatitis C blood test. Hepatitis B blood test. Sexually transmitted disease (STD) testing. Diabetes screening. This is done by checking your blood sugar (glucose) after you have not eaten for a while (fasting). You may have this done every 1-3 years. Bone density  scan. This is done to screen for osteoporosis. You may have this done starting at age 105. Mammogram. This may be done every 1-2 years. Talk to your health care provider about how often you should have regular mammograms. Talk with your health care provider about your test results, treatment options, and if necessary, the need for more tests. Vaccines  Your health  care provider may recommend certain vaccines, such as: Influenza vaccine. This is recommended every year. Tetanus, diphtheria, and acellular pertussis (Tdap, Td) vaccine. You may need a Td booster every 10 years. Zoster vaccine. You may need this after age 37. Pneumococcal 13-valent conjugate (PCV13) vaccine. One dose is recommended after age 73. Pneumococcal polysaccharide (PPSV23) vaccine. One dose is recommended after age 11. Talk to your health care provider about which screenings and vaccines you need and how often you need them. This information is not intended to replace advice given to you by your health care provider. Make sure you discuss any questions you have with your health care provider. Document Released: 07/28/2015 Document Revised: 03/20/2016 Document Reviewed: 05/02/2015 Elsevier Interactive Patient Education  2017 [employer].  Fall Prevention in the Home Falls can cause injuries. They can happen to people of all ages. There are many things you can do to make your home safe and to help prevent falls. What can I do on the outside of my home? Regularly fix the edges of walkways and driveways and fix any cracks. Remove anything that might make you trip as you walk through a door, such as a raised step or threshold. Trim any bushes or trees on the path to your home. Use bright outdoor lighting. Clear any walking paths of anything that might make someone trip, such as rocks or tools. Regularly check to see if handrails are loose or broken. Make sure that both sides of any steps have handrails. Any raised decks and porches should have guardrails on the edges. Have any leaves, snow, or ice cleared regularly. Use sand or salt on walking paths during winter. Clean up any spills in your garage right away. This includes oil or grease spills. What can I do in the bathroom? Use night lights. Install grab bars by the toilet and in the tub and shower. Do not use towel bars as grab  bars. Use non-skid mats or decals in the tub or shower. If you need to sit down in the shower, use a plastic, non-slip stool. Keep the floor dry. Clean up any water that spills on the floor as soon as it happens. Remove soap buildup in the tub or shower regularly. Attach bath mats securely with double-sided non-slip rug tape. Do not have throw rugs and other things on the floor that can make you trip. What can I do in the bedroom? Use night lights. Make sure that you have a light by your bed that is easy to reach. Do not use any sheets or blankets that are too big for your bed. They should not hang down onto the floor. Have a firm chair that has side arms. You can use this for support while you get dressed. Do not have throw rugs and other things on the floor that can make you trip. What can I do in the kitchen? Clean up any spills right away. Avoid walking on wet floors. Keep items that you use a lot in easy-to-reach places. If you need to reach something above you, use a strong step stool that has a grab bar. Keep electrical  cords out of the way. Do not use floor polish or wax that makes floors slippery. If you must use wax, use non-skid floor wax. Do not have throw rugs and other things on the floor that can make you trip. What can I do with my stairs? Do not leave any items on the stairs. Make sure that there are handrails on both sides of the stairs and use them. Fix handrails that are broken or loose. Make sure that handrails are as long as the stairways. Check any carpeting to make sure that it is firmly attached to the stairs. Fix any carpet that is loose or worn. Avoid having throw rugs at the top or bottom of the stairs. If you do have throw rugs, attach them to the floor with carpet tape. Make sure that you have a light switch at the top of the stairs and the bottom of the stairs. If you do not have them, ask someone to add them for you. What else can I do to help prevent  falls? Wear shoes that: Do not have high heels. Have rubber bottoms. Are comfortable and fit you well. Are closed at the toe. Do not wear sandals. If you use a stepladder: Make sure that it is fully opened. Do not climb a closed stepladder. Make sure that both sides of the stepladder are locked into place. Ask someone to hold it for you, if possible. Clearly mark and make sure that you can see: Any grab bars or handrails. First and last steps. Where the edge of each step is. Use tools that help you move around (mobility aids) if they are needed. These include: Canes. Walkers. Scooters. Crutches. Turn on the lights when you go into a dark area. Replace any light bulbs as soon as they burn out. Set up your furniture so you have a clear path. Avoid moving your furniture around. If any of your floors are uneven, fix them. If there are any pets around you, be aware of where they are. Review your medicines with your doctor. Some medicines can make you feel dizzy. This can increase your chance of falling. Ask your doctor what other things that you can do to help prevent falls. This information is not intended to replace advice given to you by your health care provider. Make sure you discuss any questions you have with your health care provider. Document Released: 04/27/2009 Document Revised: 12/07/2015 Document Reviewed: 08/05/2014 Elsevier Interactive Patient Education  2017 [employer].

## 2022-11-22 NOTE — Progress Notes (Signed)
Subjective:   Leslie Boyer is a 67 y.o. female who presents for Medicare Annual (Subsequent) preventive examination.  Review of Systems    Virtual Visit via Telephone Note  I connected with  Leslie Boyer on 11/22/22 at  2:30 PM EDT by telephone and verified that I am speaking with the correct person using two identifiers.  Location: Patient: Home Provider: Office Persons participating in the virtual visit: patient/Nurse Health Advisor   I discussed the limitations, risks, security and privacy concerns of performing an evaluation and management service by telephone and the availability of in person appointments. The patient expressed understanding and agreed to proceed.  Interactive audio and video telecommunications were attempted between this nurse and patient, however failed, due to patient having technical difficulties OR patient did not have access to video capability.  We continued and completed visit with audio only.  Some vital signs may be absent or patient reported.   Leslie Rung, LPN  Cardiac Risk Factors include: advanced age (>32men, >63 women)     Objective:    Today's Vitals   11/22/22 1413  Weight: 124 lb (56.2 kg)  Height: 5\' 3"  (1.6 m)   Body mass index is 21.97 kg/m.     11/22/2022    2:19 PM 03/08/2017    8:36 PM  Advanced Directives  Does Patient Have a Medical Advance Directive? No No  Would patient like information on creating a medical advance directive? No - Patient declined No - Patient declined    Current Medications (verified) Outpatient Encounter Medications as of 11/22/2022  Medication Sig   atorvastatin (LIPITOR) 40 MG tablet TAKE 1 TABLET BY MOUTH EVERY DAY   meloxicam (MOBIC) 15 MG tablet Take 1 tablet (15 mg total) by mouth daily.   VITAMIN D PO Take by mouth.   No facility-administered encounter medications on file as of 11/22/2022.    Allergies (verified) Sulfa antibiotics, Sulfonamide derivatives, and Penicillins    History: Past Medical History:  Diagnosis Date   Clotting disorder (HCC) 2000   lower leg from injury   History of tobacco abuse    Hyperlipidemia    Osteopenia    Past Surgical History:  Procedure Laterality Date   MULTIPLE TOOTH EXTRACTIONS  11/2011   TOTAL ABDOMINAL HYSTERECTOMY     Family History  Problem Relation Age of Onset   Ovarian cancer Mother        2008   Osteoporosis Mother    Dementia Father    Social History   Socioeconomic History   Marital status: Divorced    Spouse name: Not on file   Number of children: Not on file   Years of education: Not on file   Highest education level: Not on file  Occupational History   Not on file  Tobacco Use   Smoking status: Former    Types: Cigarettes   Smokeless tobacco: Never  Vaping Use   Vaping Use: Never used  Substance and Sexual Activity   Alcohol use: Yes    Comment: occasionally   Drug use: No   Sexual activity: Not on file  Other Topics Concern   Not on file  Social History Narrative   Former Smoker   Alcohol use-no     Occupation:  [employer]     3 daughters   1 son   Divorced  (husband had drug problem)    Social Determinants of Health   Boyer Resource Strain: Low Risk  (11/22/2022)  Overall Boyer Resource Strain (CARDIA)    Difficulty of Paying Living Expenses: Not hard at all  Food Insecurity: No Food Insecurity (11/22/2022)   Hunger Vital Sign    Worried About Running Out of Food in the Last Year: Never true    Ran Out of Food in the Last Year: Never true  Transportation Needs: No Transportation Needs (11/22/2022)   PRAPARE - Administrator, Civil Service (Medical): No    Lack of Transportation (Non-Medical): No  Physical Activity: Insufficiently Active (11/22/2022)   Exercise Vital Sign    Days of Exercise per Week: 1 day    Minutes of Exercise per Session: 30 min  Stress: No Stress Concern Present (11/22/2022)   [employer] of Occupational Health -  Occupational Stress Questionnaire    Feeling of Stress : Not at all  Social Connections: Moderately Integrated (11/22/2022)   Social Connection and Isolation Panel [NHANES]    Frequency of Communication with Friends and Family: More than three times a week    Frequency of Social Gatherings with Friends and Family: More than three times a week    Attends Religious Services: More than 4 times per year    Active Member of Leslie Boyer or Organizations: Yes    Attends Engineer, structural: More than 4 times per year    Marital Status: Divorced    Tobacco Counseling Counseling given: Not Answered   Clinical Intake:  Pre-visit preparation completed: Yes  Pain : No/denies pain     BMI - recorded: 21.97 Nutritional Status: BMI of 19-24  Normal Nutritional Risks: None Diabetes: No  How often do you need to have someone help you when you read instructions, pamphlets, or other written materials from your doctor or pharmacy?: 1 - Never  Diabetic?  No  Interpreter Needed?: No  Information entered by :: Leslie Mulligan LPN   Activities of Daily Living    11/22/2022    2:17 PM  In your present state of health, do you have any difficulty performing the following activities:  Hearing? 0  Vision? 0  Difficulty concentrating or making decisions? 0  Walking or climbing stairs? 0  Dressing or bathing? 0  Doing errands, shopping? 0  Preparing Food and eating ? N  Using the Toilet? N  In the past six months, have you accidently leaked urine? N  Do you have problems with loss of bowel control? N  Managing your Medications? N  Managing your Finances? N  Housekeeping or managing your Housekeeping? N    Patient Care Team: [place], Betty G, MD as PCP - General (Family Medicine)  Indicate any recent Medical Services you may have received from other than [facility] providers in the past year (date may be approximate).     Assessment:   This is a routine wellness examination for  Leslie Boyer.  Hearing/Vision screen Hearing Screening - Comments:: Denies hearing difficulties   Vision Screening - Comments:: Wears rx glasses - up to date with routine eye exams with  [facility]  Dietary issues and exercise activities discussed: Exercise limited by: None identified   Goals Addressed               This Visit's Progress     No current goals (pt-stated)         Depression Screen    11/22/2022    2:17 PM 03/07/2022    3:20 PM 02/21/2021   11:46 PM 03/21/2017   11:05 AM  PHQ 2/9 Scores  PHQ - 2 Score 0 5 0 0  PHQ- 9 Score  14      Fall Risk    11/22/2022    2:18 PM 11/02/2021   12:46 PM  Fall Risk   Falls in the past year? 0 0  Number falls in past yr: 0 0  Injury with Fall? 0 0  Risk for fall due to : No Fall Risks No Fall Risks  Follow up Falls prevention discussed Falls evaluation completed    FALL RISK PREVENTION PERTAINING TO THE HOME:  Any stairs in or around the home? No  If so, are there any without handrails? No  Home free of loose throw rugs in walkways, pet beds, electrical cords, etc? Yes  Adequate lighting in your home to reduce risk of falls? Yes   ASSISTIVE DEVICES UTILIZED TO PREVENT FALLS:  Life alert? No  Use of a cane, walker or w/c? No  Grab bars in the bathroom? No  Shower chair or bench in shower? No  Elevated toilet seat or a handicapped toilet? No   TIMED UP AND GO:  Was the test performed? No . Audio Visit  Cognitive Function:        11/22/2022    2:19 PM  6CIT Screen  What Year? 0 points  What month? 0 points  What time? 0 points  Count back from 20 0 points  Months in reverse 0 points  Repeat phrase 0 points  Total Score 0 points    Immunizations Immunization History  Administered Date(s) Administered   Fluad Quad(high Dose 65+) 05/03/2022   Influenza Inj Mdck Quad Pf 04/04/2018   Influenza,inj,Quad PF,6+ Mos 04/07/2019   Td 01/07/2009   Zoster Recombinat (Shingrix) 02/27/2021, 05/03/2022     TDAP status: Due, Education has been provided regarding the importance of this vaccine. Advised may receive this vaccine at local pharmacy or Health Dept. Aware to provide a copy of the vaccination record if obtained from local pharmacy or Health Dept. Verbalized acceptance and understanding.  Flu Vaccine status: Up to date  Pneumococcal vaccine status: Due, Education has been provided regarding the importance of this vaccine. Advised may receive this vaccine at local pharmacy or Health Dept. Aware to provide a copy of the vaccination record if obtained from local pharmacy or Health Dept. Verbalized acceptance and understanding.  Covid-19 vaccine status: Completed vaccines  Qualifies for Shingles Vaccine? Yes   Zostavax completed Yes   Shingrix Completed?: Yes  Screening Tests Health Maintenance  Topic Date Due   DTaP/Tdap/Td (2 - Tdap) 01/08/2019   COVID-19 Vaccine (1) 12/08/2022 (Originally 04/30/1961)   Pneumonia Vaccine 71+ Years old (1 of 2 - PCV) 11/22/2023 (Originally 04/30/1962)   COLONOSCOPY (Pts 45-74yrs Insurance coverage will need to be confirmed)  11/22/2023 (Originally 04/28/2015)   Hepatitis C Screening  11/22/2023 (Originally 04/30/1974)   INFLUENZA VACCINE  02/13/2023   MAMMOGRAM  05/17/2023   Medicare Annual Wellness (AWV)  11/22/2023   DEXA SCAN  Completed   Zoster Vaccines- Shingrix  Completed   HPV VACCINES  Aged Out    Health Maintenance  Health Maintenance Due  Topic Date Due   DTaP/Tdap/Td (2 - Tdap) 01/08/2019    Colorectal cancer screening: Referral to GI placed 11/22/22. Pt aware the office will call re: appt.  Mammogram status: Completed 05/16/21. Repeat every year  Bone Density status: Completed 07/31/21. Results reflect: Bone density results: OSTEOPOROSIS. Repeat every   years.  Lung Cancer Screening: (Low Dose CT Chest recommended if Age  55-80 years, 30 pack-year currently smoking OR have quit w/in 15years.) does not qualify.      Additional Screening:  Hepatitis C Screening: does qualify; Deferred  Vision Screening: Recommended annual ophthalmology exams for early detection of glaucoma and other disorders of the eye. Is the patient up to date with their annual eye exam?  Yes  Who is the provider or what is the name of the office in which the patient attends annual eye exams? [facility] If pt is not established with a provider, would they like to be referred to a provider to establish care? No .   Dental Screening: Recommended annual dental exams for proper oral hygiene  Community Resource Referral / Chronic Care Management:  CRR required this visit?  No   CCM required this visit?  No      Plan:     I have personally reviewed and noted the following in the patient's chart:   Medical and social history Use of alcohol, tobacco or illicit drugs  Current medications and supplements including opioid prescriptions. Patient is not currently taking opioid prescriptions. Functional ability and status Nutritional status Physical activity Advanced directives List of other physicians Hospitalizations, surgeries, and ER visits in previous 12 months Vitals Screenings to include cognitive, depression, and falls Referrals and appointments  In addition, I have reviewed and discussed with patient certain preventive protocols, quality metrics, and best practice recommendations. A written personalized care plan for preventive services as well as general preventive health recommendations were provided to patient.     Leslie Rung, LPN   1/61/0960   Nurse Notes: Patient due Hep-C Screening

## 2022-12-24 DIAGNOSIS — K080 Exfoliation of teeth due to systemic causes: Secondary | ICD-10-CM | POA: Diagnosis not present

## 2022-12-31 ENCOUNTER — Other Ambulatory Visit: Payer: Self-pay | Admitting: Nurse Practitioner

## 2022-12-31 DIAGNOSIS — Z853 Personal history of malignant neoplasm of breast: Secondary | ICD-10-CM

## 2023-01-02 ENCOUNTER — Telehealth: Payer: Self-pay | Admitting: Nurse Practitioner

## 2023-01-02 NOTE — Telephone Encounter (Signed)
Patient is requesting if Wynona Canes can give her a call back. Call back number is [phone].

## 2023-01-03 NOTE — Telephone Encounter (Signed)
Spoke to the pt,stated that does not have any question at this time. She found it out how she can print her order from My chart.

## 2023-01-11 ENCOUNTER — Encounter (HOSPITAL_COMMUNITY): Payer: Self-pay

## 2023-01-11 ENCOUNTER — Ambulatory Visit (HOSPITAL_COMMUNITY)
Admission: EM | Admit: 2023-01-11 | Discharge: 2023-01-11 | Disposition: A | Discharge status: Home / Self Care | Payer: Medicare Other | Attending: Emergency Medicine | Admitting: Emergency Medicine

## 2023-01-11 DIAGNOSIS — H00025 Hordeolum internum left lower eyelid: Secondary | ICD-10-CM | POA: Diagnosis not present

## 2023-01-11 MED ORDER — ERYTHROMYCIN 5 MG/GM OP OINT: TOPICAL_OINTMENT | OPHTHALMIC | 0 refills | Status: DC

## 2023-01-11 NOTE — ED Triage Notes (Signed)
Patient c/o left eye irritation x 2 days. Patient denies any eye drainage

## 2023-01-11 NOTE — Discharge Instructions (Signed)
Wash hands before and after applying ointment to eye. Do not rub eye. Use eye ointment as directed.  Follow up with eye doctor next week if symptoms persist.

## 2023-01-11 NOTE — ED Provider Notes (Signed)
[facility]    CSN: [id number] Arrival date & time: 01/11/23  1003      History   Chief Complaint No chief complaint on file.   HPI Leslie Boyer is a 67 y.o. female.   67 year old female, Leslie Boyer, presents to ER with chief complaint of left eye lower inner lid swelling/pain for 2 days, has been using hot packs as well as ice packs. Pt denies any trauma,injury, or visual disturbance.  The history is provided by the patient. No language interpreter was used.    Past Medical History:  Diagnosis Date   Clotting disorder (HCC) 2000   lower leg from injury   History of tobacco abuse    Hyperlipidemia    Osteopenia     Patient Active Problem List   Diagnosis Date Noted   Hordeolum internum left lower eyelid 01/11/2023   Chronic fatigue, unspecified 03/21/2017   Bilateral leg cramps 09/18/2016   Varicose veins 02/07/2014   Dyspepsia 07/27/2012   ARM PAIN, RIGHT 02/09/2009   Vitamin D deficiency 11/17/2008   Cholelithiasis without cholecystitis 11/17/2008   OVARIAN CYST, LEFT 11/17/2008   ANEMIA 06/12/2007   TOBACCO ABUSE 06/12/2007   Hyperlipidemia 06/08/2007    Past Surgical History:  Procedure Laterality Date   MULTIPLE TOOTH EXTRACTIONS  11/2011   TOTAL ABDOMINAL HYSTERECTOMY      OB History   No obstetric history on file.      Home Medications    Prior to Admission medications   Medication Sig Start Date End Date Taking? Authorizing Provider  erythromycin ophthalmic ointment Place a 1/2 inch ribbon of ointment into the left lower eyelid every 6 hours while awake x 7 days 01/11/23  Yes Krystian Ferrentino, Para March, NP  atorvastatin (LIPITOR) 40 MG tablet TAKE 1 TABLET BY MOUTH EVERY DAY 04/12/22   [place], Betty G, MD  meloxicam (MOBIC) 15 MG tablet Take 1 tablet (15 mg total) by mouth daily. 03/25/22   Lenn Sink, DPM  VITAMIN D PO Take by mouth.    [provider]    Family History Family History  Problem Relation Age of Onset    Ovarian cancer Mother        2008   Osteoporosis Mother    Dementia Father     Social History Social History   Tobacco Use   Smoking status: Former    Types: Cigarettes   Smokeless tobacco: Never  Vaping Use   Vaping Use: Never used  Substance Use Topics   Alcohol use: Yes    Comment: occasionally   Drug use: No     Allergies   Sulfa antibiotics, Sulfonamide derivatives, and Penicillins   Review of Systems Review of Systems  Eyes:  Positive for pain and redness. Negative for discharge.  All other systems reviewed and are negative.    Physical Exam Triage Vital Signs ED Triage Vitals  Enc Vitals Group     BP      Pulse      Resp      Temp      Temp src      SpO2      Weight      Height      Head Circumference      Peak Flow      Pain Score      Pain Loc      Pain Edu?      Excl. in GC?    No data found.  Updated  Vital Signs BP 114/78 (BP Location: Right Arm)   Pulse 91   Temp 98.4 F (36.9 C) (Oral)   Resp 16   SpO2 96%   Visual Acuity Right Eye Distance:   Left Eye Distance:   Bilateral Distance:    Right Eye Near:   Left Eye Near:    Bilateral Near:     Physical Exam Vitals and nursing note reviewed.  Constitutional:      Appearance: Normal appearance. She is well-developed and well-groomed.  HENT:     Head: Normocephalic.  Eyes:     General: Vision grossly intact.     Extraocular Movements: Extraocular movements intact.     Conjunctiva/sclera:     Left eye: Left conjunctiva is injected.     Pupils: Pupils are equal, round, and reactive to light.   Neck:     Trachea: Trachea normal.  Cardiovascular:     Rate and Rhythm: Normal rate and regular rhythm.     Pulses: Normal pulses.     Heart sounds: Normal heart sounds.  Pulmonary:     Effort: Pulmonary effort is normal.  Musculoskeletal:     Cervical back: Normal range of motion.  Neurological:     General: No focal deficit present.     Mental Status: She is alert and  oriented to person, place, and time.     GCS: GCS eye subscore is 4. GCS verbal subscore is 5. GCS motor subscore is 6.  Psychiatric:        Attention and Perception: Attention normal.        Mood and Affect: Mood normal.        Speech: Speech normal.        Behavior: Behavior normal. Behavior is cooperative.      UC Treatments / Results  Labs (all labs ordered are listed, but only abnormal results are displayed) Labs Reviewed - No data to display  EKG   Radiology No results found.  Procedures Procedures (including critical care time)  Medications Ordered in UC Medications - No data to display  Initial Impression / Assessment and Plan / UC Course  I have reviewed the triage vital signs and the nursing notes.  Pertinent labs & imaging results that were available during my care of the patient were reviewed by me and considered in my medical decision making (see chart for details).    Discussed exam findings and plan of care with pt, pt verbalized understanding to this provider.  Ddx: Stye, chalazion, conjunctivitis, corneal abrasion Final Clinical Impressions(s) / UC Diagnoses   Final diagnoses:  Hordeolum internum left lower eyelid     Discharge Instructions      Wash hands before and after applying ointment to eye. Do not rub eye. Use eye ointment as directed.  Follow up with eye doctor next week if symptoms persist.      ED Prescriptions     Medication Sig Dispense Auth. Provider   erythromycin ophthalmic ointment Place a 1/2 inch ribbon of ointment into the left lower eyelid every 6 hours while awake x 7 days 3.5 g Mykah Shin, Para March, NP      PDMP not reviewed this encounter.   Clancy Gourd, NP 01/11/23 1135

## 2023-02-19 ENCOUNTER — Encounter: Payer: Self-pay | Admitting: Gastroenterology

## 2023-03-11 ENCOUNTER — Other Ambulatory Visit (INDEPENDENT_AMBULATORY_CARE_PROVIDER_SITE_OTHER): Payer: Self-pay | Admitting: Nurse Practitioner

## 2023-03-24 ENCOUNTER — Encounter: Payer: Self-pay | Admitting: Gastroenterology

## 2023-03-24 ENCOUNTER — Ambulatory Visit (AMBULATORY_SURGERY_CENTER): Payer: Medicare Other

## 2023-03-24 VITALS — Ht 63.5 in | Wt 126.0 lb

## 2023-03-24 DIAGNOSIS — Z86010 Personal history of colonic polyps: Secondary | ICD-10-CM

## 2023-03-24 MED ORDER — NA SULFATE-K SULFATE-MG SULF 17.5-3.13-1.6 GM/177ML PO SOLN
1.0000 | Freq: Once | ORAL | 0 refills | Status: AC
Start: 2023-03-24 — End: 2023-03-24

## 2023-03-24 NOTE — Progress Notes (Signed)
Pre visit completed in person; Patient verified name, DOB, and address; No egg or soy allergy known to patient;  No issues known to pt with past sedation with any surgeries or procedures; Patient denies ever being told they had issues or difficulty with intubation;  No FH of Malignant Hyperthermia; Pt is not on diet pills; Pt is not on home 02;  Pt is not on blood thinners;  Pt reports issues with constipation- patient advised to increase oral fluids, activity, and fruits/veggies as allowed prior to prep;  No A fib or A flutter; Have any cardiac testing pending--NO Insurance verified during PV appt--- BCBS Medicare Pt can ambulate without assistance;  Pt denies use of chewing tobacco Discussed diabetic/weight loss medication holds; Discussed NSAID holds; Checked BMI to be less than 50; Pt instructed to use Singlecare.com or GoodRx for a price reduction on prep;  Patient's chart reviewed by Cathlyn Parsons CNRA prior to previsit and patient appropriate for the [facility].  Pre visit completed and red dot placed by patient's name on their procedure day (on provider's schedule).    Instructions printed along with a [facility] GoodRx coupon printed and given to patient during PV appt;

## 2023-04-10 ENCOUNTER — Encounter: Payer: Medicare Other | Admitting: Gastroenterology

## 2023-04-15 NOTE — Progress Notes (Unsigned)
Reason for Visit:     [name] [name] is a 67 y.o. female who presents in follow up of her history of left DCIS diagnosed in 2021.    She reports no new concerns on her self exam.    She had a bilateral mammogram at [facility] on 03/11/23:  No mammographic evidence of malignancy.  BIRADS CATEGORY 02-BENIGN FINDING      Treatment Team  Ref Prov:   Terrilee Files, MD Primary Care Physician:   Terrilee Files, MD Radiology facility:   [facility]  [phone] Breast Surgeon:   Henderson Baltimore MD  [phone]   Plastic Surgeon:   none Radiation Onc:  Miguel Dibble MD:  [phone] Einar Gip) Medical Onc:  Dr. Burt Knack        Breast Cancer Comprehensive Care Plan Summary  Date of diagnosis:   01/25/2020 Event: DCIS ECOG Performance:   Grade 0  (Fully active) Genetic Testing: negative   Presentation:   Abnormal mammogram with microcalcifications Side:   Left Focality:    unifocal Location:   lateral region   Biologic Tumor characteristics  Tumor:   Ductal Carcinoma In-situ Grade:   nuclear grade II  Ki-67:    DCIS - not indicated Oncotype Dx:  Not indicated   Estrogen Receptor:   positive >90 % Progesterone Receptor:   positive >90 % Her-2-neu Receptor: DCIS, not indicated     Staging  Tumor Size:   DCIS Nodes: clinically/US negative Systemic Metastases: clinically negative Stage:   Stage 0 , Tis N0 M0   Treatment  Modality Date     Surgery    04/14/20 Left and partial mastectomy with ultrasound guided needle localization   Radiation   not recommended due to low Prelude   Endocrine   opted against due to minimal benefit after discussion with Dr. Virl Son   Chemotherapy  Not indicated   Biologic  Not indicated   Clinical trial            Review of Systems:     Review of Systems   Constitutional: Negative.    HEENT:  Negative.     Respiratory: Negative.     Cardiovascular: Negative.    Gastrointestinal:  Positive for abdominal pain and constipation.   Endocrine: Negative.     GU/GYN: Negative.     Musculoskeletal: Negative.    Skin: Negative.    Neurological: Negative.    Hematological: Negative.    Psychiatric/Behavioral: Negative.              Physical Exam:     Vitals:    04/16/23 1100   BP: (!) 184/96   Pulse: 71   Resp: 20   Temp: 97.6 F (36.4 C)   SpO2: 96%       Physical Exam  HENT:      Head: Normocephalic.   Eyes:      Pupils: Pupils are equal, round, and reactive to light.   Pulmonary:      Effort: Pulmonary effort is normal.   Chest:   Breasts:     Right: No swelling, bleeding, inverted nipple, mass, nipple discharge, skin change or tenderness.      Left: No swelling, bleeding, inverted nipple, mass, nipple discharge, skin change or tenderness.   Musculoskeletal:         General: Normal range of motion.      Cervical back: Normal range of motion.   Lymphadenopathy:      Upper  Body:      Right upper body: No supraclavicular, axillary or pectoral adenopathy.      Left upper body: No supraclavicular, axillary or pectoral adenopathy.   Skin:     General: Skin is warm and dry.   Neurological:      Mental Status: She is alert and oriented to person, place, and time.   Psychiatric:         Mood and Affect: Mood normal.         Judgment: Judgment normal.           Assessment/Plan:       History of Left DCIS 2021    Bilateral mammogram - August 2025  Visit in the breast center - September 2025  Routine care with PCP

## 2023-04-16 ENCOUNTER — Encounter (HOSPITAL_BASED_OUTPATIENT_CLINIC_OR_DEPARTMENT_OTHER): Payer: Self-pay | Admitting: Nurse Practitioner

## 2023-04-16 ENCOUNTER — Ambulatory Visit (HOSPITAL_BASED_OUTPATIENT_CLINIC_OR_DEPARTMENT_OTHER): Payer: No Typology Code available for payment source | Admitting: Nurse Practitioner

## 2023-04-16 VITALS — BP 184/96 | HR 71 | Temp 97.6°F | Resp 20 | Ht 64.0 in | Wt 211.2 lb

## 2023-04-16 DIAGNOSIS — Z853 Personal history of malignant neoplasm of breast: Secondary | ICD-10-CM

## 2023-04-22 ENCOUNTER — Telehealth: Payer: Self-pay | Admitting: Gastroenterology

## 2023-04-22 NOTE — Telephone Encounter (Signed)
Pt advised to increase her water/fluid intake over the next few days and avoid the restricted foods until after her procedure. Pt to proceed as scheduled.

## 2023-04-22 NOTE — Telephone Encounter (Signed)
PT is scheduled for colonoscopy 10/11 and per instructions she was to start restricted diet on Sunday and did not. She is concerned if she will need to reschedule. Please advise.

## 2023-04-25 ENCOUNTER — Ambulatory Visit: Payer: Medicare Other | Admitting: Gastroenterology

## 2023-04-25 ENCOUNTER — Encounter: Payer: Self-pay | Admitting: Gastroenterology

## 2023-04-25 VITALS — BP 134/86 | HR 59 | Temp 98.6°F | Resp 11 | Ht 63.0 in | Wt 126.0 lb

## 2023-04-25 DIAGNOSIS — Z09 Encounter for follow-up examination after completed treatment for conditions other than malignant neoplasm: Secondary | ICD-10-CM

## 2023-04-25 DIAGNOSIS — D124 Benign neoplasm of descending colon: Secondary | ICD-10-CM

## 2023-04-25 DIAGNOSIS — Z860101 Personal history of adenomatous and serrated colon polyps: Secondary | ICD-10-CM

## 2023-04-25 DIAGNOSIS — Z860100 Personal history of colon polyps, unspecified: Secondary | ICD-10-CM

## 2023-04-25 MED ORDER — SODIUM CHLORIDE 0.9 % IV SOLN: 500.0000 mL | INTRAVENOUS | Status: DC

## 2023-04-25 NOTE — Progress Notes (Signed)
Called to room to assist during endoscopic procedure.  Patient ID and intended procedure confirmed with present staff. Received instructions for my participation in the procedure from the performing physician.  

## 2023-04-25 NOTE — Progress Notes (Signed)
[facility] History and Physical   Primary Care Physician:  [place], Betty G, MD   Reason for Procedure:   History of colon polyps  Plan:    colonoscopy     HPI: Leslie Boyer is a 67 y.o. female  here for colonoscopy surveillance - last exam 04/2012 - adenoma and SSP removed.   Patient denies any bowel symptoms at this time. No family history of colon cancer known. Otherwise feels well without any cardiopulmonary symptoms.   I have discussed risks / benefits of anesthesia and endoscopic procedure with Leslie Boyer and they wish to proceed with the exams as outlined today.    Past Medical History:  Diagnosis Date   Anemia    childhood   Anxiety    situational   Clotting disorder (HCC) 2000   lower leg from injury-RIGHT   Depression    situational   GERD (gastroesophageal reflux disease)    with certain foods/belching   History of tobacco abuse    Hyperlipidemia    not on med   Osteopenia    Osteoporosis     Past Surgical History:  Procedure Laterality Date   MULTIPLE TOOTH EXTRACTIONS  11/2011   TOTAL ABDOMINAL HYSTERECTOMY      Prior to Admission medications   Not on File    No current outpatient medications on file.   Current Facility-Administered Medications  Medication Dose Route Frequency Provider Last Rate Last Admin   0.9 %  sodium chloride infusion  500 mL Intravenous Continuous Keziyah Kneale, Willaim Rayas, MD        Allergies as of 04/25/2023 - Review Complete 04/25/2023  Allergen Reaction Noted   Sulfa antibiotics Itching 09/12/2014   Sulfonamide derivatives     Penicillins Rash     Family History  Problem Relation Age of Onset   Ovarian cancer Mother        2008   Osteoporosis Mother    Dementia Father    Colon polyps Neg Hx    Colon cancer Neg Hx    Esophageal cancer Neg Hx    Rectal cancer Neg Hx    Stomach cancer Neg Hx     Social History   Socioeconomic History   Marital status: Divorced    Spouse name: Not on  file   Number of children: Not on file   Years of education: Not on file   Highest education level: Not on file  Occupational History   Not on file  Tobacco Use   Smoking status: Former    Types: Cigarettes   Smokeless tobacco: Never  Vaping Use   Vaping status: Never Used  Substance and Sexual Activity   Alcohol use: Not Currently    Alcohol/week: 1.0 standard drink of alcohol    Types: 1 Glasses of wine per week    Comment: occasionally   Drug use: No   Sexual activity: Not on file  Other Topics Concern   Not on file  Social History Narrative   Former Smoker   Alcohol use-no     Occupation:  [employer]     3 daughters   1 son   Divorced  (husband had drug problem)    Social Determinants of Health   Financial Resource Strain: Low Risk  (11/22/2022)   Overall Financial Resource Strain (CARDIA)    Difficulty of Paying Living Expenses: Not hard at all  Food Insecurity: No Food Insecurity (11/22/2022)   Hunger Vital Sign    Worried About  Running Out of Food in the Last Year: Never true    Ran Out of Food in the Last Year: Never true  Transportation Needs: No Transportation Needs (11/22/2022)   PRAPARE - Administrator, Civil Service (Medical): No    Lack of Transportation (Non-Medical): No  Physical Activity: Insufficiently Active (11/22/2022)   Exercise Vital Sign    Days of Exercise per Week: 1 day    Minutes of Exercise per Session: 30 min  Stress: No Stress Concern Present (11/22/2022)   [employer] of Occupational Health - Occupational Stress Questionnaire    Feeling of Stress : Not at all  Social Connections: Moderately Integrated (11/22/2022)   Social Connection and Isolation Panel [NHANES]    Frequency of Communication with Friends and Family: More than three times a week    Frequency of Social Gatherings with Friends and Family: More than three times a week    Attends Religious Services: More than 4 times per year    Active Member of [employer]  or Organizations: Yes    Attends Engineer, structural: More than 4 times per year    Marital Status: Divorced  Intimate Partner Violence: Not At Risk (11/22/2022)   Humiliation, Afraid, Rape, and Kick questionnaire    Fear of Current or Ex-Partner: No    Emotionally Abused: No    Physically Abused: No    Sexually Abused: No    Review of Systems: All other review of systems negative except as mentioned in the HPI.  Physical Exam: Vital signs BP 128/86   Pulse 83   Temp 98.6 F (37 C)   Ht 5\' 3"  (1.6 m)   Wt 126 lb (57.2 kg)   SpO2 100%   BMI 22.32 kg/m   General:   Alert,  Well-developed, pleasant and cooperative in NAD Lungs:  Clear throughout to auscultation.   Heart:  Regular rate and rhythm Abdomen:  Soft, nontender and nondistended.   Neuro/Psych:  Alert and cooperative. Normal mood and affect. A and O x 3  Harlin Rain, MD [facility]

## 2023-04-25 NOTE — Patient Instructions (Signed)
Await pathology Please read handouts provided   YOU HAD AN ENDOSCOPIC PROCEDURE TODAY AT THE [facility]:   Refer to the procedure report that was given to you for any specific questions about what was found during the examination.  If the procedure report does not answer your questions, please call your gastroenterologist to clarify.  If you requested that your care partner not be given the details of your procedure findings, then the procedure report has been included in a sealed envelope for you to review at your convenience later.  YOU SHOULD EXPECT: Some feelings of bloating in the abdomen. Passage of more gas than usual.  Walking can help get rid of the air that was put into your GI tract during the procedure and reduce the bloating. If you had a lower endoscopy (such as a colonoscopy or flexible sigmoidoscopy) you may notice spotting of blood in your stool or on the toilet paper. If you underwent a bowel prep for your procedure, you may not have a normal bowel movement for a few days.  Please Note:  You might notice some irritation and congestion in your nose or some drainage.  This is from the oxygen used during your procedure.  There is no need for concern and it should clear up in a day or so.  SYMPTOMS TO REPORT IMMEDIATELY:  Following lower endoscopy (colonoscopy or flexible sigmoidoscopy):  Excessive amounts of blood in the stool  Significant tenderness or worsening of abdominal pains  Swelling of the abdomen that is new, acute  Fever of 100F or higher  For urgent or emergent issues, a gastroenterologist can be reached at any hour by calling [phone]. Do not use MyChart messaging for urgent concerns.    DIET:  We do recommend a small meal at first, but then you may proceed to your regular diet.  Drink plenty of fluids but you should avoid alcoholic beverages for 24 hours.  ACTIVITY:  You should plan to take it easy for the rest of today and you should NOT  DRIVE or use heavy machinery until tomorrow (because of the sedation medicines used during the test).    FOLLOW UP: Our staff will call the number listed on your records the next business day following your procedure.  We will call around 7:15- 8:00 am to check on you and address any questions or concerns that you may have regarding the information given to you following your procedure. If we do not reach you, we will leave a message.     If any biopsies were taken you will be contacted by phone or by letter within the next 1-3 weeks.  Please call us at [phone] if you have not heard about the biopsies in 3 weeks.    SIGNATURES/CONFIDENTIALITY: You and/or your care partner have signed paperwork which will be entered into your electronic medical record.  These signatures attest to the fact that that the information above on your After Visit Summary has been reviewed and is understood.  Full responsibility of the confidentiality of this discharge information lies with you and/or your care-partner.

## 2023-04-25 NOTE — Op Note (Signed)
[facility] Patient Name: Leslie Boyer Procedure Date: 04/25/2023 9:44 AM MRN: [id number] Endoscopist: Viviann Spare P. Adela Lank , MD, [id number] Age: 67 Referring MD:  Date of Birth: [birth year] Gender: Female Account #: [id number] Procedure:                Colonoscopy Indications:              High risk colon cancer surveillance: Personal                            history of colonic polyps - adenoma and sessile                            serrated polyp removed 04/2012 Medicines:                Monitored Anesthesia Care Procedure:                Pre-Anesthesia Assessment:                           - Prior to the procedure, a History and Physical                            was performed, and patient medications and                            allergies were reviewed. The patient's tolerance of                            previous anesthesia was also reviewed. The risks                            and benefits of the procedure and the sedation                            options and risks were discussed with the patient.                            All questions were answered, and informed consent                            was obtained. Prior Anticoagulants: The patient has                            taken no anticoagulant or antiplatelet agents. ASA                            Grade Assessment: II - A patient with mild systemic                            disease. After reviewing the risks and benefits,                            the patient was deemed in satisfactory condition to  undergo the procedure.                           After obtaining informed consent, the colonoscope                            was passed under direct vision. Throughout the                            procedure, the patient's blood pressure, pulse, and                            oxygen saturations were monitored continuously. The                            Olympus Scope  [id number] was introduced through the                            anus and advanced to the the cecum, identified by                            appendiceal orifice and ileocecal valve. The                            colonoscopy was performed without difficulty. The                            patient tolerated the procedure well. The quality                            of the bowel preparation was good. The ileocecal                            valve, appendiceal orifice, and rectum were                            photographed. Scope In: 9:47:43 AM Scope Out: 10:06:44 AM Scope Withdrawal Time: 0 hours 14 minutes 14 seconds  Total Procedure Duration: 0 hours 19 minutes 1 second  Findings:                 The perianal and digital rectal examinations were                            normal.                           A 3 mm polyp was found in the descending colon. The                            polyp was sessile. The polyp was removed with a                            cold snare. Resection and retrieval were complete.  Multiple small-mouthed diverticula were found in                            the sigmoid colon.                           Internal hemorrhoids were found during                            retroflexion. The hemorrhoids were small.                           The exam was otherwise without abnormality. Complications:            No immediate complications. Estimated blood loss:                            Minimal. Estimated Blood Loss:     Estimated blood loss was minimal. Impression:               - One 3 mm polyp in the descending colon, removed                            with a cold snare. Resected and retrieved.                           - Diverticulosis in the sigmoid colon.                           - Internal hemorrhoids.                           - The examination was otherwise normal. Recommendation:           - Patient has a contact number available for                             emergencies. The signs and symptoms of potential                            delayed complications were discussed with the                            patient. Return to normal activities tomorrow.                            Written discharge instructions were provided to the                            patient.                           - Resume previous diet.                           - Continue present medications.                           -  Await pathology results. Viviann Spare P. Antonieta Slaven, MD 04/25/2023 10:10:11 AM This report has been signed electronically.

## 2023-04-25 NOTE — Progress Notes (Signed)
Pt's states no medical or surgical changes since previsit or office visit. 

## 2023-04-25 NOTE — Progress Notes (Signed)
Sedate, gd SR, tolerated procedure well, VSS, report to RN 

## 2023-04-28 ENCOUNTER — Telehealth: Payer: Self-pay | Admitting: *Deleted

## 2023-04-28 NOTE — Telephone Encounter (Signed)
  Follow up Call-     04/25/2023    9:10 AM  Call back number  Post procedure Call Back phone  # [phone]  Permission to leave phone message Yes     Patient questions:  Do you have a fever, pain , or abdominal swelling? No. Pain Score  0 *  Have you tolerated food without any problems? Yes.    Have you been able to return to your normal activities? Yes.    Do you have any questions about your discharge instructions: Diet   No. Medications  No. Follow up visit  No.  Do you have questions or concerns about your Care? No.  Actions: * If pain score is 4 or above: No action needed, pain <4.

## 2023-04-29 LAB — SURGICAL PATHOLOGY

## 2023-05-02 DIAGNOSIS — K080 Exfoliation of teeth due to systemic causes: Secondary | ICD-10-CM | POA: Diagnosis not present

## 2023-05-08 ENCOUNTER — Encounter: Payer: Self-pay | Admitting: Gastroenterology

## 2023-05-28 DIAGNOSIS — Z1231 Encounter for screening mammogram for malignant neoplasm of breast: Secondary | ICD-10-CM | POA: Diagnosis not present

## 2023-08-08 DIAGNOSIS — K080 Exfoliation of teeth due to systemic causes: Secondary | ICD-10-CM | POA: Diagnosis not present

## 2023-09-01 DIAGNOSIS — K080 Exfoliation of teeth due to systemic causes: Secondary | ICD-10-CM | POA: Diagnosis not present

## 2023-09-29 DIAGNOSIS — M810 Age-related osteoporosis without current pathological fracture: Secondary | ICD-10-CM | POA: Diagnosis not present

## 2023-09-29 DIAGNOSIS — Z6821 Body mass index (BMI) 21.0-21.9, adult: Secondary | ICD-10-CM | POA: Diagnosis not present

## 2023-09-29 DIAGNOSIS — Z01419 Encounter for gynecological examination (general) (routine) without abnormal findings: Secondary | ICD-10-CM | POA: Diagnosis not present

## 2023-09-29 LAB — HM DEXA SCAN

## 2023-11-17 DIAGNOSIS — K080 Exfoliation of teeth due to systemic causes: Secondary | ICD-10-CM | POA: Diagnosis not present

## 2023-12-02 ENCOUNTER — Encounter: Payer: Self-pay | Admitting: Family Medicine

## 2023-12-02 ENCOUNTER — Ambulatory Visit (INDEPENDENT_AMBULATORY_CARE_PROVIDER_SITE_OTHER): Payer: Medicare Other | Admitting: Family Medicine

## 2023-12-02 DIAGNOSIS — Z0000 Encounter for general adult medical examination without abnormal findings: Secondary | ICD-10-CM | POA: Diagnosis not present

## 2023-12-02 NOTE — Progress Notes (Signed)
 PATIENT CHECK-IN and HEALTH RISK ASSESSMENT QUESTIONNAIRE:  -completed by phone/video for upcoming Medicare Preventive Visit  Pre-Visit Check-in: 1)Vitals (height, wt, BP, etc) - record in vitals section for visit on day of visit Request home vitals (wt, BP, etc.) and enter into vitals, THEN update Vital Signs SmartPhrase below at the top of the HPI. See below.  2)Review and Update Medications, Allergies PMH, Surgeries, Social history in Epic 3)Hospitalizations in the last year with date/reason? No   4)Review and Update Care Team (patient's specialists) in Epic 5) Complete PHQ9 in Epic  6) Complete Fall Screening in Epic 7)Review all Health Maintenance Due and order under PCP if not done.  Medicare Wellness Patient Questionnaire:  Answer theses question about your habits: How often do you have a drink containing alcohol?NO  How many drinks containing alcohol do you have on a typical day when you are drinking?Na  How often do you have six or more drinks on one occasion?NA  Have you ever smoked?Yes  Quit date if applicable? 20 years ago   How many packs a day do/did you smoke? 2 packs  Do you use smokeless tobacco?No  Do you use an illicit drugs?No  On average, how many days per week do you engage in moderate to strenuous exercise (like a brisk walk)? 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 NO Number of partners?NA  Typical breakfast: Banana, avocado smoothie Typical lunch: Salad  Typical dinner:Salad, chick- fil-a  Typical snacks:crackers or chips   Beverages: coffee, water Has lots of good friends and family support, usher at her church  Answer theses question about your everyday activities: Can you perform most household chores?Yes  Are you deaf or have significant  trouble hearing?No Do you feel that you have a problem with memory?NO  Do you feel safe at home?Yes  Last dentist visit? 2 weeks ago  8. Do you have any difficulty performing your everyday activities?NO  Are you having any difficulty walking, taking medications on your own, and or difficulty managing daily home needs?No  Do you have difficulty walking or climbing stairs?No  Do you have difficulty dressing or bathing?NO  Do you have difficulty doing errands alone such as visiting a doctor's office or shopping?No  Do you currently have any difficulty preparing food and eating?NO  Do you currently have any difficulty using the toilet?NO  Do you have any difficulty managing your finances?No  Do you have any difficulties with housekeeping of managing your housekeeping?NO    Do you have Advanced Directives in place (Living Will, Healthcare Power or Attorney)? No    Last eye Exam and location?4 years ago    Do you currently use prescribed or non-prescribed narcotic or opioid pain medications?No   Do you have a history or close family history of breast, ovarian, tubal or peritoneal cancer or a family member with BRCA (breast cancer susceptibility 1 and 2) gene mutations?yes, mother had ovarian cancer    Nurse/Assistant Credentials/time stamp: Leslie Boyer CMA 12:52pm     ----------------------------------------------------------------------------------------------------------------------------------------------------------------------------------------------------------------------  Because this visit was a virtual/telehealth visit, some criteria may be missing or patient reported. Any vitals not documented were not able to be obtained and vitals that have been documented are patient reported.    MEDICARE ANNUAL PREVENTIVE VISIT WITH PROVIDER: (  Welcome to [employer], initial annual wellness or annual wellness exam)  Virtual Visit via Video Note  I connected with Leslie Boyer on  12/02/23 by a video enabled telemedicine application and verified that I am speaking with the correct person using two identifiers.  Location patient: car and work, [place] Location provider:work or home office Persons participating in the virtual visit: patient, provider  Concerns and/or follow up today: no new health concerns   See HM section in Epic for other details of completed HM.    ROS: negative for report of fevers, unintentional weight loss, vision changes, vision loss, hearing loss or change, chest pain, sob, hemoptysis, melena, hematochezia, hematuria, falls, bleeding or bruising, thoughts of suicide or self harm, memory loss  Patient-completed extensive health risk assessment - reviewed and discussed with the patient: See Health Risk Assessment completed with patient prior to the visit either above or in recent phone note. This was reviewed in detailed with the patient today and appropriate recommendations, orders and referrals were placed as needed per Summary below and patient instructions.   Review of Medical History: -PMH, PSH, Family History and current specialty and care providers reviewed and updated and listed below   Patient Care Team: [place], Betty G, MD as PCP - General (Family Medicine)   Past Medical History:  Diagnosis Date   Anemia    childhood   Anxiety    situational   Clotting disorder (HCC) 2000   lower leg from injury-RIGHT   Depression    situational   GERD (gastroesophageal reflux disease)    with certain foods/belching   History of tobacco abuse    Hyperlipidemia    not on med   Osteopenia    Osteoporosis     Past Surgical History:  Procedure Laterality Date   MULTIPLE TOOTH EXTRACTIONS  11/2011   TOTAL ABDOMINAL HYSTERECTOMY      Social History   Socioeconomic History   Marital status: Divorced    Spouse name: Not on file   Number of children: Not on file   Years of education: Not on file   Highest education level: Not on file   Occupational History   Not on file  Tobacco Use   Smoking status: Former    Types: Cigarettes   Smokeless tobacco: Never  Vaping Use   Vaping status: Never Used  Substance and Sexual Activity   Alcohol use: Not Currently    Alcohol/week: 1.0 standard drink of alcohol    Types: 1 Glasses of wine per week    Comment: occasionally   Drug use: No   Sexual activity: Not on file  Other Topics Concern   Not on file  Social History Narrative   Former Smoker   Alcohol use-no     Occupation:  [employer]     3 daughters   1 son   Divorced  (husband had drug problem)    Social Drivers of Corporate investment banker Strain: Low Risk  (11/22/2022)   Overall Financial Resource Strain (CARDIA)    Difficulty of Paying Living Expenses: Not hard at all  Food Insecurity: No Food Insecurity (11/22/2022)   Hunger Vital Sign    Worried About Running Out of Food in the Last Year: Never true    Ran Out of Food in the Last Year: Never true  Transportation Needs: No Transportation Needs (11/22/2022)   PRAPARE - Administrator, Civil Service (Medical): No    Lack of Transportation (Non-Medical):  No  Physical Activity: Insufficiently Active (11/22/2022)   Exercise Vital Sign    Days of Exercise per Week: 1 day    Minutes of Exercise per Session: 30 min  Stress: No Stress Concern Present (11/22/2022)   [employer] of Occupational Health - Occupational Stress Questionnaire    Feeling of Stress : Not at all  Social Connections: Moderately Integrated (11/22/2022)   Social Connection and Isolation Panel [NHANES]    Frequency of Communication with Friends and Family: More than three times a week    Frequency of Social Gatherings with Friends and Family: More than three times a week    Attends Religious Services: More than 4 times per year    Active Member of [employer] or Organizations: Yes    Attends Engineer, structural: More than 4 times per year    Marital Status: Divorced   Intimate Partner Violence: Not At Risk (11/22/2022)   Humiliation, Afraid, Rape, and Kick questionnaire    Fear of Current or Ex-Partner: No    Emotionally Abused: No    Physically Abused: No    Sexually Abused: No    Family History  Problem Relation Age of Onset   Ovarian cancer Mother        2008   Osteoporosis Mother    Dementia Father    Colon polyps Neg Hx    Colon cancer Neg Hx    Esophageal cancer Neg Hx    Rectal cancer Neg Hx    Stomach cancer Neg Hx     No current outpatient medications on file prior to visit.   No current facility-administered medications on file prior to visit.    Allergies  Allergen Reactions   Sulfa Antibiotics Itching   Sulfonamide Derivatives     REACTION: Hives   Penicillins Rash       Physical Exam Vitals requested from patient and listed below if patient had equipment and was able to obtain at home for this virtual visit: There were no vitals filed for this visit. Estimated body mass index is 22.32 kg/m as calculated from the following:   Height as of 04/25/23: 5\' 3"  (1.6 m).   Weight as of 04/25/23: 126 lb (57.2 kg).  EKG (optional): deferred due to virtual visit  GENERAL: alert, oriented, no acute distress detected, full vision exam deferred due to pandemic and/or virtual encounter   HEENT: atraumatic, conjunttiva clear, no obvious abnormalities on inspection of external nose and ears  NECK: normal movements of the head and neck  LUNGS: on inspection no signs of respiratory distress, breathing rate appears normal, no obvious gross SOB, gasping or wheezing  CV: no obvious cyanosis  MS: moves all visible extremities without noticeable abnormality  PSYCH/NEURO: pleasant and cooperative, no obvious depression or anxiety, speech and thought processing grossly intact, Cognitive function grossly intact  Flowsheet Row Office Visit [facility]/02/2023 [facility]  PHQ-9 Total Score 0            12/02/2023   12:44 PM 11/22/2022    2:17 PM 03/07/2022    3:20 PM 02/21/2021   11:46 PM 03/21/2017   11:05 AM  Depression screen PHQ 2/9  Decreased Interest 0 0 2 0 0  Down, Depressed, Hopeless 0 0 3 0 0  PHQ - 2 Score 0 0 5 0 0  Altered sleeping 0  3    Tired, decreased energy 0  1    Change in appetite 0  1  Feeling bad or failure about yourself  0  3    Trouble concentrating 0  1    Moving slowly or fidgety/restless 0  0    Suicidal thoughts 0  0    PHQ-9 Score 0  14    Difficult doing work/chores   Somewhat difficult         11/02/2021   12:46 PM 11/24/2021   10:33 AM 11/22/2022    2:18 PM 12/02/2023   12:44 PM  Fall Risk  Falls in the past year? 0  0 0  Was there an injury with Fall? 0  0 0  Fall Risk Category Calculator 0  0 0  Fall Risk Category (Retired) Low     (RETIRED) Patient Fall Risk Level Low fall risk Low fall risk    Patient at Risk for Falls Due to No Fall Risks  No Fall Risks No Fall Risks  Fall risk Follow up Falls evaluation completed  Falls prevention discussed Falls evaluation completed     SUMMARY AND PLAN:  Encounter for Medicare annual wellness exam  Discussed applicable health maintenance/preventive health measures and advised and referred or ordered per patient preferences: -discussed vaccine recs/risks - advised can get at the pharmacy if she wishes and to let us  know if she does so that we can update her record Health Maintenance  Topic Date Due   Hepatitis C Screening  Never done   Pneumonia Vaccine 61+ Years old (1 of 1 - PCV) Never done   DTaP/Tdap/Td (2 - Tdap) 01/08/2019   COVID-19 Vaccine (4 - 2024-25 season) 12/18/2023 (Originally 03/16/2023)   INFLUENZA VACCINE  02/13/2024   Medicare Annual Wellness (AWV)  12/01/2024   MAMMOGRAM  06/03/2025   Colonoscopy  04/24/2030   DEXA SCAN  Completed   Zoster Vaccines- Shingrix  Completed   HPV VACCINES  Aged Out   Meningococcal B Vaccine  Aged Out      Education and counseling on  the following was provided based on the above review of health and a plan/checklist for the patient, along with additional information discussed, was provided for the patient in the patient instructions :   -Provided counseling and plan for increased risk of falling if applicable per above screening. Reviewed and demonstrated safe balance exercises that can be done at home to improve balance and discussed exercise guidelines for adults with include balance exercises at least 3 days per week.  -Advised and counseled on a healthy lifestyle - including the importance of a healthy diet, regular physical activity, social connections  -Reviewed patient's current diet. Advised and counseled on a whole foods based healthy diet. A summary of a healthy diet was provided in the Patient Instructions.  -reviewed patient's current physical activity level and discussed exercise guidelines for adults. Discussed community resources and ideas for safe exercise at home to assist in meeting exercise guideline recommendations in a safe and healthy way.  -Advise yearly dental visits at minimum and regular eye exams -Provided information for completing advanced directives - see patient instructions   Follow up: see patient instructions     Patient Instructions  I really enjoyed getting to talk with you today! I am available on Tuesdays and Thursdays for virtual visits if you have any questions or concerns, or if I can be of any further assistance.   CHECKLIST FROM ANNUAL WELLNESS VISIT:  -Follow up (please call to schedule if not scheduled after visit):   -yearly for annual wellness visit with primary care  office  Here is a list of your preventive care/health maintenance measures and the plan for each if any are due:  PLAN For any measures below that may be due:   Health Maintenance  Topic Date Due   Hepatitis C Screening  Never done   Pneumonia Vaccine 50+ Years old (1 of 1 - PCV) Never done   DTaP/Tdap/Td  (2 - Tdap) 01/08/2019   COVID-19 Vaccine (4 - 2024-25 season) 12/18/2023 (Originally 03/16/2023)   INFLUENZA VACCINE  02/13/2024   Medicare Annual Wellness (AWV)  12/01/2024   MAMMOGRAM  06/03/2025   Colonoscopy  04/24/2030   DEXA SCAN  Completed   Zoster Vaccines- Shingrix  Completed   HPV VACCINES  Aged Out   Meningococcal B Vaccine  Aged Out    -See a dentist at least yearly  -Get your eyes checked and then per your eye specialist's recommendations  -Other issues addressed today:   -I have included below further information regarding a healthy whole foods based diet, physical activity guidelines for adults, stress management and opportunities for social connections. I hope you find this information useful.   -----------------------------------------------------------------------------------------------------------------------------------------------------------------------------------------------------------------------------------------------------------    NUTRITION: -eat real food: lots of colorful vegetables (half the plate) and fruits -5-7 servings of vegetables and fruits per day (fresh or steamed is best), exp. 2 servings of vegetables with lunch and dinner and 2 servings of fruit per day. Berries and greens such as kale and collards are great choices.  -consume on a regular basis:  fresh fruits, fresh veggies, fish, nuts, seeds, healthy oils (such as olive oil, avocado oil), whole grains (make sure for bread/pasta/crackers/etc., that the first ingredient on label contains the word "whole"), legumes. -can eat small amounts of dairy and lean meat (no larger than the palm of your hand), but avoid processed meats such as ham, bacon, lunch meat, etc. -drink water -try to avoid fast food and pre-packaged foods, processed meat, ultra processed foods/beverages (donuts, candy, etc.) -most experts advise limiting sodium to < 2300mg  per day, should limit further is any chronic  conditions such as high blood pressure, heart disease, diabetes, etc. The American Heart Association advised that < 1500mg  is is ideal -try to avoid foods/beverages that contain any ingredients with names you do not recognize  -try to avoid foods/beverages  with added sugar or sweeteners/sweets  -try to avoid sweet drinks (including diet drinks): soda, juice, Gatorade, sweet tea, power drinks, diet drinks -try to avoid white rice, white bread, pasta (unless whole grain)  EXERCISE GUIDELINES FOR ADULTS: -if you wish to increase your physical activity, do so gradually and with the approval of your doctor -STOP and seek medical care immediately if you have any chest pain, chest discomfort or trouble breathing when starting or increasing exercise  -move and stretch your body, legs, feet and arms when sitting for long periods -Physical activity guidelines for optimal health in adults: -get at least 150 minutes per week of moderate exercise (can talk, but not sing); this is about 20-30 minutes of sustained activity 5-7 days per week or two 10-15 minute episodes of sustained activity 5-7 days per week -do some muscle building/resistance training/strength training at least 2 days per week  -balance exercises 3+ days per week:   Stand somewhere where you have something sturdy to hold onto if you lose balance    1) lift up on toes, then back down, start with 5x per day and work up to 20x   2) stand and lift one leg straight  out to the side so that foot is a few inches of the floor, start with 5x each side and work up to 20x each side   3) stand on one foot, start with 5 seconds each side and work up to 20 seconds on each side  If you need ideas or help with getting more active:  -Silver sneakers https://tools.silversneakers.com  -Walk with a Doc: http://www.duncan-williams.com/  -try to include resistance (weight lifting/strength building) and balance exercises twice per week: or the following link for  ideas: http://castillo-powell.com/  BuyDucts.dk  STRESS MANAGEMENT: -can try meditating, or just sitting quietly with deep breathing while intentionally relaxing all parts of your body for 5 minutes daily -if you need further help with stress, anxiety or depression please follow up with your primary doctor or contact the wonderful folks at [facility]: [phone]  SOCIAL CONNECTIONS: -options in [place] if you wish to engage in more social and exercise related activities:  -Silver sneakers https://tools.silversneakers.com  -Walk with a Doc: http://www.duncan-williams.com/  -Check out the [facility] Active Adults 50+ section on the [place] of [employer] (hiking clubs, book clubs, cards and games, chess, exercise classes, aquatic classes and much more) - see the website for details: https://www.[place].gov/departments/parks-recreation/active-adults50  -YouTube has lots of exercise videos for different ages and abilities as well  -[facility] (a variety of indoor and outdoor inperson activities for adults). [phone]. [address]  -Virtual Online Classes (a variety of topics): see seniorplanet.org or call [phone]  -consider volunteering at a school, hospice center, church, senior center or elsewhere    ADVANCED HEALTHCARE DIRECTIVES:  [place] Advanced Directives assistance:   ExpressWeek.com.cy  Everyone should have advanced health care directives in place. This is so that you get the care you want, should you ever be in a situation where you are unable to make your own medical decisions.   From the [place] Advanced Directive Website: "Advance Health Care Directives are legal documents in which you give written instructions about your health care if, in the future, you cannot speak for  yourself.   A health care power of attorney allows you to name a person you trust to make your health care decisions if you cannot make them yourself. A declaration of a desire for a natural death (or living will) is document, which states that you desire not to have your life prolonged by extraordinary measures if you have a terminal or incurable illness or if you are in a vegetative state. An advance instruction for mental health treatment makes a declaration of instructions, information and preferences regarding your mental health treatment. It also states that you are aware that the advance instruction authorizes a mental health treatment provider to act according to your wishes. It may also outline your consent or refusal of mental health treatment. A declaration of an anatomical gift allows anyone over the age of 86 to make a gift by will, organ donor card or other document."   Please see the following website or an elder law attorney for forms, FAQs and for completion of advanced directives: [place]  Print production planner Health Care Directives Advance Health Care Directives (http://guzman.[email or web address]/)  Or copy and paste the following to your web browser: [email or web address]          Maurie Southern, DO

## 2023-12-02 NOTE — Patient Instructions (Signed)
 I really enjoyed getting to talk with you today! I am available on Tuesdays and Thursdays for virtual visits if you have any questions or concerns, or if I can be of any further assistance.   CHECKLIST FROM ANNUAL WELLNESS VISIT:  -Follow up (please call to schedule if not scheduled after visit):   -yearly for annual wellness visit with primary care office  Here is a list of your preventive care/health maintenance measures and the plan for each if any are due:  PLAN For any measures below that may be due:   Health Maintenance  Topic Date Due   Hepatitis C Screening  Never done   Pneumonia Vaccine 82+ Years old (1 of 1 - PCV) Never done   DTaP/Tdap/Td (2 - Tdap) 01/08/2019   COVID-19 Vaccine (4 - 2024-25 season) 12/18/2023 (Originally 03/16/2023)   INFLUENZA VACCINE  02/13/2024   Medicare Annual Wellness (AWV)  12/01/2024   MAMMOGRAM  06/03/2025   Colonoscopy  04/24/2030   DEXA SCAN  Completed   Zoster Vaccines- Shingrix  Completed   HPV VACCINES  Aged Out   Meningococcal B Vaccine  Aged Out    -See a dentist at least yearly  -Get your eyes checked and then per your eye specialist's recommendations  -Other issues addressed today:   -I have included below further information regarding a healthy whole foods based diet, physical activity guidelines for adults, stress management and opportunities for social connections. I hope you find this information useful.   -----------------------------------------------------------------------------------------------------------------------------------------------------------------------------------------------------------------------------------------------------------    NUTRITION: -eat real food: lots of colorful vegetables (half the plate) and fruits -5-7 servings of vegetables and fruits per day (fresh or steamed is best), exp. 2 servings of vegetables with lunch and dinner and 2 servings of fruit per day. Berries and greens such as  kale and collards are great choices.  -consume on a regular basis:  fresh fruits, fresh veggies, fish, nuts, seeds, healthy oils (such as olive oil, avocado oil), whole grains (make sure for bread/pasta/crackers/etc., that the first ingredient on label contains the word "whole"), legumes. -can eat small amounts of dairy and lean meat (no larger than the palm of your hand), but avoid processed meats such as ham, bacon, lunch meat, etc. -drink water -try to avoid fast food and pre-packaged foods, processed meat, ultra processed foods/beverages (donuts, candy, etc.) -most experts advise limiting sodium to < 2300mg  per day, should limit further is any chronic conditions such as high blood pressure, heart disease, diabetes, etc. The American Heart Association advised that < 1500mg  is is ideal -try to avoid foods/beverages that contain any ingredients with names you do not recognize  -try to avoid foods/beverages  with added sugar or sweeteners/sweets  -try to avoid sweet drinks (including diet drinks): soda, juice, Gatorade, sweet tea, power drinks, diet drinks -try to avoid white rice, white bread, pasta (unless whole grain)  EXERCISE GUIDELINES FOR ADULTS: -if you wish to increase your physical activity, do so gradually and with the approval of your doctor -STOP and seek medical care immediately if you have any chest pain, chest discomfort or trouble breathing when starting or increasing exercise  -move and stretch your body, legs, feet and arms when sitting for long periods -Physical activity guidelines for optimal health in adults: -get at least 150 minutes per week of moderate exercise (can talk, but not sing); this is about 20-30 minutes of sustained activity 5-7 days per week or two 10-15 minute episodes of sustained activity 5-7 days per week -do some  muscle building/resistance training/strength training at least 2 days per week  -balance exercises 3+ days per week:   Stand somewhere where you  have something sturdy to hold onto if you lose balance    1) lift up on toes, then back down, start with 5x per day and work up to 20x   2) stand and lift one leg straight out to the side so that foot is a few inches of the floor, start with 5x each side and work up to 20x each side   3) stand on one foot, start with 5 seconds each side and work up to 20 seconds on each side  If you need ideas or help with getting more active:  -Silver sneakers https://tools.silversneakers.com  -Walk with a Doc: http://www.duncan-williams.com/  -try to include resistance (weight lifting/strength building) and balance exercises twice per week: or the following link for ideas: http://castillo-powell.com/  BuyDucts.dk  STRESS MANAGEMENT: -can try meditating, or just sitting quietly with deep breathing while intentionally relaxing all parts of your body for 5 minutes daily -if you need further help with stress, anxiety or depression please follow up with your primary doctor or contact the wonderful folks at [facility]: [phone]  SOCIAL CONNECTIONS: -options in [place] if you wish to engage in more social and exercise related activities:  -Silver sneakers https://tools.silversneakers.com  -Walk with a Doc: http://www.duncan-williams.com/  -Check out the [facility] Active Adults 50+ section on the [place] of [employer] (hiking clubs, book clubs, cards and games, chess, exercise classes, aquatic classes and much more) - see the website for details: https://www.[place].gov/departments/parks-recreation/active-adults50  -YouTube has lots of exercise videos for different ages and abilities as well  -[facility] (a variety of indoor and outdoor inperson activities for adults). [phone]. [address]  -Virtual Online Classes (a variety of topics): see seniorplanet.org or  call 9360237514  -consider volunteering at a school, hospice center, church, senior center or elsewhere    ADVANCED HEALTHCARE DIRECTIVES:  [place] Advanced Directives assistance:   ExpressWeek.com.cy  Everyone should have advanced health care directives in place. This is so that you get the care you want, should you ever be in a situation where you are unable to make your own medical decisions.   From the [place] Advanced Directive Website: "Advance Health Care Directives are legal documents in which you give written instructions about your health care if, in the future, you cannot speak for yourself.   A health care power of attorney allows you to name a person you trust to make your health care decisions if you cannot make them yourself. A declaration of a desire for a natural death (or living will) is document, which states that you desire not to have your life prolonged by extraordinary measures if you have a terminal or incurable illness or if you are in a vegetative state. An advance instruction for mental health treatment makes a declaration of instructions, information and preferences regarding your mental health treatment. It also states that you are aware that the advance instruction authorizes a mental health treatment provider to act according to your wishes. It may also outline your consent or refusal of mental health treatment. A declaration of an anatomical gift allows anyone over the age of 62 to make a gift by will, organ donor card or other document."   Please see the following website or an elder law attorney for forms, FAQs and for completion of advanced directives: [place]  Print production planner Health Care Directives  Advance Health Care Directives ([email or web address]/)  Or copy and paste the following to your web browser: [email or web address]

## 2023-12-02 NOTE — Progress Notes (Signed)
 Patient was unable to self-report due to a lack of equipment at home via telehealth

## 2024-01-26 ENCOUNTER — Encounter: Admitting: Family Medicine

## 2024-01-26 DIAGNOSIS — Z0000 Encounter for general adult medical examination without abnormal findings: Secondary | ICD-10-CM

## 2024-01-27 ENCOUNTER — Encounter: Payer: Self-pay | Admitting: Family Medicine

## 2024-01-27 ENCOUNTER — Ambulatory Visit (INDEPENDENT_AMBULATORY_CARE_PROVIDER_SITE_OTHER): Admitting: Family Medicine

## 2024-01-27 ENCOUNTER — Ambulatory Visit: Payer: Self-pay | Admitting: Family Medicine

## 2024-01-27 VITALS — BP 122/80 | HR 94 | Temp 98.1°F | Resp 16 | Ht 63.0 in | Wt 121.2 lb

## 2024-01-27 DIAGNOSIS — E559 Vitamin D deficiency, unspecified: Secondary | ICD-10-CM

## 2024-01-27 DIAGNOSIS — Z23 Encounter for immunization: Secondary | ICD-10-CM

## 2024-01-27 DIAGNOSIS — E7801 Familial hypercholesterolemia: Secondary | ICD-10-CM | POA: Diagnosis not present

## 2024-01-27 DIAGNOSIS — Z1159 Encounter for screening for other viral diseases: Secondary | ICD-10-CM

## 2024-01-27 DIAGNOSIS — R7303 Prediabetes: Secondary | ICD-10-CM | POA: Insufficient documentation

## 2024-01-27 DIAGNOSIS — E785 Hyperlipidemia, unspecified: Secondary | ICD-10-CM

## 2024-01-27 DIAGNOSIS — Z0000 Encounter for general adult medical examination without abnormal findings: Secondary | ICD-10-CM

## 2024-01-27 LAB — COMPREHENSIVE METABOLIC PANEL WITH GFR
ALT: 14 U/L (ref 0–35)
AST: 16 U/L (ref 0–37)
Albumin: 4.6 g/dL (ref 3.5–5.2)
Alkaline Phosphatase: 46 U/L (ref 39–117)
BUN: 16 mg/dL (ref 6–23)
CO2: 30 meq/L (ref 19–32)
Calcium: 9.5 mg/dL (ref 8.4–10.5)
Chloride: 103 meq/L (ref 96–112)
Creatinine, Ser: 0.89 mg/dL (ref 0.40–1.20)
GFR: 66.94 mL/min (ref >60.00)
Glucose, Bld: 97 mg/dL (ref 70–99)
Potassium: 3.6 meq/L (ref 3.5–5.1)
Sodium: 140 meq/L (ref 135–145)
Total Bilirubin: 0.7 mg/dL (ref 0.2–1.2)
Total Protein: 7.6 g/dL (ref 6.0–8.3)

## 2024-01-27 LAB — LIPID PANEL
Cholesterol: 353 mg/dL — ABNORMAL HIGH (ref 0–200)
HDL: 79.4 mg/dL (ref >39.00)
LDL Cholesterol: 261 mg/dL — ABNORMAL HIGH (ref 0–99)
NonHDL: 273.91
Total CHOL/HDL Ratio: 4
Triglycerides: 66 mg/dL (ref 0.0–149.0)
VLDL: 13.2 mg/dL (ref 0.0–40.0)

## 2024-01-27 LAB — HEMOGLOBIN A1C: Hgb A1c MFr Bld: 6.2 % (ref 4.6–6.5)

## 2024-01-27 LAB — VITAMIN D 25 HYDROXY (VIT D DEFICIENCY, FRACTURES): VITD: 13.2 ng/mL — ABNORMAL LOW (ref 30.00–100.00)

## 2024-01-27 MED ORDER — VITAMIN D (ERGOCALCIFEROL) 1.25 MG (50000 UNIT) PO CAPS: 50000.0000 [IU] | ORAL_CAPSULE | ORAL | 0 refills | Status: AC

## 2024-01-27 NOTE — Progress Notes (Signed)
 HPI: LeslieLeslie Boyer is a 68 y.o. female, who is here today for her routine physical.  Discussed the use of AI scribe software for clinical note transcription with the patient, who gave verbal consent to proceed.  History of Present Illness Leslie Boyer is a 68 year old female with past medical history significant for hyperlipidemia, prediabetes, and vitamin D  deficiency who presents for an annual physical exam. Last CPE over a year ago. She was last seen in the office on 11/02/2021.  She engages in regular physical activity, including daily walking, stair climbing at work, and gardening. 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 She sleeps an average of six hours per night and occasionally takes naps.  She eats salads, fruits, nuts, and yogurt. She frequently eats out due to her daughter's cooking preferences.   She drinks alcohol infrequently, about every three to four months, and quit smoking in 2009 after a 30-year history of smoking one to two packs per day.  Since her last visit, she underwent a colonoscopy during which a polyp was removed.  She also consulted a gynecologist and had a urine test related to osteoporosis, though she is not on any medication for it.She does not take calcium  or vitamin D  supplements She has completed her shingles vaccine but has not had a pneumonia shot this year.  Health Maintenance  Topic Date Due   Hepatitis C Screening  Never done   COVID-19 Vaccine (1 - 2024-25 season) 02/11/2024*   DTaP/Tdap/Td vaccine (2 - Tdap) 07/23/2024*   Flu Shot  02/13/2024   Medicare Annual Wellness Visit  12/01/2024   Mammogram  06/03/2025   Colon Cancer Screening  04/24/2030   Pneumococcal Vaccine for age over 31  Completed   DEXA scan (bone density measurement)  Completed   Zoster (Shingles) Vaccine  Completed   Hepatitis B Vaccine  Aged Out   HPV Vaccine  Aged Out   Meningitis B Vaccine  Aged Out  *Topic was postponed. The  date shown is not the original due date.   Immunization History  Administered Date(s) Administered   Fluad Quad(high Dose 65+) 05/03/2022   Influenza Inj Mdck Quad Pf 04/04/2018   Influenza,inj,Quad PF,6+ Mos 04/07/2019   PNEUMOCOCCAL CONJUGATE-20 01/27/2024   Td 01/07/2009   Zoster Recombinant(Shingrix) 02/27/2021, 05/03/2022   Hyperlipidemia: Currently she is not on pharmacologic treatment, she has declined medication. Her family history includes high cholesterol in her mother and sister, but no early heart disease.  Her mother had high blood pressure and lived to 68 years old.   Lab Results  Component Value Date   CHOL 410 (H) 02/21/2021   HDL 61.20 02/21/2021   LDLCALC 332 (H) 02/21/2021   LDLDIRECT 224.3 06/08/2007   TRIG 83.0 02/21/2021   CHOLHDL 7 02/21/2021    Lab Results  Component Value Date   NA 141 02/21/2021   CL 103 02/21/2021   K 4.0 02/21/2021   CO2 27 02/21/2021   BUN 21 02/21/2021   CREATININE 0.94 02/21/2021   GFR 63.99 02/21/2021   CALCIUM  9.9 02/21/2021   ALBUMIN 4.5 02/21/2021   GLUCOSE 94 02/21/2021   Prediabetes: Negative for polydipsia, polyuria, polyphagia. Lab Results  Component Value Date   HGBA1C 6.0 02/21/2021   She has been experiencing intermittent pain in the right side of her chest under the clavicle, radiating down her arm, since November/2024. The pain improved with hemp freeze cream.  Negative for associated neck pain or upper extremity  numbness/tingling/weakness.  Review of Systems  Constitutional:  Negative for activity change, appetite change and fever.  HENT:  Negative for mouth sores, sore throat and trouble swallowing.   Eyes:  Negative for redness and visual disturbance.  Respiratory:  Negative for cough, shortness of breath and wheezing.   Cardiovascular:  Negative for chest pain and leg swelling.  Gastrointestinal:  Negative for abdominal pain, nausea and vomiting.  Endocrine: Negative for cold intolerance and heat  intolerance.  Genitourinary:  Negative for decreased urine volume, dysuria and hematuria.  Musculoskeletal:  Positive for arthralgias. Negative for gait problem.  Skin:  Negative for color change and rash.  Allergic/Immunologic: Negative for environmental allergies.  Neurological:  Negative for seizures, syncope, weakness and headaches.  Hematological:  Negative for adenopathy. Does not bruise/bleed easily.  Psychiatric/Behavioral:  Negative for confusion. The patient is not nervous/anxious.   All other systems reviewed and are negative.  No current outpatient medications on file prior to visit.   No current facility-administered medications on file prior to visit.   Past Medical History:  Diagnosis Date   Anemia    childhood   Anxiety    situational   Clotting disorder (HCC) 2000   lower leg from injury-RIGHT   Depression    situational   GERD (gastroesophageal reflux disease)    with certain foods/belching   History of tobacco abuse    Hyperlipidemia    not on med   Osteopenia    Osteoporosis    Past Surgical History:  Procedure Laterality Date   MULTIPLE TOOTH EXTRACTIONS  11/2011   TOTAL ABDOMINAL HYSTERECTOMY     Allergies  Allergen Reactions   Sulfa Antibiotics Itching   Sulfonamide Derivatives     REACTION: Hives   Penicillins Rash   Family History  Problem Relation Age of Onset   Ovarian cancer Mother        2008   Osteoporosis Mother    Dementia Father    Colon polyps Neg Hx    Colon cancer Neg Hx    Esophageal cancer Neg Hx    Rectal cancer Neg Hx    Stomach cancer Neg Hx     Social History   Socioeconomic History   Marital status: Divorced    Spouse name: Not on file   Number of children: Not on file   Years of education: Not on file   Highest education level: Not on file  Occupational History   Not on file  Tobacco Use   Smoking status: Former    Types: Cigarettes   Smokeless tobacco: Never  Vaping Use   Vaping status: Never Used   Substance and Sexual Activity   Alcohol use: Not Currently    Alcohol/week: 1.0 standard drink of alcohol    Types: 1 Glasses of wine per week    Comment: occasionally   Drug use: No   Sexual activity: Not on file  Other Topics Concern   Not on file  Social History Narrative   Former Smoker   Alcohol use-no     Occupation:  [employer]     3 daughters   1 son   Divorced  (husband had drug problem)    Social Drivers of Corporate investment banker Strain: Low Risk  (11/22/2022)   Overall Financial Resource Strain (CARDIA)    Difficulty of Paying Living Expenses: Not hard at all  Food Insecurity: No Food Insecurity (11/22/2022)   Hunger Vital Sign    Worried About Running  Out of Food in the Last Year: Never true    Ran Out of Food in the Last Year: Never true  Transportation Needs: No Transportation Needs (11/22/2022)   PRAPARE - Administrator, Civil Service (Medical): No    Lack of Transportation (Non-Medical): No  Physical Activity: Insufficiently Active (11/22/2022)   Exercise Vital Sign    Days of Exercise per Week: 1 day    Minutes of Exercise per Session: 30 min  Stress: No Stress Concern Present (11/22/2022)   [employer] of Occupational Health - Occupational Stress Questionnaire    Feeling of Stress : Not at all  Social Connections: Moderately Integrated (11/22/2022)   Social Connection and Isolation Panel    Frequency of Communication with Friends and Family: More than three times a week    Frequency of Social Gatherings with Friends and Family: More than three times a week    Attends Religious Services: More than 4 times per year    Active Member of Clubs or Organizations: Yes    Attends Banker Meetings: More than 4 times per year    Marital Status: Divorced   Vitals:   01/27/24 0738  BP: 122/80  Pulse: 94  Resp: 16  Temp: 98.1 F (36.7 C)  SpO2: 96%   Body mass index is 21.48 kg/m.  Wt Readings from Last 3 Encounters:   01/27/24 121 lb 4 oz (55 kg)  04/25/23 126 lb (57.2 kg)  03/24/23 126 lb (57.2 kg)   Physical Exam Vitals and nursing note reviewed.  Constitutional:      General: She is not in acute distress.    Appearance: She is well-developed.  HENT:     Head: Normocephalic and atraumatic.     Right Ear: Hearing, tympanic membrane, ear canal and external ear normal.     Left Ear: Hearing, tympanic membrane, ear canal and external ear normal.     Mouth/Throat:     Mouth: Mucous membranes are moist.     Pharynx: Oropharynx is clear. Uvula midline.  Eyes:     Extraocular Movements: Extraocular movements intact.     Conjunctiva/sclera: Conjunctivae normal.     Pupils: Pupils are equal, round, and reactive to light.  Neck:     Thyroid : No thyromegaly.     Trachea: No tracheal deviation.  Cardiovascular:     Rate and Rhythm: Normal rate and regular rhythm.     Pulses:          Dorsalis pedis pulses are 2+ on the right side and 2+ on the left side.     Heart sounds: No murmur heard. Pulmonary:     Effort: Pulmonary effort is normal. No respiratory distress.     Breath sounds: Normal breath sounds.  Abdominal:     Palpations: Abdomen is soft. There is no hepatomegaly or mass.     Tenderness: There is no abdominal tenderness.  Genitourinary:    Comments: Deferred to gyn. Musculoskeletal:     Comments: No signs of synovitis appreciated.  Lymphadenopathy:     Cervical: No cervical adenopathy.     Upper Body:     Right upper body: No supraclavicular adenopathy.     Left upper body: No supraclavicular adenopathy.  Skin:    General: Skin is warm.     Findings: No erythema or rash.  Neurological:     General: No focal deficit present.     Mental Status: She is alert and oriented to person, place, and  time.     Cranial Nerves: No cranial nerve deficit.     Coordination: Coordination normal.     Gait: Gait normal.     Deep Tendon Reflexes:     Reflex Scores:      Bicep reflexes are 2+ on  the right side and 2+ on the left side.      Patellar reflexes are 2+ on the right side and 2+ on the left side. Psychiatric:        Mood and Affect: Mood and affect normal.   ASSESSMENT AND PLAN: Leslie Boyer was here today annual physical examination.  Orders Placed This Encounter  Procedures   Comprehensive metabolic panel with GFR   VITAMIN D  25 Hydroxy (Vit-D Deficiency, Fractures)   Lipid panel   Hemoglobin A1c   Hepatitis C antibody   Lab Results  Component Value Date   HGBA1C 6.2 01/27/2024   Lab Results  Component Value Date   NA 140 01/27/2024   CL 103 01/27/2024   K 3.6 01/27/2024   CO2 30 01/27/2024   BUN 16 01/27/2024   CREATININE 0.89 01/27/2024   GFR 66.94 01/27/2024   CALCIUM  9.5 01/27/2024   ALBUMIN 4.6 01/27/2024   GLUCOSE 97 01/27/2024   Lab Results  Component Value Date   ALT 14 01/27/2024   AST 16 01/27/2024   ALKPHOS 46 01/27/2024   BILITOT 0.7 01/27/2024    Lab Results  Component Value Date   CHOL 353 (H) 01/27/2024   HDL 79.40 01/27/2024   LDLCALC 261 (H) 01/27/2024   LDLDIRECT 224.3 06/08/2007   TRIG 66.0 01/27/2024   CHOLHDL 4 01/27/2024   Lab Results  Component Value Date   VD25OH 13.20 (L) 01/27/2024   Routine general medical examination at a health care facility Assessment & Plan: We discussed the importance of regular physical activity and healthy diet for prevention of chronic illness and/or complications. Preventive guidelines reviewed. Vaccination updated, Prevnar 20 given today. Ca++ and vit D supplementation recommended. Continue her female preventive care with gynecologist. Next CPE in a year.   Vitamin D  deficiency, unspecified Assessment & Plan: Currently she is not on vitamin D  supplementation. Further recommendation will be given according to 25 OH vitamin D  result.  Orders: -     VITAMIN D  25 Hydroxy (Vit-D Deficiency, Fractures); Future -     Vitamin D  (Ergocalciferol ); Take 1 capsule (50,000  Units total) by mouth every 7 (seven) days for 12 doses.  Dispense: 12 capsule; Refill: 0  Hyperlipidemia, unspecified hyperlipidemia type Assessment & Plan: She is not interested in pharmacologic treatment, we discussed CV benefits of statin and adverse effects of hyperlipidemia. Most likely familial hypercholesterolemia, last LDL 332 in 02/2021. Further recommendation will be given according to lipid panel result.  Orders: -     Comprehensive metabolic panel with GFR; Future -     Lipid panel; Future -     Lipoprotein A (LPA); Future  Prediabetes Assessment & Plan: Encouraged consistency with a healthy lifestyle for diabetes prevention. Last hemoglobin A1c 6.0 in 02/2021. Further recommendation will be given according to lab results.  Orders: -     Comprehensive metabolic panel with GFR; Future -     Hemoglobin A1c; Future  Encounter for HCV screening test for low risk patient -     Hepatitis C antibody; Future  Familial hypercholesterolemia Assessment & Plan: She is not interested in pharmacologic treatment, we discussed CV benefits of statin and adverse effects of hyperlipidemia. Most likely  familial hypercholesterolemia, last LDL 332 in 02/2021. Further recommendation will be given according to lipid panel result.  Orders: -     Lipoprotein A (LPA); Future  Need for pneumococcal vaccination -     Pneumococcal conjugate vaccine 20-valent  Osteoporosis: Following with gynecologist, not on pharmacologic treatment.  In regard to right-sided upper chest pain, she reports that it has improved.  Recommend following if pain gets worse or new symptoms present. Continue topical IcyHot.  Return in 1 year (on 01/26/2025) for CPE.  Allegra Cerniglia G. [place], MD  [facility]. [place] office.

## 2024-01-27 NOTE — Assessment & Plan Note (Signed)
 Encouraged consistency with a healthy lifestyle for diabetes prevention. Last hemoglobin A1c 6.0 in 02/2021. Further recommendation will be given according to lab results.

## 2024-01-27 NOTE — Patient Instructions (Addendum)
 A few things to remember from today's visit:  Routine general medical examination at a health care facility  Vitamin D  deficiency, unspecified - Plan: VITAMIN D  25 Hydroxy (Vit-D Deficiency, Fractures)  Hyperlipidemia, unspecified hyperlipidemia type - Plan: Comprehensive metabolic panel with GFR, Lipid panel, Lipoprotein A (LPA)  Prediabetes - Plan: Comprehensive metabolic panel with GFR, Hemoglobin A1c  Encounter for HCV screening test for low risk patient - Plan: Hepatitis C antibody  Familial hypercholesterolemia - Plan: Lipoprotein A (LPA)  You need an eye exam and dental preventive care.  Do not use My Chart to request refills or for acute issues that need immediate attention. If you send a my chart message, it may take a few days to be addressed, specially if I am not in the office.  Please be sure medication list is accurate. If a new problem present, please set up appointment sooner than planned today.  Health Maintenance, Female Adopting a healthy lifestyle and getting preventive care are important in promoting health and wellness. Ask your health care provider about: The right schedule for you to have regular tests and exams. Things you can do on your own to prevent diseases and keep yourself healthy. What should I know about diet, weight, and exercise? Eat a healthy diet  Eat a diet that includes plenty of vegetables, fruits, low-fat dairy products, and lean protein. Do not eat a lot of foods that are high in solid fats, added sugars, or sodium. Maintain a healthy weight Body mass index (BMI) is used to identify weight problems. It estimates body fat based on height and weight. Your health care provider can help determine your BMI and help you achieve or maintain a healthy weight. Get regular exercise Get regular exercise. This is one of the most important things you can do for your health. Most adults should: Exercise for at least 150 minutes each week. The exercise  should increase your heart rate and make you sweat (moderate-intensity exercise). Do strengthening exercises at least twice a week. This is in addition to the moderate-intensity exercise. Spend less time sitting. Even light physical activity can be beneficial. Watch cholesterol and blood lipids Have your blood tested for lipids and cholesterol at 68 years of age, then have this test every 5 years. Have your cholesterol levels checked more often if: Your lipid or cholesterol levels are high. You are older than 68 years of age. You are at high risk for heart disease. What should I know about cancer screening? Depending on your health history and family history, you may need to have cancer screening at various ages. This may include screening for: Breast cancer. Cervical cancer. Colorectal cancer. Skin cancer. Lung cancer. What should I know about heart disease, diabetes, and high blood pressure? Blood pressure and heart disease High blood pressure causes heart disease and increases the risk of stroke. This is more likely to develop in people who have high blood pressure readings or are overweight. Have your blood pressure checked: Every 3-5 years if you are 39-41 years of age. Every year if you are 31 years old or older. Diabetes Have regular diabetes screenings. This checks your fasting blood sugar level. Have the screening done: Once every three years after age 35 if you are at a normal weight and have a low risk for diabetes. More often and at a younger age if you are overweight or have a high risk for diabetes. What should I know about preventing infection? Hepatitis B If you have a higher  risk for hepatitis B, you should be screened for this virus. Talk with your health care provider to find out if you are at risk for hepatitis B infection. Hepatitis C Testing is recommended for: Everyone born from [birth year] through 1965. Anyone with known risk factors for hepatitis C. Sexually  transmitted infections (STIs) Get screened for STIs, including gonorrhea and chlamydia, if: You are sexually active and are younger than 68 years of age. You are older than 68 years of age and your health care provider tells you that you are at risk for this type of infection. Your sexual activity has changed since you were last screened, and you are at increased risk for chlamydia or gonorrhea. Ask your health care provider if you are at risk. Ask your health care provider about whether you are at high risk for HIV. Your health care provider may recommend a prescription medicine to help prevent HIV infection. If you choose to take medicine to prevent HIV, you should first get tested for HIV. You should then be tested every 3 months for as long as you are taking the medicine. Pregnancy If you are about to stop having your period (premenopausal) and you may become pregnant, seek counseling before you get pregnant. Take 400 to 800 micrograms (mcg) of folic acid every day if you become pregnant. Ask for birth control (contraception) if you want to prevent pregnancy. Osteoporosis and menopause Osteoporosis is a disease in which the bones lose minerals and strength with aging. This can result in bone fractures. If you are 29 years old or older, or if you are at risk for osteoporosis and fractures, ask your health care provider if you should: Be screened for bone loss. Take a calcium  or vitamin D  supplement to lower your risk of fractures. Be given hormone replacement therapy (HRT) to treat symptoms of menopause. Follow these instructions at home: Alcohol use Do not drink alcohol if: Your health care provider tells you not to drink. You are pregnant, may be pregnant, or are planning to become pregnant. If you drink alcohol: Limit how much you have to: 0-1 drink a day. Know how much alcohol is in your drink. In the [place] one drink equals one 12 oz bottle of beer (355 mL), one 5 oz glass of wine (148  mL), or one 1 oz glass of hard liquor (44 mL). Lifestyle Do not use any products that contain nicotine or tobacco. These products include cigarettes, chewing tobacco, and vaping devices, such as e-cigarettes. If you need help quitting, ask your health care provider. Do not use street drugs. Do not share needles. Ask your health care provider for help if you need support or information about quitting drugs. General instructions Schedule regular health, dental, and eye exams. Stay current with your vaccines. Tell your health care provider if: You often feel depressed. You have ever been abused or do not feel safe at home. Summary Adopting a healthy lifestyle and getting preventive care are important in promoting health and wellness. Follow your health care provider's instructions about healthy diet, exercising, and getting tested or screened for diseases. Follow your health care provider's instructions on monitoring your cholesterol and blood pressure. This information is not intended to replace advice given to you by your health care provider. Make sure you discuss any questions you have with your health care provider. Document Revised: 11/20/2020 Document Reviewed: 11/20/2020 Elsevier Patient Education  2024 [employer].

## 2024-01-27 NOTE — Assessment & Plan Note (Addendum)
 We discussed the importance of regular physical activity and healthy diet for prevention of chronic illness and/or complications. Preventive guidelines reviewed. Vaccination updated, Prevnar 20 given today. Ca++ and vit D supplementation recommended. Continue her female preventive care with gynecologist. Next CPE in a year.

## 2024-01-27 NOTE — Assessment & Plan Note (Addendum)
 She is not interested in pharmacologic treatment, we discussed CV benefits of statin and adverse effects of hyperlipidemia. Most likely familial hypercholesterolemia, last LDL 332 in 02/2021. Further recommendation will be given according to lipid panel result.

## 2024-01-27 NOTE — Assessment & Plan Note (Signed)
Currently she is not on vitamin D supplementation. Further recommendation will be given according to 25 [place] vitamin D result. 

## 2024-01-28 ENCOUNTER — Other Ambulatory Visit: Payer: Self-pay

## 2024-01-28 MED ORDER — ROSUVASTATIN CALCIUM 10 MG PO TABS
10.0000 mg | ORAL_TABLET | Freq: Every day | ORAL | 3 refills | Status: AC
Start: 2024-01-28 — End: ?

## 2024-01-29 LAB — LIPOPROTEIN A (LPA): Lipoprotein (a): 393 nmol/L — ABNORMAL HIGH (ref <75)

## 2024-01-29 LAB — HEPATITIS C ANTIBODY: Hepatitis C Ab: NONREACTIVE

## 2024-02-11 ENCOUNTER — Telehealth: Payer: Self-pay

## 2024-02-11 NOTE — Telephone Encounter (Signed)
 Noted

## 2024-02-11 NOTE — Telephone Encounter (Signed)
 Copied from CRM [id number]. Topic: Clinical - Prescription Issue >> Feb 11, 2024  5:06 PM Drema MATSU wrote: Reason for CRM: Jerel from [facility] stated that medication was denied for Vitamin D  due to it being a plan exclusion on their coverage.

## 2024-03-01 ENCOUNTER — Telehealth: Payer: Self-pay

## 2024-03-01 NOTE — Telephone Encounter (Signed)
 PT called Breast Surgery Triage line to C/O Left Breast near surgical incision being red and itchy.    PT's chart was opened and reviewed.   PT has Hx Right Breast Cancer 2021. Last OV 04/16/2023. Last Mammo 03/11/2023 ([facility]).    PT was asked if she has her annual mammo 02/2024 scheduled. PT said no. Requested PT get [facility] imaging appt scheduled then call our office back to schedule F/U with NP about NEW Breast concerns.    Also advised PT to F/U with PCP as the skin redness/itching could be related to fungal infection depending on it's location on the breast and a topical cream could be prescribed pending the Mammo and Breast Surgery F/U.    PT understands and will call back for NP appt.

## 2024-03-20 ENCOUNTER — Other Ambulatory Visit (INDEPENDENT_AMBULATORY_CARE_PROVIDER_SITE_OTHER): Payer: Self-pay | Admitting: Nurse Practitioner

## 2024-03-22 ENCOUNTER — Ambulatory Visit (HOSPITAL_BASED_OUTPATIENT_CLINIC_OR_DEPARTMENT_OTHER): Payer: Self-pay | Admitting: Nurse Practitioner

## 2024-03-23 DIAGNOSIS — K080 Exfoliation of teeth due to systemic causes: Secondary | ICD-10-CM | POA: Diagnosis not present

## 2024-03-30 NOTE — Progress Notes (Unsigned)
 Reason for Visit:     [name] [name] is a 68 y.o. female who presents in follow up of her history of left DCIS diagnosed in 2021.     [name] [name] called our office on 8/18 to report that her left breast was itchy and red near her incision. Her PCP gave her a cream to use, and the symptoms have resolved. She now reports no concerns on her self exam.     She had a bilateral mammogram at [facility] on 03/22/24:  There are scattered areas of fibroglandular density.   No mammographic evidence of malignancy.  BIRADS CATEGORY 02-BENIGN FINDING        Treatment Team  Ref Prov:   [name] Lamar Bramble, MD Primary Care Physician:   [name] Lamar Bramble, MD Radiology facility:   [facility])  [phone] Breast Surgeon:   Abigail Lemma MD  [phone]   Plastic Surgeon:   none Radiation Onc:  Elora Harrison MD [phone]   [facility] [place]) Medical Onc:  Dr. Tinnie Morris        Breast Cancer Comprehensive Care Plan Summary  Date of diagnosis:   01/25/2020 Event: DCIS ECOG Performance:   Grade 0    (Fully active) Genetic Testing:   negative   Presentation:   Abnormal mammogram with microcalcifications Side:   Left Focality:    unifocal Location:   lateral region   Biologic Tumor characteristics  Tumor:   Ductal Carcinoma In-situ Grade:   nuclear grade II  Ki-67:    DCIS - not indicated Oncotype Dx:  Not indicated   Estrogen Receptor:   positive >90 % Progesterone Receptor:   positive >90 % Her-2-neu Receptor: DCIS, not indicated     Staging  Tumor Size:   DCIS Nodes: clinically/US  negative Systemic Metastases: clinically negative Stage:   Stage 0 , Tis N0 M0   Treatment  Modality Date     Surgery    04/14/20 Left partial mastectomy with ultrasound guided needle localization   Radiation   not recommended due to low Prelude   Endocrine   opted against due to minimal benefit after discussion with Dr. Morris   Chemotherapy   Not indicated   Biologic   Not indicated   Clinical trial            Review of  Systems:     Review of Systems   Constitutional: Negative.    HEENT:  Negative.     Respiratory: Negative.     Cardiovascular: Negative.    Gastrointestinal: Negative.    Endocrine: Negative.    GU/GYN: Negative.     Musculoskeletal: Negative.    Skin: Negative.    Neurological: Negative.    Hematological: Negative.    Psychiatric/Behavioral: Negative.              Physical Exam:     Vitals:    03/31/24 1102   BP: 125/68   Pulse: 68   Temp: 98 F (36.7 C)   SpO2: 97%      Physical Exam  HENT:      Head: Normocephalic.   Eyes:      Pupils: Pupils are equal, round, and reactive to light.   Pulmonary:      Effort: Pulmonary effort is normal.   Chest:   Breasts:     Right: No swelling, bleeding, inverted nipple, mass, nipple discharge, skin change or tenderness.      Left: No swelling, bleeding, inverted nipple, mass, nipple discharge,  skin change or tenderness.   Musculoskeletal:         General: Normal range of motion.      Cervical back: Normal range of motion.   Lymphadenopathy:      Upper Body:      Right upper body: No supraclavicular, axillary or pectoral adenopathy.      Left upper body: No supraclavicular, axillary or pectoral adenopathy.   Skin:     General: Skin is warm and dry.   Neurological:      Mental Status: She is alert and oriented to person, place, and time.   Psychiatric:         Mood and Affect: Mood normal.         Judgment: Judgment normal.           Assessment/Plan:       History of Left DCIS - 2021     Bilateral mammogram - September 2026  Visit in the breast center - September 2026  Routine care with PCP

## 2024-03-31 ENCOUNTER — Ambulatory Visit (HOSPITAL_BASED_OUTPATIENT_CLINIC_OR_DEPARTMENT_OTHER): Admitting: Nurse Practitioner

## 2024-03-31 ENCOUNTER — Encounter (HOSPITAL_BASED_OUTPATIENT_CLINIC_OR_DEPARTMENT_OTHER): Payer: Self-pay | Admitting: Nurse Practitioner

## 2024-03-31 VITALS — BP 125/68 | HR 68 | Temp 98.0°F | Ht 64.0 in | Wt 212.0 lb

## 2024-03-31 DIAGNOSIS — Z1231 Encounter for screening mammogram for malignant neoplasm of breast: Secondary | ICD-10-CM

## 2024-04-23 ENCOUNTER — Other Ambulatory Visit: Payer: Self-pay | Admitting: Family Medicine

## 2024-04-23 DIAGNOSIS — E559 Vitamin D deficiency, unspecified: Secondary | ICD-10-CM

## 2024-06-02 DIAGNOSIS — Z1231 Encounter for screening mammogram for malignant neoplasm of breast: Secondary | ICD-10-CM | POA: Diagnosis not present

## 2025-03-30 ENCOUNTER — Ambulatory Visit (HOSPITAL_BASED_OUTPATIENT_CLINIC_OR_DEPARTMENT_OTHER): Admitting: Nurse Practitioner
